# Patient Record
Sex: Male | Born: 1965 | Hispanic: Yes | Smoking: Never smoker
Health system: Southern US, Community
[De-identification: ages and names within clinical notes are randomized; demographics above are authoritative.]

## PROBLEM LIST (undated history)

## (undated) DIAGNOSIS — R945 Abnormal results of liver function studies: Secondary | ICD-10-CM

## (undated) DIAGNOSIS — F32A Depression, unspecified: Secondary | ICD-10-CM

## (undated) DIAGNOSIS — K7460 Unspecified cirrhosis of liver: Secondary | ICD-10-CM

## (undated) DIAGNOSIS — K219 Gastro-esophageal reflux disease without esophagitis: Secondary | ICD-10-CM

## (undated) DIAGNOSIS — R7989 Other specified abnormal findings of blood chemistry: Secondary | ICD-10-CM

## (undated) DIAGNOSIS — E7800 Pure hypercholesterolemia, unspecified: Secondary | ICD-10-CM

## (undated) DIAGNOSIS — F329 Major depressive disorder, single episode, unspecified: Secondary | ICD-10-CM

## (undated) DIAGNOSIS — I10 Essential (primary) hypertension: Secondary | ICD-10-CM

## (undated) DIAGNOSIS — E669 Obesity, unspecified: Secondary | ICD-10-CM

## (undated) DIAGNOSIS — K828 Other specified diseases of gallbladder: Secondary | ICD-10-CM

## (undated) HISTORY — DX: Abnormal results of liver function studies: R94.5

## (undated) HISTORY — DX: Other specified abnormal findings of blood chemistry: R79.89

## (undated) HISTORY — DX: Gastro-esophageal reflux disease without esophagitis: K21.9

## (undated) HISTORY — DX: Obesity, unspecified: E66.9

## (undated) HISTORY — DX: Major depressive disorder, single episode, unspecified: F32.9

## (undated) HISTORY — DX: Other specified diseases of gallbladder: K82.8

## (undated) HISTORY — DX: Essential (primary) hypertension: I10

## (undated) HISTORY — PX: OTHER SURGICAL HISTORY: SHX169

## (undated) HISTORY — DX: Depression, unspecified: F32.A

---

## 2012-01-03 ENCOUNTER — Emergency Department (INDEPENDENT_AMBULATORY_CARE_PROVIDER_SITE_OTHER)
Admission: EM | Admit: 2012-01-03 | Discharge: 2012-01-03 | Disposition: A | Discharge status: Home Health | Payer: Self-pay | Source: Home / Self Care | Attending: Emergency Medicine | Admitting: Emergency Medicine

## 2012-01-03 ENCOUNTER — Emergency Department (HOSPITAL_COMMUNITY)
Admission: EM | Admit: 2012-01-03 | Discharge: 2012-01-03 | Disposition: A | Discharge status: Home / Self Care | Payer: Self-pay | Attending: Emergency Medicine | Admitting: Emergency Medicine

## 2012-01-03 ENCOUNTER — Emergency Department (HOSPITAL_COMMUNITY): Payer: Self-pay

## 2012-01-03 ENCOUNTER — Encounter (HOSPITAL_COMMUNITY): Payer: Self-pay

## 2012-01-03 ENCOUNTER — Other Ambulatory Visit: Payer: Self-pay

## 2012-01-03 DIAGNOSIS — R61 Generalized hyperhidrosis: Secondary | ICD-10-CM | POA: Insufficient documentation

## 2012-01-03 DIAGNOSIS — R748 Abnormal levels of other serum enzymes: Secondary | ICD-10-CM

## 2012-01-03 DIAGNOSIS — R1110 Vomiting, unspecified: Secondary | ICD-10-CM | POA: Insufficient documentation

## 2012-01-03 DIAGNOSIS — R7402 Elevation of levels of lactic acid dehydrogenase (LDH): Secondary | ICD-10-CM | POA: Insufficient documentation

## 2012-01-03 DIAGNOSIS — H11419 Vascular abnormalities of conjunctiva, unspecified eye: Secondary | ICD-10-CM | POA: Insufficient documentation

## 2012-01-03 DIAGNOSIS — R259 Unspecified abnormal involuntary movements: Secondary | ICD-10-CM | POA: Insufficient documentation

## 2012-01-03 DIAGNOSIS — R0602 Shortness of breath: Secondary | ICD-10-CM | POA: Insufficient documentation

## 2012-01-03 DIAGNOSIS — E7800 Pure hypercholesterolemia, unspecified: Secondary | ICD-10-CM | POA: Insufficient documentation

## 2012-01-03 DIAGNOSIS — F1020 Alcohol dependence, uncomplicated: Secondary | ICD-10-CM

## 2012-01-03 DIAGNOSIS — Z7289 Other problems related to lifestyle: Secondary | ICD-10-CM | POA: Insufficient documentation

## 2012-01-03 DIAGNOSIS — R079 Chest pain, unspecified: Secondary | ICD-10-CM | POA: Insufficient documentation

## 2012-01-03 DIAGNOSIS — F10239 Alcohol dependence with withdrawal, unspecified: Secondary | ICD-10-CM

## 2012-01-03 DIAGNOSIS — R7401 Elevation of levels of liver transaminase levels: Secondary | ICD-10-CM | POA: Insufficient documentation

## 2012-01-03 HISTORY — DX: Pure hypercholesterolemia, unspecified: E78.00

## 2012-01-03 LAB — DIFFERENTIAL
Basophils Absolute: 0 10*3/uL (ref 0.0–0.1)
Basophils Relative: 0 % (ref 0–1)
Eosinophils Absolute: 0 10*3/uL (ref 0.0–0.7)
Eosinophils Relative: 0 % (ref 0–5)
Monocytes Absolute: 0.7 10*3/uL (ref 0.1–1.0)
Monocytes Relative: 11 % (ref 3–12)
Neutro Abs: 4.9 10*3/uL (ref 1.7–7.7)

## 2012-01-03 LAB — CBC
HCT: 41.9 % (ref 39.0–52.0)
MCH: 31.6 pg (ref 26.0–34.0)
MCHC: 33.9 g/dL (ref 30.0–36.0)
RDW: 13.9 % (ref 11.5–15.5)

## 2012-01-03 LAB — CARDIAC PANEL(CRET KIN+CKTOT+MB+TROPI)
CK, MB: 2.5 ng/mL (ref 0.3–4.0)
Relative Index: 0.6 (ref 0.0–2.5)
Total CK: 397 U/L — ABNORMAL HIGH (ref 7–232)
Troponin I: <0.3 ng/mL (ref <0.30)

## 2012-01-03 LAB — COMPREHENSIVE METABOLIC PANEL
AST: 80 U/L — ABNORMAL HIGH (ref 0–37)
Albumin: 4.2 g/dL (ref 3.5–5.2)
BUN: 12 mg/dL (ref 6–23)
Calcium: 9.3 mg/dL (ref 8.4–10.5)
Chloride: 96 mEq/L (ref 96–112)
Creatinine, Ser: 0.84 mg/dL (ref 0.50–1.35)
Total Protein: 8.3 g/dL (ref 6.0–8.3)

## 2012-01-03 LAB — RAPID URINE DRUG SCREEN, HOSP PERFORMED
Barbiturates: NOT DETECTED
Benzodiazepines: NOT DETECTED

## 2012-01-03 LAB — D-DIMER, QUANTITATIVE: D-Dimer, Quant: 0.5 ug/mL-FEU — ABNORMAL HIGH (ref 0.00–0.48)

## 2012-01-03 LAB — ETHANOL: Alcohol, Ethyl (B): <11 mg/dL (ref 0–11)

## 2012-01-03 MED ORDER — LORAZEPAM 2 MG/ML IJ SOLN
1.0000 mg | Freq: Once | INTRAMUSCULAR | Status: AC
  Administered 2012-01-03: 1 mg via INTRAMUSCULAR

## 2012-01-03 MED ORDER — ALBUTEROL SULFATE (5 MG/ML) 0.5% IN NEBU
2.5000 mg | INHALATION_SOLUTION | Freq: Once | RESPIRATORY_TRACT | Status: AC
  Administered 2012-01-03: 2.5 mg via RESPIRATORY_TRACT

## 2012-01-03 MED ORDER — LORAZEPAM 2 MG/ML IJ SOLN
INTRAMUSCULAR | Status: AC
  Filled 2012-01-03: qty 1

## 2012-01-03 MED ORDER — LORAZEPAM 2 MG/ML IJ SOLN
2.0000 mg | Freq: Once | INTRAMUSCULAR | Status: AC
  Administered 2012-01-03: 2 mg via INTRAVENOUS
  Filled 2012-01-03: qty 1

## 2012-01-03 MED ORDER — SODIUM CHLORIDE 0.9 % IV BOLUS (SEPSIS)
1000.0000 mL | Freq: Once | INTRAVENOUS | Status: AC
  Administered 2012-01-03: 1000 mL via INTRAVENOUS

## 2012-01-03 MED ORDER — ALBUTEROL SULFATE (5 MG/ML) 0.5% IN NEBU
INHALATION_SOLUTION | RESPIRATORY_TRACT | Status: AC
  Filled 2012-01-03: qty 0.5

## 2012-01-03 NOTE — ED Notes (Signed)
Dr Ladon Applebaum spoke with Shawna Orleans RN at [facility] who is aware to be expecting pt arrival.  Pt departed UC with family who will transport to [facility].

## 2012-01-03 NOTE — ED Provider Notes (Signed)
History     CSN: [id number]  Arrival date & time 01/03/12  5784   First MD Initiated Contact with Patient 01/03/12 0830      Chief Complaint  Patient presents with  . Chest Pain    (Consider location/radiation/quality/duration/timing/severity/associated sxs/prior treatment) HPI Comments: Patient presents urgent care this morning complaining of left-sided chest pain burning in character associated with the feeling nauseous and dizzy with 2-3 episodes of vomiting. Patient also complaining of feeling short of breath and shaking all over. He describes that he was in a barbecue activity yesterday will he was consuming alcohol but had about 6-7 shots. (Father provided more information and describe the patient has been drinking for 3 days and that this is becoming a frequent event)  Family members around him have noticed that his consumption of alcohol has increased and when he goes hours without drinking he gets moody. Patient is complaining also of a headache. Patient described that he couldn't sleep last night he was jittery and shaking all over the whole night.   As patient presents to urgent care with concerns of chest pains and shortness of breath initial assessments were targeting the potential presence of a coronary event where EKG revealed no acute signs of a coronary event. Further during his observation monitor was noted that patient had other symptoms and a half been drinking recurrently for the last couple days. Patient looks anxious and with rest tremors predominantly on his upper extremities.   Patient is a 46 y.o. male presenting with chest pain. The history is provided by the patient.  Chest Pain The chest pain began 6 - 12 hours ago. Chest pain occurs constantly. The pain is associated with breathing. At its most intense, the pain is at 7/10. The quality of the pain is described as burning. The pain radiates to the precordial region. Chest pain is worsened by certain positions.  Primary symptoms include shortness of breath, nausea, vomiting and dizziness. Pertinent negatives for primary symptoms include no fever, no fatigue, no cough, no wheezing and no altered mental status.  Dizziness also occurs with nausea, vomiting, weakness and diaphoresis.  Associated symptoms include diaphoresis and weakness. He tried nothing for the symptoms. Risk factors include alcohol intake.  Pertinent negatives for past medical history include no anxiety/panic attacks, no arrhythmia, no MI, no spontaneous pneumothorax and no strokes.     Past Medical History  Diagnosis Date  . Hypercholesteremia     History reviewed. No pertinent past surgical history.  No family history on file.  History  Substance Use Topics  . Smoking status: Never Smoker   . Smokeless tobacco: Not on file  . Alcohol Use: Yes     6 shots of whiskey per day.        Review of Systems  Constitutional: Positive for diaphoresis. Negative for fever, chills, activity change, appetite change and fatigue.  Respiratory: Positive for shortness of breath. Negative for cough, chest tightness and wheezing.   Cardiovascular: Positive for chest pain.  Gastrointestinal: Positive for nausea and vomiting.  Neurological: Positive for dizziness, tremors, weakness and headaches. Negative for light-headedness.  Psychiatric/Behavioral: Positive for sleep disturbance, decreased concentration and agitation. Negative for altered mental status. The patient is nervous/anxious and is hyperactive.     Allergies  Review of patient's allergies indicates no known allergies.  Home Medications  No current outpatient prescriptions on file.  BP 159/92  Pulse 117  Temp(Src) 98.7 F (37.1 C) (Oral)  Resp 22  Ht 5\' 9"  (1.753  m)  Wt 240 lb (108.863 kg)  BMI 35.44 kg/m2  SpO2 96%  Physical Exam  Nursing note and vitals reviewed. Constitutional: He appears distressed. He is not intubated.  HENT:  Head: Normocephalic.  Eyes: No  foreign bodies found. Right conjunctiva is injected. Left conjunctiva is injected.  Neck: Trachea normal. No JVD present. Normal range of motion present.  Cardiovascular: Regular rhythm.   No extrasystoles are present. Tachycardia present.        EKG reveals sinus tachycardia at the ranges from 100-106 normal intervals and segments some isolated PACs  Pulmonary/Chest: Effort normal and breath sounds normal. No accessory muscle usage. No apnea. He is not intubated. No respiratory distress. He has no decreased breath sounds. He has no wheezes. He has no rhonchi. He has no rales. He exhibits tenderness. He exhibits no mass, no crepitus, no deformity, no swelling and no retraction.    Abdominal: Soft. Normal appearance. There is no hepatosplenomegaly. There is no tenderness. There is no rigidity, no rebound and no guarding.    ED Course  Procedures (including critical care time)  Labs Reviewed - No data to display No results found.   1. Alcohol dependence with withdrawal       MDM  Discussed case with Melanie at the psych unit- patient exhibiting signs of alcohol withdrawal scored about 20 points and the CIWA score. At this point I doubt that his chest discomfort is related to a coronary event his pattern of discomfort is more a paresthesia-type symptom with marked left chest wall discomfort with mild palpation.        Jimmie Molly, MD 01/03/12 [phone]

## 2012-01-03 NOTE — Discharge Instructions (Signed)
Please read and follow all provided instructions.  Your diagnoses today include:  1. Shortness of breath   2. Chest pain   3. Alcohol use     Tests performed today include:  Chest x-ray - normal  Blood tests for the heart - normal  EKG - normal  Vital signs. See below for your results today.   Medications prescribed:   None  Home care instructions:  Hydrate well for the next several days and get rest.  Avoid alcohol.   Follow-up instructions: Please follow-up with your primary care provider in the next 3 days for further evaluation of your symptoms. If you do not have a primary care doctor -- see below for referral information.   Return instructions:   Please return to the Emergency Department if you experience worsening symptoms.   Please return if you have any other emergent concerns.  Additional Information:  Your vital signs today were: BP 154/98  Pulse 101  Temp(Src) 98.1 F (36.7 C) (Oral)  Resp 23  SpO2 100% If your blood pressure (BP) was elevated above 135/85 this visit, please have this repeated by your doctor within one month. -------------- No Primary Care Doctor Call [facility]  [phone] Other agencies that provide inexpensive medical care    [facility]  [phone]    [facility]  [phone]    [facility]  [phone]    [facility]  [phone]    [facility]  [phone]    [facility]  [phone] -------------- RESOURCE GUIDE:  Dental Problems  Patients with Medicaid: [facility] [phone] [address]                                            [phone] [address] Phone:  [phone]                                                   Phone:  [phone]  If unable to pay or uninsured, contact:  [facility] or [facility]. to become qualified for the adult dental clinic.  Chronic Pain Problems Contact [facility]  [phone] Patients need to be referred by their primary care doctor.  Insufficient Money for Medicine Contact [employer]:  call "211" or [facility] [phone].  Psychological Services [facility]  [phone] [facility]  [phone] [facility]   [phone] (emergency services [phone])  Substance Abuse Resources [facility]  [phone] [facility] [phone] [facility] [place] [phone] Floydene Flock [phone] Residential & Outpatient Substance Abuse Program  [phone]  Abuse/Neglect [facility] Child Abuse Hotline 671-760-0405 [facility] Child Abuse Hotline [phone] (After Hours)  Emergency Shelter [facility] [phone]  M[facility] Homes [facility] [phone] [place] [facility] [phone]  [facility]  [facility] [facility] [address] [place]                       [address]      [address]  [place]                                                [place]                            [place] Phone:  [phone]                                   Phone:  [phone]                 Phone:  [phone]  [place] [facility] Phone:  [phone]  [place] Child Abuse Hotline [phone] (After Hours)    

## 2012-01-03 NOTE — ED Notes (Signed)
Patient reports that he began having chest pain with N/V, dizziness, SOB, weakness, and diaphoresis at 2000 01/02/12. Patient reports that the chest pain was so bad that he could not sleep. Patient decided to go to Urgent Care this AM because the pain did not go away. Patient was told to come to the ED.

## 2012-01-03 NOTE — ED Provider Notes (Signed)
Medical screening examination/treatment/procedure(s) were conducted as a shared visit with non-physician practitioner(s) and myself.  I personally evaluated the patient during the encounter  Flint Melter, MD 01/03/12 2050

## 2012-01-03 NOTE — Progress Notes (Signed)
During 01/03/12 visit Pt listed as self pay with no insurance coverage Pt confirms he is self pay [place] resident.  CM and [facility] community liaison spoke with him Pt offered [facility] services to assist with finding a [place] self pay provider Referred to general medical

## 2012-01-03 NOTE — Discharge Instructions (Signed)
   We discussed at length the need for you to meet with our behavioral team today. As your exhibiting some signs of alcohol withdrawal, there is a need to observe you and monitor for a nonspecific amount of time. We have have informed our colleagues of your arrival shortly to the [facility] long psychiatric unit.    Alcohol Withdrawal Alcohol withdrawal happens when you normally drink alcohol a lot and suddenly stop drinking. Alcohol withdrawal symptoms can be mild to very bad. Mild withdrawal symptoms can include feeling sick to your stomach (nauseous), headache, or feeling irritable. Bad withdrawal symptoms can include shakiness, being very nervous (anxious), and not thinking clearly.  HOME CARE  Join an alcohol support group.   Stay away from people or situations that make you want to drink.   Eat a healthy diet. Eat a lot of fresh fruits, vegetables, and lean meats.  GET HELP RIGHT AWAY IF:   You become confused. You start to see and hear things that are not really there.   You feel your heart beating very fast.   You throw up (vomit) blood or cannot stop throwing up. This may be bright red or look like black coffee grounds.   You have blood in your poop (stool). This may be bright red, maroon colored, or black and tarry.   You are lightheaded or pass out (faint).   You develop a fever.  MAKE SURE YOU:   Understand these instructions.   Will watch your condition.   Will get help right away if you are not doing well or get worse.  Document Released: 02/02/2008 Document Revised: 08/05/2011 Document Reviewed: 02/02/2008 [facility] Patient Information 2012 [place], [place].

## 2012-01-03 NOTE — Progress Notes (Signed)
ED Cm left voice message for spanish interpreter at [phone] at 1230 but no response noted

## 2012-01-03 NOTE — ED Provider Notes (Signed)
Medical screening examination/treatment/procedure(s) were conducted as a shared visit with non-physician practitioner(s) and myself.  I personally evaluated the patient during the encounter  Patient is alert, non-tremulous and cooperative.  Repeat vitals normal Lungs clear. Cor- mild tachycardia Neuro-nonfocal. A&O x 3   Flint Melter, MD 01/03/12 1536

## 2012-01-03 NOTE — ED Notes (Signed)
Pt continues to have tremors.  Pt admits to drinking 5-6 whiskey drinks daily.  Father present in room and states his consumption is far greater.

## 2012-01-03 NOTE — ED Provider Notes (Signed)
History     CSN: [id number]  Arrival date & time 01/03/12  1003   First MD Initiated Contact with Patient 01/03/12 1042      Chief Complaint  Patient presents with  . Shortness of Breath  . Chest Pain    (Consider location/radiation/quality/duration/timing/severity/associated sxs/prior treatment) HPI Comments: Patient sent from [facility] with complaint of shortness of breath and chest pain. Patient states he has been feeling the symptoms to some extent for approximately one week however they became worse this morning. At urgent care, history was suspicious for alcohol withdrawal. Patient is having shortness of breath, shakiness, sweating, and chest pain. He had several episodes of vomiting this morning. Patient admits to drinking a large amount of alcohol (12 oz of whiskey) yesterday and 2 days ago. Patient states that he does not drink every day. He denies recent immobilizations, leg swelling, history of blood clots. Denies history of cardiac problems. Patient states he had a lung problem approximately 20 years ago when he lived in [place] however does not know what this was.  Patient is a 46 y.o. male presenting with shortness of breath and chest pain. The history is provided by the patient.  Shortness of Breath  The current episode started today. The onset was gradual. The problem has been unchanged. The symptoms are relieved by nothing. The symptoms are aggravated by nothing. Associated symptoms include chest pain and shortness of breath. Pertinent negatives include no fever, no rhinorrhea, no sore throat, no cough and no wheezing.  Chest Pain Primary symptoms include shortness of breath. Pertinent negatives for primary symptoms include no fever, no cough, no wheezing, no abdominal pain, no nausea and no vomiting.     Past Medical History  Diagnosis Date  . Hypercholesteremia     History reviewed. No pertinent past surgical history.  Family History  Problem Relation  Age of Onset  . Hyperlipidemia Father     History  Substance Use Topics  . Smoking status: Never Smoker   . Smokeless tobacco: Not on file  . Alcohol Use: Yes     6 shots of whiskey per day.        Review of Systems  Constitutional: Negative for fever.  HENT: Negative for sore throat and rhinorrhea.   Eyes: Negative for photophobia and visual disturbance.  Respiratory: Positive for shortness of breath. Negative for cough and wheezing.   Cardiovascular: Positive for chest pain. Negative for leg swelling.  Gastrointestinal: Negative for nausea, vomiting, abdominal pain and diarrhea.  Genitourinary: Negative for dysuria.  Musculoskeletal: Negative for myalgias.  Skin: Negative for rash.  Neurological: Negative for headaches.    Allergies  Review of patient's allergies indicates no known allergies.  Home Medications  No current outpatient prescriptions on file.  BP 155/90  Temp(Src) 98.1 F (36.7 C) (Oral)  Resp 22  SpO2 97%  Physical Exam  Nursing note and vitals reviewed. Constitutional: He is oriented to person, place, and time. He appears well-developed and well-nourished.  HENT:  Head: Normocephalic and atraumatic.  Eyes: Right eye exhibits no discharge. Left eye exhibits no discharge. Right conjunctiva is injected. Right conjunctiva has no hemorrhage. Left conjunctiva is injected. Left conjunctiva has no hemorrhage.  Neck: Normal range of motion. Neck supple.  Cardiovascular: Normal rate, regular rhythm and normal heart sounds.   No murmur heard. Pulmonary/Chest: Effort normal and breath sounds normal. No respiratory distress.  Abdominal: Soft. There is no tenderness.  Neurological: He is alert and oriented to person, place,  and time. He has normal strength. No cranial nerve deficit or sensory deficit. Coordination normal. GCS eye subscore is 4. GCS verbal subscore is 5. GCS motor subscore is 6.  Skin: Skin is warm and dry.  Psychiatric: He has a normal mood and  affect.    ED Course  Procedures (including critical care time)  Labs Reviewed  CBC - Abnormal; Notable for the following:    Platelets 122 (*)    All other components within normal limits  COMPREHENSIVE METABOLIC PANEL - Abnormal; Notable for the following:    Glucose, Bld 102 (*)    AST 80 (*)    ALT 117 (*)    All other components within normal limits  D-DIMER, QUANTITATIVE - Abnormal; Notable for the following:    D-Dimer, Quant 0.50 (*)    All other components within normal limits  CARDIAC PANEL(CRET KIN+CKTOT+MB+TROPI) - Abnormal; Notable for the following:    Total CK 397 (*)    All other components within normal limits  DIFFERENTIAL  ETHANOL  URINE RAPID DRUG SCREEN (HOSP PERFORMED)   Dg Chest 2 View  01/03/2012  *RADIOLOGY REPORT*  Clinical Data: Chest pain with shortness of breath, nausea and vomiting.  CHEST - 2 VIEW  Comparison: None.  Findings: The heart size and mediastinal contours are normal. There are low lung volumes.  No confluent airspace opacity, pleural effusion or pneumothorax is seen.  There are scattered osteophytes of the thoracic spine.  No acute osseous findings are seen. Several telemetry leads overlie the chest.  IMPRESSION: Suboptimal inspiration.  No acute cardiopulmonary process.  Original Report Authenticated By: Gerrianne Scale, M.D.     1. Shortness of breath   2. Chest pain   3. Alcohol use   4. Elevated liver enzymes     11:07 AM Patient seen and examined. Work-up initiated. Medications ordered. UCC visit reviewed. Will treat for EtOH withdrawal as well as eval for chest pain.   Vital signs reviewed and are as follows: Filed Vitals:   01/03/12 1028  BP: 155/90  Temp: 98.1 F (36.7 C)  Resp: 22    Date: 01/03/2012  Rate: 110  Rhythm: sinus tachycardia  QRS Axis: normal  Intervals: normal  ST/T Wave abnormalities: normal  Conduction Disutrbances:none  Narrative Interpretation:   Old EKG Reviewed: unchanged from previous EKG  today  3:22 PM Patient was re-examined earlier. He is feeling much better after ativan. I have discussed patient and results with Dr. Effie Shy who will see patient.   Dr. Effie Shy saw patient. No further treatment indicated. Will d/c to home with referrals.  Results reviewed with patient and family. Counseled patient to avoid alcohol. Counseled to rest and hydrate well. HR was 94-98 after fluids.   Patient was counseled to return with severe chest pain, especially if the pain is crushing or pressure-like and spreads to the arms, back, neck, or jaw, or if they have sweating, nausea, or shortness of breath with the pain. They were encouraged to call 911 with these symptoms.   They were also told to return if their chest pain gets worse and does not go away with rest, they have an attack of chest pain lasting longer than usual despite rest and treatment with the medications their caregiver has prescribed, if they wake from sleep with chest pain or shortness of breath, if they feel dizzy or faint, if they have chest pain not typical of their usual pain, or if they have any other emergent concerns regarding their  health.  The patient verbalized understanding and agreed.   MDM  Patient with CP, tachycardia, SOB. Recent heavy EtOH use. Symptoms resolved with ativan. Cardiac work-up is negative. D-dimer just slightly high. DO NOT suspect PE, patient has no risk factors and has other etiology of tachycardia. Patient appears well at time of discharge. Vitals improved with ativan and fluids.        Renne Crigler, [place] 01/03/12 2035

## 2012-01-03 NOTE — ED Notes (Addendum)
PT c/o CP onset 1 week ago, worsening last night and became worse this morning.  Pt states he has felt SOB.  Pt shaking upon arrival.  EKG completed upon arrival.  Pt extremely tender to palpation in L chest.

## 2013-07-24 ENCOUNTER — Ambulatory Visit: Payer: Self-pay | Attending: Internal Medicine

## 2013-11-20 ENCOUNTER — Ambulatory Visit: Payer: Self-pay | Admitting: Internal Medicine

## 2013-12-14 ENCOUNTER — Encounter: Payer: Self-pay | Admitting: Internal Medicine

## 2013-12-14 ENCOUNTER — Ambulatory Visit: Payer: Self-pay | Attending: Internal Medicine | Admitting: Internal Medicine

## 2013-12-14 VITALS — BP 147/92 | HR 92 | Temp 99.2°F | Resp 16 | Wt 238.4 lb

## 2013-12-14 DIAGNOSIS — E7800 Pure hypercholesterolemia, unspecified: Secondary | ICD-10-CM | POA: Insufficient documentation

## 2013-12-14 DIAGNOSIS — E669 Obesity, unspecified: Secondary | ICD-10-CM | POA: Insufficient documentation

## 2013-12-14 DIAGNOSIS — IMO0001 Reserved for inherently not codable concepts without codable children: Secondary | ICD-10-CM | POA: Insufficient documentation

## 2013-12-14 DIAGNOSIS — Z008 Encounter for other general examination: Secondary | ICD-10-CM | POA: Insufficient documentation

## 2013-12-14 DIAGNOSIS — Z139 Encounter for screening, unspecified: Secondary | ICD-10-CM

## 2013-12-14 DIAGNOSIS — R030 Elevated blood-pressure reading, without diagnosis of hypertension: Secondary | ICD-10-CM | POA: Insufficient documentation

## 2013-12-14 NOTE — Progress Notes (Signed)
Patient here to establish care Takes no prescribed medications

## 2013-12-14 NOTE — Progress Notes (Signed)
Patient Demographics  Eleanor Dimichele, is a 48 y.o. male  YIR:[id number]  JKK:[id number]  DOB - 21-Sep-1965  CC:  Chief Complaint  Patient presents with  . Establish Care       HPI: Wandell Scullion is a 48 y.o. male here today to establish medical care. Patient's blood pressure is elevated patient denies any headache dizziness chest and shortness of breath, patient is trying to lose weight. Patient denies smoking cigarettes. Patient has No headache, No chest pain, No abdominal pain - No Nausea, No new weakness tingling or numbness, No Cough - SOB.  No Known Allergies Past Medical History  Diagnosis Date  . Hypercholesteremia    No current outpatient prescriptions on file prior to visit.   No current facility-administered medications on file prior to visit.   Family History  Problem Relation Age of Onset  . Hyperlipidemia Father   . Hyperlipidemia Mother    History   Social History  . Marital Status: Single    Spouse Name: N/A    Number of Children: N/A  . Years of Education: N/A   Occupational History  . Not on file.   Social History Main Topics  . Smoking status: Never Smoker   . Smokeless tobacco: Not on file  . Alcohol Use: Yes     Comment: 6 shots of whiskey per day.    . Drug Use: No  . Sexual Activity: Not on file   Other Topics Concern  . Not on file   Social History Narrative  . No narrative on file    Review of Systems: Constitutional: Negative for fever, chills, diaphoresis, activity change, appetite change and fatigue. HENT: Negative for ear pain, nosebleeds, congestion, facial swelling, rhinorrhea, neck pain, neck stiffness and ear discharge.  Eyes: Negative for pain, discharge, redness, itching and visual disturbance. Respiratory: Negative for cough, choking, chest tightness, shortness of breath, wheezing and stridor.  Cardiovascular: Negative for chest pain, palpitations and leg swelling. Gastrointestinal: Negative for abdominal  distention. Genitourinary: Negative for dysuria, urgency, frequency, hematuria, flank pain, decreased urine volume, difficulty urinating and dyspareunia.  Musculoskeletal: Negative for back pain, joint swelling, arthralgia and gait problem. Neurological: Negative for dizziness, tremors, seizures, syncope, facial asymmetry, speech difficulty, weakness, light-headedness, numbness and headaches.  Hematological: Negative for adenopathy. Does not bruise/bleed easily. Psychiatric/Behavioral: Negative for hallucinations, behavioral problems, confusion, dysphoric mood, decreased concentration and agitation.    Objective:   Filed Vitals:   12/14/13 1524  BP: 147/92  Pulse: 92  Temp: 99.2 F (37.3 C)  Resp: 16    Physical Exam: Constitutional: Obese male sitting comfortably not in acute distress HENT: Normocephalic, atraumatic, External right and left ear normal. Oropharynx is clear and moist.  Eyes: Conjunctivae and EOM are normal. PERRLA, no scleral icterus. Neck: Normal ROM. Neck supple. No JVD. No tracheal deviation. No thyromegaly. CVS: RRR, S1/S2 +, no murmurs, no gallops, no carotid bruit.  Pulmonary: Effort and breath sounds normal, no stridor, rhonchi, wheezes, rales.  Abdominal: Soft. BS +, no distension, tenderness, rebound or guarding.  Neuro: Alert. Normal reflexes, muscle tone coordination. No cranial nerve deficit. Skin: Skin is warm and dry. No rash noted. Not diaphoretic. No erythema. No pallor. Psychiatric: Normal mood and affect. Behavior, judgment, thought content normal.  Lab Results  Component Value Date   WBC 6.5 01/03/2012   HGB 14.2 01/03/2012   HCT 41.9 01/03/2012   MCV 93.1 01/03/2012   PLT 122* 01/03/2012   Lab Results  Component Value Date  CREATININE 0.84 01/03/2012   BUN 12 01/03/2012   NA 136 01/03/2012   K 3.7 01/03/2012   CL 96 01/03/2012   CO2 24 01/03/2012    No results found for this basename: HGBA1C   Lipid Panel  No results found for this basename: chol,  trig, hdl, cholhdl, vldl, ldlcalc       Assessment and plan:   1. Obesity, unspecified  Diet and exercise  2. Elevated BP Advised patient for DASH diet, exercise and lose weight.   3. Screening  Baseline fasting blood work .  - CBC with Differential; Future - COMPLETE METABOLIC PANEL WITH GFR; Future - Lipid panel; Future - TSH; Future - Vit D  25 hydroxy (rtn osteoporosis monitoring); Future   Return in about 3 months (around 03/15/2014) for hypertension.  Lorayne Marek, MD

## 2013-12-14 NOTE — Patient Instructions (Signed)
DASH Diet  The DASH diet stands for "Dietary Approaches to Stop Hypertension." It is a healthy eating plan that has been shown to reduce high blood pressure (hypertension) in as little as 14 days, while also possibly providing other significant health benefits. These other health benefits include reducing the risk of breast cancer after menopause and reducing the risk of type 2 diabetes, heart disease, colon cancer, and stroke. Health benefits also include weight loss and slowing kidney failure in patients with chronic kidney disease.   DIET GUIDELINES  · Limit salt (sodium). Your diet should contain less than 1500 mg of sodium daily.  · Limit refined or processed carbohydrates. Your diet should include mostly whole grains. Desserts and added sugars should be used sparingly.  · Include small amounts of heart-healthy fats. These types of fats include nuts, oils, and tub margarine. Limit saturated and trans fats. These fats have been shown to be harmful in the body.  CHOOSING FOODS   The following food groups are based on a 2000 calorie diet. See your Registered Dietitian for individual calorie needs.  Grains and Grain Products (6 to 8 servings daily)  · Eat More Often: Whole-wheat bread, brown rice, whole-grain or wheat pasta, quinoa, popcorn without added fat or salt (air popped).  · Eat Less Often: White bread, white pasta, white rice, cornbread.  Vegetables (4 to 5 servings daily)  · Eat More Often: Fresh, frozen, and canned vegetables. Vegetables may be raw, steamed, roasted, or grilled with a minimal amount of fat.  · Eat Less Often/Avoid: Creamed or fried vegetables. Vegetables in a cheese sauce.  Fruit (4 to 5 servings daily)  · Eat More Often: All fresh, canned (in natural juice), or frozen fruits. Dried fruits without added sugar. One hundred percent fruit juice (½ cup [237 mL] daily).  · Eat Less Often: Dried fruits with added sugar. Canned fruit in light or heavy syrup.  Lean Meats, Fish, and Poultry (2  servings or less daily. One serving is 3 to 4 oz [85-114 g]).  · Eat More Often: Ninety percent or leaner ground beef, tenderloin, sirloin. Round cuts of beef, chicken breast, turkey breast. All fish. Grill, bake, or broil your meat. Nothing should be fried.  · Eat Less Often/Avoid: Fatty cuts of meat, turkey, or chicken leg, thigh, or wing. Fried cuts of meat or fish.  Dairy (2 to 3 servings)  · Eat More Often: Low-fat or fat-free milk, low-fat plain or light yogurt, reduced-fat or part-skim cheese.  · Eat Less Often/Avoid: Milk (whole, 2%). Whole milk yogurt. Full-fat cheeses.  Nuts, Seeds, and Legumes (4 to 5 servings per week)  · Eat More Often: All without added salt.  · Eat Less Often/Avoid: Salted nuts and seeds, canned beans with added salt.  Fats and Sweets (limited)  · Eat More Often: Vegetable oils, tub margarines without trans fats, sugar-free gelatin. Mayonnaise and salad dressings.  · Eat Less Often/Avoid: Coconut oils, palm oils, butter, stick margarine, cream, half and half, cookies, candy, pie.  FOR MORE INFORMATION  The Dash Diet Eating Plan: www.dashdiet.org  Document Released: 08/05/2011 Document Revised: 11/08/2011 Document Reviewed: 08/05/2011  ExitCare® Patient Information ©2014 [employer].

## 2013-12-17 ENCOUNTER — Ambulatory Visit: Payer: Self-pay | Attending: Internal Medicine

## 2013-12-17 DIAGNOSIS — Z139 Encounter for screening, unspecified: Secondary | ICD-10-CM

## 2013-12-17 LAB — CBC WITH DIFFERENTIAL/PLATELET
BASOS PCT: 1 % (ref 0–1)
Basophils Absolute: 0.1 10*3/uL (ref 0.0–0.1)
EOS ABS: 0.2 10*3/uL (ref 0.0–0.7)
Eosinophils Relative: 2 % (ref 0–5)
HCT: 40.3 % (ref 39.0–52.0)
HEMOGLOBIN: 13.9 g/dL (ref 13.0–17.0)
Lymphocytes Relative: 15 % (ref 12–46)
Lymphs Abs: 1.2 10*3/uL (ref 0.7–4.0)
MCH: 30.8 pg (ref 26.0–34.0)
MCHC: 34.5 g/dL (ref 30.0–36.0)
MCV: 89.4 fL (ref 78.0–100.0)
MONOS PCT: 10 % (ref 3–12)
Monocytes Absolute: 0.8 10*3/uL (ref 0.1–1.0)
NEUTROS PCT: 72 % (ref 43–77)
Neutro Abs: 5.5 10*3/uL (ref 1.7–7.7)
PLATELETS: 135 10*3/uL — AB (ref 150–400)
RBC: 4.51 MIL/uL (ref 4.22–5.81)
RDW: 15.2 % (ref 11.5–15.5)
WBC: 7.7 10*3/uL (ref 4.0–10.5)

## 2013-12-17 LAB — LIPID PANEL
CHOL/HDL RATIO: 4.8 ratio
CHOLESTEROL: 206 mg/dL — AB (ref 0–200)
HDL: 43 mg/dL (ref >39)
LDL Cholesterol: 134 mg/dL — ABNORMAL HIGH (ref 0–99)
TRIGLYCERIDES: 147 mg/dL (ref <150)
VLDL: 29 mg/dL (ref 0–40)

## 2013-12-17 LAB — TSH: TSH: 2.724 u[IU]/mL (ref 0.350–4.500)

## 2013-12-17 LAB — COMPLETE METABOLIC PANEL WITH GFR
ALK PHOS: 67 U/L (ref 39–117)
ALT: 108 U/L — ABNORMAL HIGH (ref 0–53)
AST: 63 U/L — ABNORMAL HIGH (ref 0–37)
Albumin: 4.1 g/dL (ref 3.5–5.2)
BILIRUBIN TOTAL: 0.9 mg/dL (ref 0.2–1.2)
BUN: 9 mg/dL (ref 6–23)
CO2: 27 mEq/L (ref 19–32)
CREATININE: 0.93 mg/dL (ref 0.50–1.35)
Calcium: 8.8 mg/dL (ref 8.4–10.5)
Chloride: 103 mEq/L (ref 96–112)
GFR, Est African American: >89 mL/min
GLUCOSE: 76 mg/dL (ref 70–99)
Potassium: 4.5 mEq/L (ref 3.5–5.3)
SODIUM: 138 meq/L (ref 135–145)
TOTAL PROTEIN: 7.2 g/dL (ref 6.0–8.3)

## 2013-12-18 ENCOUNTER — Telehealth: Payer: Self-pay | Admitting: *Deleted

## 2013-12-18 ENCOUNTER — Other Ambulatory Visit: Payer: Self-pay | Admitting: *Deleted

## 2013-12-18 ENCOUNTER — Other Ambulatory Visit: Payer: Self-pay | Admitting: Internal Medicine

## 2013-12-18 DIAGNOSIS — E559 Vitamin D deficiency, unspecified: Secondary | ICD-10-CM

## 2013-12-18 DIAGNOSIS — R7989 Other specified abnormal findings of blood chemistry: Secondary | ICD-10-CM

## 2013-12-18 DIAGNOSIS — R945 Abnormal results of liver function studies: Secondary | ICD-10-CM

## 2013-12-18 LAB — VITAMIN D 25 HYDROXY (VIT D DEFICIENCY, FRACTURES): Vit D, 25-Hydroxy: 26 ng/mL — ABNORMAL LOW (ref 30–89)

## 2013-12-18 MED ORDER — VITAMIN D (ERGOCALCIFEROL) 1.25 MG (50000 UNIT) PO CAPS: 50000.0000 [IU] | ORAL_CAPSULE | ORAL | Status: DC

## 2013-12-18 NOTE — Telephone Encounter (Signed)
Pt is aware of his lab results. I made him an appointment for labs and prescribed the vitamin D RX

## 2013-12-18 NOTE — Telephone Encounter (Signed)
Message copied by Joan Mayans on Tue Dec 18, 2013  3:49 PM ------      Message from: Lorayne Marek      Created: Tue Dec 18, 2013 11:22 AM       Blood work reviewed, noticed low vitamin D, call patient advise to start ergocalciferol 50,000 units once a week for the duration of  12 weeks.      Also noticed abnormal liver function test, advise patient to avoid alcohol and Tylenol, I have ordered hepatitis panel, advise patient to do the blood work this week ------

## 2013-12-19 ENCOUNTER — Telehealth: Payer: Self-pay | Admitting: Internal Medicine

## 2013-12-19 NOTE — Telephone Encounter (Signed)
Pt called and left a voicemail stating that he was returning a phone call that he has received from the nurse. Pt would like to know his blood results. Please contact pt

## 2013-12-20 ENCOUNTER — Telehealth: Payer: Self-pay

## 2013-12-20 NOTE — Telephone Encounter (Signed)
Message copied by Dorothe Pea on Thu Dec 20, 2013  4:45 PM ------      Message from: Lorayne Marek      Created: Tue Dec 18, 2013 11:22 AM       Blood work reviewed, noticed low vitamin D, call patient advise to start ergocalciferol 50,000 units once a week for the duration of  12 weeks.      Also noticed abnormal liver function test, advise patient to avoid alcohol and Tylenol, I have ordered hepatitis panel, advise patient to do the blood work this week ------

## 2013-12-21 ENCOUNTER — Ambulatory Visit: Payer: Self-pay | Attending: Internal Medicine

## 2013-12-21 DIAGNOSIS — R7989 Other specified abnormal findings of blood chemistry: Secondary | ICD-10-CM

## 2013-12-21 DIAGNOSIS — R945 Abnormal results of liver function studies: Secondary | ICD-10-CM

## 2013-12-22 LAB — HEPATITIS B SURFACE ANTIBODY,QUALITATIVE: Hep B S Ab: NEGATIVE

## 2013-12-22 LAB — HEPATITIS C ANTIBODY: HCV AB: NEGATIVE

## 2013-12-22 LAB — HEPATITIS B SURFACE ANTIGEN: Hepatitis B Surface Ag: NEGATIVE

## 2013-12-31 ENCOUNTER — Telehealth: Payer: Self-pay

## 2013-12-31 NOTE — Telephone Encounter (Signed)
Patient not available Left message on voice mail to return our call 

## 2013-12-31 NOTE — Telephone Encounter (Signed)
Message copied by Dorothe Pea on Mon Dec 31, 2013  4:47 PM ------      Message from: Lorayne Marek      Created: Mon Dec 31, 2013 12:01 PM       Blood work reviewed , call and let the patient know that his hepatitis panel is negative, abnormal LFTs can be secondary to alcohol intake, advise patient to avoid alcohol and Tylenol. ------

## 2013-12-31 NOTE — Telephone Encounter (Signed)
Message copied by Dorothe Pea on Mon Dec 31, 2013  4:44 PM ------      Message from: Lorayne Marek      Created: Mon Dec 31, 2013 12:01 PM       Blood work reviewed , call and let the patient know that his hepatitis panel is negative, abnormal LFTs can be secondary to alcohol intake, advise patient to avoid alcohol and Tylenol. ------

## 2014-01-14 ENCOUNTER — Ambulatory Visit: Payer: Self-pay | Attending: Internal Medicine | Admitting: Internal Medicine

## 2014-01-14 VITALS — BP 132/88 | HR 80 | Wt 236.4 lb

## 2014-01-14 DIAGNOSIS — E559 Vitamin D deficiency, unspecified: Secondary | ICD-10-CM

## 2014-01-14 NOTE — Progress Notes (Signed)
Patient states he has been feeling dizziness, abdominal "burning" and joint aching following his Vit D dosage. Takes the dose at noon on Fridays and wakes the following AM with these symptoms and states they last 24-48 Spoke with Provider and Patient advised to stop the RX and take OTC Vit D 2000 IU QD. Patient verbalizes understanding.  Agree with RN's assessment and intervention  Lorayne Marek, MD

## 2014-03-13 ENCOUNTER — Encounter: Payer: Self-pay | Admitting: Internal Medicine

## 2014-03-13 ENCOUNTER — Ambulatory Visit: Payer: Self-pay | Attending: Internal Medicine | Admitting: Internal Medicine

## 2014-03-13 VITALS — BP 136/91 | HR 83 | Temp 98.2°F | Resp 16 | Wt 232.2 lb

## 2014-03-13 DIAGNOSIS — R7989 Other specified abnormal findings of blood chemistry: Secondary | ICD-10-CM

## 2014-03-13 DIAGNOSIS — R945 Abnormal results of liver function studies: Secondary | ICD-10-CM | POA: Insufficient documentation

## 2014-03-13 DIAGNOSIS — E669 Obesity, unspecified: Secondary | ICD-10-CM | POA: Insufficient documentation

## 2014-03-13 DIAGNOSIS — E559 Vitamin D deficiency, unspecified: Secondary | ICD-10-CM | POA: Insufficient documentation

## 2014-03-13 DIAGNOSIS — I10 Essential (primary) hypertension: Secondary | ICD-10-CM | POA: Insufficient documentation

## 2014-03-13 MED ORDER — AMLODIPINE BESYLATE 5 MG PO TABS: 5.0000 mg | ORAL_TABLET | Freq: Every day | ORAL | Status: DC

## 2014-03-13 NOTE — Progress Notes (Signed)
MRN: [id number] Name: Jesse Esparza  Sex: male Age: 48 y.o. DOB: [birth year]  Allergies: Review of patient's allergies indicates no known allergies.  Chief Complaint  Patient presents with  . Follow-up    HPI: Patient is 48 y.o. male who comes today for followup, previous blood work reviewed with the patient noticed abnormal LFTs, patient had hepatitis panel done which was negative, patient denies any abdominal pain nausea vomiting change in bowel habits, as per patient his blood pressure has been running high and systolic was and [id number] as per patient he took his moms blood pressure medication, patient is also trying to lose weight, he has lost 6 pounds compared to last visit. Patient also had low vitamin D and is taking the supplement.   Past Medical History  Diagnosis Date  . Hypercholesteremia     History reviewed. No pertinent past surgical history.    Medication List       This list is accurate as of: 03/13/14  9:32 AM.  Always use your most recent med list.               amLODipine 5 MG tablet  Commonly known as:  NORVASC  Take 1 tablet (5 mg total) by mouth daily.     Vitamin D (Ergocalciferol) 50000 UNITS Caps capsule  Commonly known as:  DRISDOL  Take 1 capsule (50,000 Units total) by mouth every 7 (seven) days.        Meds ordered this encounter  Medications  . amLODipine (NORVASC) 5 MG tablet    Sig: Take 1 tablet (5 mg total) by mouth daily.    Dispense:  90 tablet    Refill:  1     There is no immunization history on file for this patient.  Family History  Problem Relation Age of Onset  . Hyperlipidemia Father   . Hyperlipidemia Mother     History  Substance Use Topics  . Smoking status: Never Smoker   . Smokeless tobacco: Not on file  . Alcohol Use: Yes     Comment: 6 shots of whiskey per day.      Review of Systems   As noted in HPI  Filed Vitals:   03/13/14 0908  BP: 136/91  Pulse: 83  Temp: 98.2 F (36.8 C)  Resp: 16     Physical Exam  Physical Exam  Constitutional: No distress.  Eyes: EOM are normal. Pupils are equal, round, and reactive to light.  Cardiovascular: Normal rate and regular rhythm.   Pulmonary/Chest: Breath sounds normal. No respiratory distress. He has no wheezes. He has no rales.  Abdominal: Soft. There is no tenderness. There is no rebound.  Musculoskeletal: He exhibits no edema.    CBC    Component Value Date/Time   WBC 7.7 12/17/2013 0953   RBC 4.51 12/17/2013 0953   HGB 13.9 12/17/2013 0953   HCT 40.3 12/17/2013 0953   PLT 135* 12/17/2013 0953   MCV 89.4 12/17/2013 0953   LYMPHSABS 1.2 12/17/2013 0953   MONOABS 0.8 12/17/2013 0953   EOSABS 0.2 12/17/2013 0953   BASOSABS 0.1 12/17/2013 0953    CMP     Component Value Date/Time   NA 138 12/17/2013 0953   K 4.5 12/17/2013 0953   CL 103 12/17/2013 0953   CO2 27 12/17/2013 0953   GLUCOSE 76 12/17/2013 0953   BUN 9 12/17/2013 0953   CREATININE 0.93 12/17/2013 0953   CREATININE 0.84 01/03/2012 1140   CALCIUM 8.8 12/17/2013 0953  PROT 7.2 12/17/2013 0953   ALBUMIN 4.1 12/17/2013 0953   AST 63* 12/17/2013 0953   ALT 108* 12/17/2013 0953   ALKPHOS 67 12/17/2013 0953   BILITOT 0.9 12/17/2013 0953   GFRNONAA >89 12/17/2013 0953   GFRNONAA >90 01/03/2012 1140   GFRAA >89 12/17/2013 0953   GFRAA >90 01/03/2012 1140    Lab Results  Component Value Date/Time   CHOL 206* 12/17/2013  9:53 AM    No components found with this basename: hga1c    Lab Results  Component Value Date/Time   AST 63* 12/17/2013  9:53 AM    Assessment and Plan  Unspecified vitamin D deficiency Continue with vitamin D supplement.  Essential hypertension, benign - Plan:  Have started patient onamLODipine (NORVASC) 5 MG tablet, also advised for DASH diet   Abnormal LFTs  advised patient to avoid alcohol and Tylenol. His hepatitis panel is negative.  Obesity, unspecified  diet and exercise. Patient has lost 6 pounds.   Return in about 3 months (around  06/13/2014) for hypertension.  Lorayne Marek, MD

## 2014-03-13 NOTE — Patient Instructions (Signed)
DASH Eating Plan  DASH stands for "Dietary Approaches to Stop Hypertension." The DASH eating plan is a healthy eating plan that has been shown to reduce high blood pressure (hypertension). Additional health benefits may include reducing the risk of type 2 diabetes mellitus, heart disease, and stroke. The DASH eating plan may also help with weight loss.  WHAT DO I NEED TO KNOW ABOUT THE DASH EATING PLAN?  For the DASH eating plan, you will follow these general guidelines:  · Choose foods with a percent daily value for sodium of less than 5% (as listed on the food label).  · Use salt-free seasonings or herbs instead of table salt or sea salt.  · Check with your health care provider or pharmacist before using salt substitutes.  · Eat lower-sodium products, often labeled as "lower sodium" or "no salt added."  · Eat fresh foods.  · Eat more vegetables, fruits, and low-fat dairy products.  · Choose whole grains. Look for the word "whole" as the first word in the ingredient list.  · Choose fish and skinless chicken or turkey more often than red meat. Limit fish, poultry, and meat to 6 oz (170 g) each day.  · Limit sweets, desserts, sugars, and sugary drinks.  · Choose heart-healthy fats.  · Limit cheese to 1 oz (28 g) per day.  · Eat more home-cooked food and less restaurant, buffet, and fast food.  · Limit fried foods.  · Cook foods using methods other than frying.  · Limit canned vegetables. If you do use them, rinse them well to decrease the sodium.  · When eating at a restaurant, ask that your food be prepared with less salt, or no salt if possible.  WHAT FOODS CAN I EAT?  Seek help from a dietitian for individual calorie needs.  Grains  Whole grain or whole wheat bread. Brown rice. Whole grain or whole wheat pasta. Quinoa, bulgur, and whole grain cereals. Low-sodium cereals. Corn or whole wheat flour tortillas. Whole grain cornbread. Whole grain crackers. Low-sodium crackers.  Vegetables  Fresh or frozen vegetables  (raw, steamed, roasted, or grilled). Low-sodium or reduced-sodium tomato and vegetable juices. Low-sodium or reduced-sodium tomato sauce and paste. Low-sodium or reduced-sodium canned vegetables.   Fruits  All fresh, canned (in natural juice), or frozen fruits.  Meat and Other Protein Products  Ground beef (85% or leaner), grass-fed beef, or beef trimmed of fat. Skinless chicken or turkey. Ground chicken or turkey. Pork trimmed of fat. All fish and seafood. Eggs. Dried beans, peas, or lentils. Unsalted nuts and seeds. Unsalted canned beans.  Dairy  Low-fat dairy products, such as skim or 1% milk, 2% or reduced-fat cheeses, low-fat ricotta or cottage cheese, or plain low-fat yogurt. Low-sodium or reduced-sodium cheeses.  Fats and Oils  Tub margarines without trans fats. Light or reduced-fat mayonnaise and salad dressings (reduced sodium). Avocado. Safflower, olive, or canola oils. Natural peanut or almond butter.  Other  Unsalted popcorn and pretzels.  The items listed above may not be a complete list of recommended foods or beverages. Contact your dietitian for more options.  WHAT FOODS ARE NOT RECOMMENDED?  Grains  White bread. White pasta. White rice. Refined cornbread. Bagels and croissants. Crackers that contain trans fat.  Vegetables  Creamed or fried vegetables. Vegetables in a cheese sauce. Regular canned vegetables. Regular canned tomato sauce and paste. Regular tomato and vegetable juices.  Fruits  Dried fruits. Canned fruit in light or heavy syrup. Fruit juice.  Meat and Other Protein   Products  Fatty cuts of meat. Ribs, chicken wings, bacon, sausage, bologna, salami, chitterlings, fatback, hot dogs, bratwurst, and packaged luncheon meats. Salted nuts and seeds. Canned beans with salt.  Dairy  Whole or 2% milk, cream, half-and-half, and cream cheese. Whole-fat or sweetened yogurt. Full-fat cheeses or blue cheese. Nondairy creamers and whipped toppings. Processed cheese, cheese spreads, or cheese  curds.  Condiments  Onion and garlic salt, seasoned salt, table salt, and sea salt. Canned and packaged gravies. Worcestershire sauce. Tartar sauce. Barbecue sauce. Teriyaki sauce. Soy sauce, including reduced sodium. Steak sauce. Fish sauce. Oyster sauce. Cocktail sauce. Horseradish. Ketchup and mustard. Meat flavorings and tenderizers. Bouillon cubes. Hot sauce. Tabasco sauce. Marinades. Taco seasonings. Relishes.  Fats and Oils  Butter, stick margarine, lard, shortening, ghee, and bacon fat. Coconut, palm kernel, or palm oils. Regular salad dressings.  Other  Pickles and olives. Salted popcorn and pretzels.  The items listed above may not be a complete list of foods and beverages to avoid. Contact your dietitian for more information.  WHERE CAN I FIND MORE INFORMATION?  National Heart, [employer], and [employer] Institute: www.nhlbi.nih.gov/health/health-topics/topics/dash/  Document Released: 08/05/2011 Document Revised: 08/21/2013 Document Reviewed: 06/20/2013  ExitCare® Patient Information ©2015 [employer]. This information is not intended to replace advice given to you by your health care provider. Make sure you discuss any questions you have with your health care provider.

## 2014-03-13 NOTE — Progress Notes (Signed)
Patient here for follow up Is concerned his blood pressure has been running high Took two of his mom blood pressure pills about a week ago

## 2014-04-15 ENCOUNTER — Ambulatory Visit: Payer: Self-pay | Admitting: Internal Medicine

## 2014-04-25 ENCOUNTER — Ambulatory Visit: Payer: Self-pay | Attending: Internal Medicine

## 2014-07-17 ENCOUNTER — Ambulatory Visit (HOSPITAL_COMMUNITY)
Admission: RE | Admit: 2014-07-17 | Discharge: 2014-07-17 | Disposition: A | Discharge status: Home / Self Care | Payer: No Typology Code available for payment source | Source: Ambulatory Visit | Attending: Internal Medicine | Admitting: Internal Medicine

## 2014-07-17 ENCOUNTER — Encounter: Payer: Self-pay | Admitting: Internal Medicine

## 2014-07-17 ENCOUNTER — Ambulatory Visit: Payer: No Typology Code available for payment source | Attending: Internal Medicine | Admitting: Internal Medicine

## 2014-07-17 ENCOUNTER — Other Ambulatory Visit: Payer: Self-pay

## 2014-07-17 VITALS — BP 148/98 | HR 72 | Temp 98.8°F | Resp 16 | Wt 226.6 lb

## 2014-07-17 DIAGNOSIS — R200 Anesthesia of skin: Secondary | ICD-10-CM | POA: Insufficient documentation

## 2014-07-17 DIAGNOSIS — M25512 Pain in left shoulder: Secondary | ICD-10-CM | POA: Insufficient documentation

## 2014-07-17 DIAGNOSIS — R1031 Right lower quadrant pain: Secondary | ICD-10-CM | POA: Insufficient documentation

## 2014-07-17 DIAGNOSIS — R110 Nausea: Secondary | ICD-10-CM | POA: Insufficient documentation

## 2014-07-17 DIAGNOSIS — Z1211 Encounter for screening for malignant neoplasm of colon: Secondary | ICD-10-CM

## 2014-07-17 DIAGNOSIS — Z733 Stress, not elsewhere classified: Secondary | ICD-10-CM | POA: Insufficient documentation

## 2014-07-17 DIAGNOSIS — Z658 Other specified problems related to psychosocial circumstances: Secondary | ICD-10-CM

## 2014-07-17 DIAGNOSIS — R1013 Epigastric pain: Secondary | ICD-10-CM

## 2014-07-17 DIAGNOSIS — F439 Reaction to severe stress, unspecified: Secondary | ICD-10-CM

## 2014-07-17 DIAGNOSIS — R0789 Other chest pain: Secondary | ICD-10-CM | POA: Insufficient documentation

## 2014-07-17 DIAGNOSIS — R1084 Generalized abdominal pain: Secondary | ICD-10-CM

## 2014-07-17 LAB — POCT URINALYSIS DIPSTICK
Blood, UA: NEGATIVE
GLUCOSE UA: NEGATIVE
LEUKOCYTES UA: NEGATIVE
NITRITE UA: NEGATIVE
Protein, UA: 30
Spec Grav, UA: 1.015
UROBILINOGEN UA: 0.2
pH, UA: 7

## 2014-07-17 MED ORDER — OMEPRAZOLE 20 MG PO CPDR: 20.0000 mg | DELAYED_RELEASE_CAPSULE | Freq: Every day | ORAL | Status: DC

## 2014-07-17 MED ORDER — NAPROXEN 500 MG PO TABS
500.0000 mg | ORAL_TABLET | Freq: Two times a day (BID) | ORAL | Status: DC
Start: 2014-07-17 — End: 2015-01-13

## 2014-07-17 NOTE — Progress Notes (Signed)
Patient complains of having left sided chest pain that radiates To his left shoulder and down his left arm Patient states this has been going on for about a month Complains of lower abd pain and dark urine Patient also states at times he has been spitting up blood

## 2014-07-17 NOTE — Progress Notes (Signed)
MRN: [id number] Name: Jesse Esparza  Sex: male Age: 48 y.o. DOB: 1965/11/01  Allergies: Review of patient's allergies indicates no known allergies.  Chief Complaint  Patient presents with  . Follow-up    HPI: Patient is 48 y.o. male who history of hypertension comes today for followup , he reported to have lot of bloating symptoms as well as lower abdominal pain ongoing for a few weeks and also noticed dark urine also complains of some nausea but denies any vomiting?he stated some blood when he was nauseous, patient is also complaining of left-sided shoulder/chest pain, denies any fall or trauma as per patient certain movements make the symptoms worse and occasionally feels numbness tingling all the way from the shoulder down to the hand , the pain is worse at night, denies any orthopnea PND or any exertional chest pain or chest shortness of breath, denies smoking cigarettes, EKG today in the office is normal sinus rhythm with occasional PACs, patient has not taken his blood pressure medication today and his blood pressure is 148/98. As per patient he has been under a lot of stress , denies any SI or HI, patient will like to target with the social worker and the counselor does not want to take any medication.  Past Medical History  Diagnosis Date  . Hypercholesteremia     History reviewed. No pertinent past surgical history.    Medication List       This list is accurate as of: 07/17/14 10:48 AM.  Always use your most recent med list.               amLODipine 5 MG tablet  Commonly known as:  NORVASC  Take 1 tablet (5 mg total) by mouth daily.     naproxen 500 MG tablet  Commonly known as:  NAPROSYN  Take 1 tablet (500 mg total) by mouth 2 (two) times daily with a meal.     omeprazole 20 MG capsule  Commonly known as:  PRILOSEC  Take 1 capsule (20 mg total) by mouth daily.     Vitamin D (Ergocalciferol) 50000 UNITS Caps capsule  Commonly known as:  DRISDOL  Take 1  capsule (50,000 Units total) by mouth every 7 (seven) days.        Meds ordered this encounter  Medications  . naproxen (NAPROSYN) 500 MG tablet    Sig: Take 1 tablet (500 mg total) by mouth 2 (two) times daily with a meal.    Dispense:  30 tablet    Refill:  2  . omeprazole (PRILOSEC) 20 MG capsule    Sig: Take 1 capsule (20 mg total) by mouth daily.    Dispense:  30 capsule    Refill:  3     There is no immunization history on file for this patient.  Family History  Problem Relation Age of Onset  . Hyperlipidemia Father   . Hyperlipidemia Mother     History  Substance Use Topics  . Smoking status: Never Smoker   . Smokeless tobacco: Not on file  . Alcohol Use: Yes     Comment: 6 shots of whiskey per day.      Review of Systems   As noted in HPI  Filed Vitals:   07/17/14 1010  BP: 148/98  Pulse: 72  Temp: 98.8 F (37.1 C)  Resp: 16    Physical Exam  Physical Exam  Eyes: EOM are normal. Pupils are equal, round, and reactive to light.  Neck:  Neck supple.  Cardiovascular: Normal rate and regular rhythm.   Pulmonary/Chest: He has no wheezes. He has no rales.  Left chest wall tenderness with palpation   Abdominal: There is no rebound and no guarding.  RLQ tenderness with deep palapation  Musculoskeletal:  Left shoulderTenderness with palpation anteriorly good ROM    CBC    Component Value Date/Time   WBC 7.7 12/17/2013 0953   RBC 4.51 12/17/2013 0953   HGB 13.9 12/17/2013 0953   HCT 40.3 12/17/2013 0953   PLT 135* 12/17/2013 0953   MCV 89.4 12/17/2013 0953   LYMPHSABS 1.2 12/17/2013 0953   MONOABS 0.8 12/17/2013 0953   EOSABS 0.2 12/17/2013 0953   BASOSABS 0.1 12/17/2013 0953    CMP     Component Value Date/Time   NA 138 12/17/2013 0953   K 4.5 12/17/2013 0953   CL 103 12/17/2013 0953   CO2 27 12/17/2013 0953   GLUCOSE 76 12/17/2013 0953   BUN 9 12/17/2013 0953   CREATININE 0.93 12/17/2013 0953   CREATININE 0.84 01/03/2012 1140    CALCIUM 8.8 12/17/2013 0953   PROT 7.2 12/17/2013 0953   ALBUMIN 4.1 12/17/2013 0953   AST 63* 12/17/2013 0953   ALT 108* 12/17/2013 0953   ALKPHOS 67 12/17/2013 0953   BILITOT 0.9 12/17/2013 0953   GFRNONAA >89 12/17/2013 0953   GFRNONAA >90 01/03/2012 1140   GFRAA >89 12/17/2013 0953   GFRAA >90 01/03/2012 1140    Lab Results  Component Value Date/Time   CHOL 206* 12/17/2013 09:53 AM    No components found for: HGA1C  Lab Results  Component Value Date/Time   AST 63* 12/17/2013 09:53 AM    Assessment and Plan  Generalized abdominal pain - Plan:  Results for orders placed or performed in visit on 07/17/14  Urinalysis Dipstick  Result Value Ref Range   Color, UA yellow    Clarity, UA clear    Glucose, UA neg    Bilirubin, UA small    Ketones, UA trace    Spec Grav, UA 1.015    Blood, UA neg    pH, UA 7.0    Protein, UA 30    Urobilinogen, UA 0.2    Nitrite, UA neg    Leukocytes, UA Negative    Urinalysis Dipstick is negative for infection or hematuria.  Other chest pain - Plan: EKG 12-Lead shows NSR with pac  Patient has atypical chest pain with tenderness on palpation , likely musculoskeletal, trial of naproxen (NAPROSYN) 500 MG tablet   Nausea/RLQ abdominal pain - Plan: CT Abdomen Pelvis Wo Contrast  Dyspepsia - Plan: advised for lifestyle modification, trial of omeprazole (PRILOSEC) 20 MG capsule  Stress  Patient wants the resources to talk to counselor, he will speak with social worker   Left shoulder pain - Plan: naproxen (NAPROSYN) 500 MG tablet, DG Shoulder Left  Special screening for malignant neoplasms, colon - Plan: Ambulatory referral to Gastroenterology   Health Maintenance -Colonoscopy: referred to GI Patient declines flu shot   Return in about 3 months (around 10/17/2014) for hypertension.  Lorayne Marek, MD

## 2014-07-19 ENCOUNTER — Ambulatory Visit (HOSPITAL_COMMUNITY)
Admission: RE | Admit: 2014-07-19 | Discharge: 2014-07-19 | Disposition: A | Discharge status: Home / Self Care | Payer: No Typology Code available for payment source | Source: Ambulatory Visit | Attending: Internal Medicine | Admitting: Internal Medicine

## 2014-07-19 ENCOUNTER — Encounter (HOSPITAL_COMMUNITY): Payer: Self-pay

## 2014-07-19 DIAGNOSIS — R1031 Right lower quadrant pain: Secondary | ICD-10-CM | POA: Insufficient documentation

## 2014-07-22 ENCOUNTER — Other Ambulatory Visit: Payer: Self-pay

## 2014-07-31 ENCOUNTER — Encounter: Payer: Self-pay | Admitting: Gastroenterology

## 2014-08-30 HISTORY — PX: COLONOSCOPY: SHX174

## 2014-09-06 ENCOUNTER — Encounter: Payer: Self-pay | Admitting: Internal Medicine

## 2014-09-06 ENCOUNTER — Ambulatory Visit: Payer: No Typology Code available for payment source | Attending: Internal Medicine | Admitting: Internal Medicine

## 2014-09-06 VITALS — BP 129/85 | HR 75 | Temp 98.0°F | Resp 16 | Wt 228.0 lb

## 2014-09-06 DIAGNOSIS — E780 Pure hypercholesterolemia: Secondary | ICD-10-CM | POA: Insufficient documentation

## 2014-09-06 DIAGNOSIS — I10 Essential (primary) hypertension: Secondary | ICD-10-CM

## 2014-09-06 DIAGNOSIS — Z79899 Other long term (current) drug therapy: Secondary | ICD-10-CM | POA: Insufficient documentation

## 2014-09-06 DIAGNOSIS — Z23 Encounter for immunization: Secondary | ICD-10-CM

## 2014-09-06 NOTE — Progress Notes (Signed)
Patient states on Dec 30 started to not feel well Was having some epigastric pain and feeling nausea What ever he ate or drank he felt sick Denies any vomiting or diarrhea Stopped taking his prilosec since than Patient states he did not start really eating until the fourth of January Patient states he is feeling better

## 2014-09-06 NOTE — Progress Notes (Signed)
MRN: [id number] Name: Jesse Esparza  Sex: male Age: 49 y.o. DOB: [birth year]  Allergies: Review of patient's allergies indicates no known allergies.  Chief Complaint  Patient presents with  . Follow-up    HPI: Patient is 49 y.o. male who comes today for followup he has history of hypertension, one to 2 weeks ago he was having abdominal pain nausea vomiting which currently is improved, he had a CAT scan done in November which reported fatty liver and small 2 mm non obstructing renal stone, patient denies any urinary symptoms currently denies any fever chills nausea vomiting,patient wants get hepatitis B vaccination.  Past Medical History  Diagnosis Date  . Hypercholesteremia     History reviewed. No pertinent past surgical history.    Medication List       This list is accurate as of: 09/06/14  5:39 PM.  Always use your most recent med list.               amLODipine 5 MG tablet  Commonly known as:  NORVASC  Take 1 tablet (5 mg total) by mouth daily.     naproxen 500 MG tablet  Commonly known as:  NAPROSYN  Take 1 tablet (500 mg total) by mouth 2 (two) times daily with a meal.     omeprazole 20 MG capsule  Commonly known as:  PRILOSEC  Take 1 capsule (20 mg total) by mouth daily.     Vitamin D (Ergocalciferol) 50000 UNITS Caps capsule  Commonly known as:  DRISDOL  Take 1 capsule (50,000 Units total) by mouth every 7 (seven) days.        No orders of the defined types were placed in this encounter.    Immunization History  Administered Date(s) Administered  . Hepatitis B, adult/adol-2 dose 09/06/2014    Family History  Problem Relation Age of Onset  . Hyperlipidemia Father   . Hyperlipidemia Mother     History  Substance Use Topics  . Smoking status: Never Smoker   . Smokeless tobacco: Not on file  . Alcohol Use: Yes     Comment: 6 shots of whiskey per day.      Review of Systems   As noted in HPI  Filed Vitals:   09/06/14 1605  BP:  129/85  Pulse: 75  Temp: 98 F (36.7 C)  Resp: 16    Physical Exam  Physical Exam  Constitutional: No distress.  Eyes: EOM are normal. Pupils are equal, round, and reactive to light.  Cardiovascular: Normal rate and regular rhythm.   Pulmonary/Chest: Breath sounds normal. No respiratory distress. He has no wheezes. He has no rales.  Abdominal: Soft. There is no tenderness. There is no rebound.  No CVA tenderness     CBC    Component Value Date/Time   WBC 7.7 12/17/2013 0953   RBC 4.51 12/17/2013 0953   HGB 13.9 12/17/2013 0953   HCT 40.3 12/17/2013 0953   PLT 135* 12/17/2013 0953   MCV 89.4 12/17/2013 0953   LYMPHSABS 1.2 12/17/2013 0953   MONOABS 0.8 12/17/2013 0953   EOSABS 0.2 12/17/2013 0953   BASOSABS 0.1 12/17/2013 0953    CMP     Component Value Date/Time   NA 138 12/17/2013 0953   K 4.5 12/17/2013 0953   CL 103 12/17/2013 0953   CO2 27 12/17/2013 0953   GLUCOSE 76 12/17/2013 0953   BUN 9 12/17/2013 0953   CREATININE 0.93 12/17/2013 0953   CREATININE 0.84 01/03/2012 1140  CALCIUM 8.8 12/17/2013 0953   PROT 7.2 12/17/2013 0953   ALBUMIN 4.1 12/17/2013 0953   AST 63* 12/17/2013 0953   ALT 108* 12/17/2013 0953   ALKPHOS 67 12/17/2013 0953   BILITOT 0.9 12/17/2013 0953   GFRNONAA >89 12/17/2013 0953   GFRNONAA >90 01/03/2012 1140   GFRAA >89 12/17/2013 0953   GFRAA >90 01/03/2012 1140    Lab Results  Component Value Date/Time   CHOL 206* 12/17/2013 09:53 AM    No components found for: HGA1C  Lab Results  Component Value Date/Time   AST 63* 12/17/2013 09:53 AM    Assessment and Plan  Essential hypertension The pressure is well controlled, continue with Norvasc 5 mg daily.  Need for hepatitis B vaccination - Plan: Hepatitis B vaccine adult IM   Health Maintenance  -Vaccinations: Patient declines flu shot   Return in about 3 months (around 12/06/2014) for hypertension.  Lorayne Marek, MD

## 2014-09-30 ENCOUNTER — Telehealth: Payer: Self-pay | Admitting: Gastroenterology

## 2014-09-30 ENCOUNTER — Ambulatory Visit (AMBULATORY_SURGERY_CENTER): Payer: Self-pay | Admitting: *Deleted

## 2014-09-30 VITALS — Ht 67.0 in | Wt 230.2 lb

## 2014-09-30 DIAGNOSIS — Z1211 Encounter for screening for malignant neoplasm of colon: Secondary | ICD-10-CM

## 2014-09-30 MED ORDER — NA SULFATE-K SULFATE-MG SULF 17.5-3.13-1.6 GM/177ML PO SOLN: 1.0000 | Freq: Once | ORAL | Status: DC

## 2014-09-30 NOTE — Progress Notes (Signed)
Denies allergies to eggs or soy products. Denies complications with sedation or anesthesia. Denies O2 use. Denies use of diet or weight loss medications.  Emmi instructions given for colonoscopy.  

## 2014-09-30 NOTE — Progress Notes (Signed)
Pharmacy phoned in, suprep not available. Sample prep left at 4th floor for patient to pick up.

## 2014-09-30 NOTE — Telephone Encounter (Signed)
Phone call to patient to inform them that prep is not carried by pharmacy. Sample prep available and will be left at the front desk on the 4th floor for patient to pick up. Patient states understanding.

## 2014-10-07 ENCOUNTER — Ambulatory Visit: Payer: Self-pay | Attending: Internal Medicine | Admitting: *Deleted

## 2014-10-07 DIAGNOSIS — Z23 Encounter for immunization: Secondary | ICD-10-CM | POA: Insufficient documentation

## 2014-10-07 NOTE — Progress Notes (Signed)
Patient presents for second dose of Hep B vaccination States feeling well today Aware third dose is due 03/07/15

## 2014-10-14 ENCOUNTER — Ambulatory Visit (AMBULATORY_SURGERY_CENTER): Payer: Self-pay | Admitting: Gastroenterology

## 2014-10-14 ENCOUNTER — Encounter: Payer: Self-pay | Admitting: Gastroenterology

## 2014-10-14 VITALS — BP 126/81 | HR 72 | Temp 98.2°F | Resp 16 | Ht 67.0 in | Wt 228.0 lb

## 2014-10-14 DIAGNOSIS — K5730 Diverticulosis of large intestine without perforation or abscess without bleeding: Secondary | ICD-10-CM

## 2014-10-14 DIAGNOSIS — D125 Benign neoplasm of sigmoid colon: Secondary | ICD-10-CM

## 2014-10-14 DIAGNOSIS — K635 Polyp of colon: Secondary | ICD-10-CM

## 2014-10-14 DIAGNOSIS — D122 Benign neoplasm of ascending colon: Secondary | ICD-10-CM

## 2014-10-14 DIAGNOSIS — Z1211 Encounter for screening for malignant neoplasm of colon: Secondary | ICD-10-CM

## 2014-10-14 MED ORDER — SODIUM CHLORIDE 0.9 % IV SOLN: 500.0000 mL | INTRAVENOUS | Status: DC

## 2014-10-14 NOTE — Patient Instructions (Signed)
YOU HAD AN ENDOSCOPIC PROCEDURE TODAY AT THE [facility]: Refer to the procedure report that was given to you for any specific questions about what was found during the examination.  If the procedure report does not answer your questions, please call your gastroenterologist to clarify.  If you requested that your care partner not be given the details of your procedure findings, then the procedure report has been included in a sealed envelope for you to review at your convenience later.  YOU SHOULD EXPECT: Some feelings of bloating in the abdomen. Passage of more gas than usual.  Walking can help get rid of the air that was put into your GI tract during the procedure and reduce the bloating. If you had a lower endoscopy (such as a colonoscopy or flexible sigmoidoscopy) you may notice spotting of blood in your stool or on the toilet paper. If you underwent a bowel prep for your procedure, then you may not have a normal bowel movement for a few days.  DIET: Your first meal following the procedure should be a light meal and then it is ok to progress to your normal diet.  A half-sandwich or bowl of soup is an example of a good first meal.  Heavy or fried foods are harder to digest and may make you feel nauseous or bloated.  Likewise meals heavy in dairy and vegetables can cause extra gas to form and this can also increase the bloating.  Drink plenty of fluids but you should avoid alcoholic beverages for 24 hours.  ACTIVITY: Your care partner should take you home directly after the procedure.  You should plan to take it easy, moving slowly for the rest of the day.  You can resume normal activity the day after the procedure however you should NOT DRIVE or use heavy machinery for 24 hours (because of the sedation medicines used during the test).    SYMPTOMS TO REPORT IMMEDIATELY: A gastroenterologist can be reached at any hour.  During normal business hours, 8:30 AM to 5:00 PM Monday through Friday,  call [phone].  After hours and on weekends, please call the GI answering service at [phone] who will take a message and have the physician on call contact you.   Following lower endoscopy (colonoscopy or flexible sigmoidoscopy):  Excessive amounts of blood in the stool  Significant tenderness or worsening of abdominal pains  Swelling of the abdomen that is new, acute  Fever of 100F or higher  FOLLOW UP: If any biopsies were taken you will be contacted by phone or by letter within the next 1-3 weeks.  Call your gastroenterologist if you have not heard about the biopsies in 3 weeks.  Our staff will call the home number listed on your records the next business day following your procedure to check on you and address any questions or concerns that you may have at that time regarding the information given to you following your procedure. This is a courtesy call and so if there is no answer at the home number and we have not heard from you through the emergency physician on call, we will assume that you have returned to your regular daily activities without incident.  SIGNATURES/CONFIDENTIALITY: You and/or your care partner have signed paperwork which will be entered into your electronic medical record.  These signatures attest to the fact that that the information above on your After Visit Summary has been reviewed and is understood.  Full responsibility of the confidentiality of this   this discharge information lies with you and/or your care-partner.  Polyps, diverticulosis, high fiber diet-handouts given  Repeat colonoscopy will be determined by pathology.  

## 2014-10-14 NOTE — Progress Notes (Signed)
A/ox3, pleased with MAC, report to RN 

## 2014-10-14 NOTE — Progress Notes (Signed)
Called to room to assist during endoscopic procedure.  Patient ID and intended procedure confirmed with present staff. Received instructions for my participation in the procedure from the performing physician.  

## 2014-10-14 NOTE — Op Note (Signed)
[place]  [address]. [place], [id number]   COLONOSCOPY PROCEDURE REPORT  PATIENT: Jesse, Esparza  MR#: [id number] BIRTHDATE: [birth year] , 48  yrs. old GENDER: male ENDOSCOPIST: Inda Castle, MD REFERRED BY: PROCEDURE DATE:  10/14/2014 PROCEDURE:   Colonoscopy with snare polypectomy First Screening Colonoscopy - Avg.  risk and is 50 yrs.  old or older Yes.  Prior Negative Screening - Now for repeat screening. N/A  History of Adenoma - Now for follow-up colonoscopy & has been > or = to 3 yrs.  N/A  Polyps Removed Today? Yes. ASA CLASS:   Class II INDICATIONS:average risk patient for colon cancer. MEDICATIONS: Monitored anesthesia care and Propofol 320 mg IV  DESCRIPTION OF PROCEDURE:   After the risks benefits and alternatives of the procedure were thoroughly explained, informed consent was obtained.  The digital rectal exam revealed no abnormalities of the rectum.   The [id number]  endoscope was introduced through the anus and advanced to the cecum, which was identified by both the appendix and ileocecal valve. No adverse events experienced.   The quality of the prep was excellent using Suprep  The instrument was then slowly withdrawn as the colon was fully examined.      COLON FINDINGS: Two sessile polyps ranging from 2 to 39mm in size were found in the ascending colon.  A polypectomy was performed with a cold snare.  The resection was complete, the polyp tissue was completely retrieved and sent to histology.   A sessile polyp measuring 3 mm in size was found in the sigmoid colon.  A polypectomy was performed with a cold snare.  The resection was complete, the polyp tissue was completely retrieved and sent to histology.   There was mild diverticulosis noted in the sigmoid colon.  Retroflexed views revealed no abnormalities. The time to cecum=4 minutes 14 seconds.  Withdrawal time=8 minutes 01 seconds. The scope was withdrawn and the  procedure completed. COMPLICATIONS: There were no immediate complications.  ENDOSCOPIC IMPRESSION: 1.   Two sessile polyps ranging from 2 to 37mm in size were found in the ascending colon; polypectomy was performed with a cold snare 2.   Sessile polyp was found in the sigmoid colon; polypectomy was performed with a cold snare 3.   Mild diverticulosis was noted in the sigmoid colon  RECOMMENDATIONS: If all the polyp(s) removed today are proven to be adenomatous (pre-cancerous) polyps, you will need a colonoscopy in 3 years. Otherwise you should have a repeat colonoscopy in 5 years if any polyps are adenomatous.  You will receive a letter within 1-2 weeks with the results of your biopsy as well as final recommendations. Please call my office if you have not received a letter after 3 weeks.  eSigned:  Inda Castle, MD 10/14/2014 9:12 AM   cc: Lorayne Marek, MD   PATIENT NAME:  Jesse, Esparza MR#: [id number]

## 2014-10-15 ENCOUNTER — Telehealth: Payer: Self-pay | Admitting: *Deleted

## 2014-10-15 NOTE — Telephone Encounter (Signed)
  Follow up Call-  Call back number 10/14/2014  Post procedure Call Back phone  # [phone]  Permission to leave phone message No     Patient questions:  Message left to call us if necessary.

## 2014-10-18 ENCOUNTER — Encounter: Payer: Self-pay | Admitting: Gastroenterology

## 2014-10-28 ENCOUNTER — Ambulatory Visit: Payer: Self-pay

## 2014-11-20 ENCOUNTER — Ambulatory Visit: Payer: Self-pay | Attending: Internal Medicine

## 2014-12-02 ENCOUNTER — Ambulatory Visit: Payer: Self-pay | Admitting: Internal Medicine

## 2014-12-06 ENCOUNTER — Telehealth: Payer: Self-pay | Admitting: *Deleted

## 2014-12-06 ENCOUNTER — Other Ambulatory Visit: Payer: Self-pay | Admitting: Internal Medicine

## 2014-12-06 DIAGNOSIS — E559 Vitamin D deficiency, unspecified: Secondary | ICD-10-CM

## 2014-12-06 MED ORDER — VITAMIN D (ERGOCALCIFEROL) 1.25 MG (50000 UNIT) PO CAPS: 50000.0000 [IU] | ORAL_CAPSULE | ORAL | Status: DC

## 2014-12-06 NOTE — Telephone Encounter (Signed)
Refilled patients vitamin D

## 2014-12-25 ENCOUNTER — Telehealth: Payer: Self-pay | Admitting: Internal Medicine

## 2014-12-25 ENCOUNTER — Ambulatory Visit: Payer: Self-pay | Attending: Internal Medicine | Admitting: Internal Medicine

## 2014-12-25 ENCOUNTER — Ambulatory Visit (HOSPITAL_COMMUNITY)
Admission: RE | Admit: 2014-12-25 | Discharge: 2014-12-25 | Disposition: A | Discharge status: Home / Self Care | Payer: Self-pay | Source: Ambulatory Visit | Attending: Internal Medicine | Admitting: Internal Medicine

## 2014-12-25 ENCOUNTER — Encounter: Payer: Self-pay | Admitting: Internal Medicine

## 2014-12-25 ENCOUNTER — Telehealth: Payer: Self-pay

## 2014-12-25 VITALS — BP 141/97 | HR 88 | Temp 98.0°F | Resp 16 | Wt 229.6 lb

## 2014-12-25 DIAGNOSIS — M79671 Pain in right foot: Secondary | ICD-10-CM | POA: Insufficient documentation

## 2014-12-25 DIAGNOSIS — N390 Urinary tract infection, site not specified: Secondary | ICD-10-CM | POA: Insufficient documentation

## 2014-12-25 DIAGNOSIS — R1031 Right lower quadrant pain: Secondary | ICD-10-CM | POA: Insufficient documentation

## 2014-12-25 LAB — POCT URINALYSIS DIPSTICK
Glucose, UA: NEGATIVE
KETONES UA: NEGATIVE
Nitrite, UA: NEGATIVE
PH UA: 7
PROTEIN UA: 100
RBC UA: NEGATIVE
SPEC GRAV UA: 1.02
Urobilinogen, UA: 0.2

## 2014-12-25 MED ORDER — IBUPROFEN 800 MG PO TABS: 800.0000 mg | ORAL_TABLET | Freq: Three times a day (TID) | ORAL | Status: DC | PRN

## 2014-12-25 MED ORDER — CIPROFLOXACIN HCL 500 MG PO TABS: 500.0000 mg | ORAL_TABLET | Freq: Two times a day (BID) | ORAL | Status: DC

## 2014-12-25 NOTE — Telephone Encounter (Signed)
-----   Message from Lorayne Marek, MD sent at 12/25/2014  1:42 PM EDT ----- Call and let the patient know that has x-ray reported  Tiny bony densities noted along the posterior aspect of the right fifth metatarsal. This may represent old secondary ossification center. Subtle fracture cannot be excluded. No significant abnormality otherwise noted.  Reinforce RICE , if patient has worsening symptoms will refer patient to podiatry for further evaluation.

## 2014-12-25 NOTE — Patient Instructions (Signed)
RICE: Routine Care for Injuries The routine care of many injuries includes Rest, Ice, Compression, and Elevation (RICE). HOME CARE INSTRUCTIONS  Rest is needed to allow your body to heal. Routine activities can usually be resumed when comfortable. Injured tendons and bones can take up to 6 weeks to heal. Tendons are the cord-like structures that attach muscle to bone.  Ice following an injury helps keep the swelling down and reduces pain.  Put ice in a plastic bag.  Place a towel between your skin and the bag.  Leave the ice on for 15-20 minutes, 3-4 times a day, or as directed by your health care provider. Do this while awake, for the first 24 to 48 hours. After that, continue as directed by your caregiver.  Compression helps keep swelling down. It also gives support and helps with discomfort. If an elastic bandage has been applied, it should be removed and reapplied every 3 to 4 hours. It should not be applied tightly, but firmly enough to keep swelling down. Watch fingers or toes for swelling, bluish discoloration, coldness, numbness, or excessive pain. If any of these problems occur, remove the bandage and reapply loosely. Contact your caregiver if these problems continue.  Elevation helps reduce swelling and decreases pain. With extremities, such as the arms, hands, legs, and feet, the injured area should be placed near or above the level of the heart, if possible. SEEK IMMEDIATE MEDICAL CARE IF:  You have persistent pain and swelling.  You develop redness, numbness, or unexpected weakness.  Your symptoms are getting worse rather than improving after several days. These symptoms may indicate that further evaluation or further X-rays are needed. Sometimes, X-rays may not show a small broken bone (fracture) until 1 week or 10 days later. Make a follow-up appointment with your caregiver. Ask when your X-ray results will be ready. Make sure you get your X-ray results. Document Released:  11/28/2000 Document Revised: 08/21/2013 Document Reviewed: 01/15/2011 [employer] Patient Information 2015 [employer]. This information is not intended to replace advice given to you by your health care provider. Make sure you discuss any questions you have with your health care provider.  

## 2014-12-25 NOTE — Progress Notes (Signed)
Patient here for right foot pain Patient states was doing yard work yesterday and twisted his right foot Outside of right foot swollen and black and blue Patient also complains of right side lower quadrant pain and burning

## 2014-12-25 NOTE — Progress Notes (Signed)
MRN: [id number] Name: Jesse Esparza  Sex: male Age: 49 y.o. DOB: 04-29-1966  Allergies: Review of patient's allergies indicates no known allergies.  Chief Complaint  Patient presents with  . Foot Pain    HPI: Patient is 49 y.o. male who comes today complaining of lower pain the pain urinary frequency for the last few days, denies any fever chills nausea vomiting, he also complains of right foot swelling/pain since yesterday as per patient he twisted  his foot he has swelling, patient has not taken any pain medication and has not applied any ice, pain is more worse on the side of the foot.  Past Medical History  Diagnosis Date  . Hypercholesteremia   . Hypertension     Past Surgical History  Procedure Laterality Date  . 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 Left testicular  childhood      Medication List       This list is accurate as of: 12/25/14 11:41 AM.  Always use your most recent med list.               amLODipine 5 MG tablet  Commonly known as:  NORVASC  Take 1 tablet (5 mg total) by mouth daily.     ciprofloxacin 500 MG tablet  Commonly known as:  CIPRO  Take 1 tablet (500 mg total) by mouth 2 (two) times daily.     ibuprofen 800 MG tablet  Commonly known as:  ADVIL,MOTRIN  Take 1 tablet (800 mg total) by mouth every 8 (eight) hours as needed.     naproxen 500 MG tablet  Commonly known as:  NAPROSYN  Take 1 tablet (500 mg total) by mouth 2 (two) times daily with a meal.     omeprazole 20 MG capsule  Commonly known as:  PRILOSEC  Take 1 capsule (20 mg total) by mouth daily.     Vitamin D (Ergocalciferol) 50000 UNITS Caps capsule  Commonly known as:  DRISDOL  Take 1 capsule (50,000 Units total) by mouth every 7 (seven) days.        Meds ordered this encounter  Medications  . ibuprofen (ADVIL,MOTRIN) 800 MG tablet    Sig: Take 1 tablet (800 mg total) by mouth every 8 (eight) hours as needed.    Dispense:  30 tablet    Refill:  1  .  ciprofloxacin (CIPRO) 500 MG tablet    Sig: Take 1 tablet (500 mg total) by mouth 2 (two) times daily.    Dispense:  10 tablet    Refill:  0    Immunization History  Administered Date(s) Administered  . Hepatitis B, adult/adol-2 dose 09/06/2014, 10/07/2014    Family History  Problem Relation Age of Onset  . Hyperlipidemia Father   . Hyperlipidemia Mother   . Colon cancer Neg Hx   . Esophageal cancer Neg Hx   . Rectal cancer Neg Hx   . Stomach cancer Neg Hx     History  Substance Use Topics  . Smoking status: Never Smoker   . Smokeless tobacco: Never Used  . Alcohol Use: 0.0 oz/week    0 Standard drinks or equivalent per week     Comment: pt reports not using any alcohol at this time    Review of Systems   As noted in HPI  Filed Vitals:   12/25/14 0955  BP: 141/97  Pulse: 88  Temp: 98 F (36.7 C)  Resp: 16    Physical Exam  Physical Exam  Cardiovascular: Normal rate and regular rhythm.   Pulmonary/Chest: Breath sounds normal. No respiratory distress. He has no wheezes. He has no rales.  Abdominal: There is no rebound and no guarding.  Suprapubic tenderness, no CVA tenderness  Musculoskeletal:  Right foot swelling and tenderness on the midfoot along the fourth and fifth metatarsal bones, 2+ dorsalis pedis pulse.    CBC    Component Value Date/Time   WBC 7.7 12/17/2013 0953   RBC 4.51 12/17/2013 0953   HGB 13.9 12/17/2013 0953   HCT 40.3 12/17/2013 0953   PLT 135* 12/17/2013 0953   MCV 89.4 12/17/2013 0953   LYMPHSABS 1.2 12/17/2013 0953   MONOABS 0.8 12/17/2013 0953   EOSABS 0.2 12/17/2013 0953   BASOSABS 0.1 12/17/2013 0953    CMP     Component Value Date/Time   NA 138 12/17/2013 0953   K 4.5 12/17/2013 0953   CL 103 12/17/2013 0953   CO2 27 12/17/2013 0953   GLUCOSE 76 12/17/2013 0953   BUN 9 12/17/2013 0953   CREATININE 0.93 12/17/2013 0953   CREATININE 0.84 01/03/2012 1140   CALCIUM 8.8 12/17/2013 0953   PROT 7.2 12/17/2013 0953    ALBUMIN 4.1 12/17/2013 0953   AST 63* 12/17/2013 0953   ALT 108* 12/17/2013 0953   ALKPHOS 67 12/17/2013 0953   BILITOT 0.9 12/17/2013 0953   GFRNONAA >89 12/17/2013 0953   GFRNONAA >90 01/03/2012 1140   GFRAA >89 12/17/2013 0953   GFRAA >90 01/03/2012 1140    Lab Results  Component Value Date/Time   CHOL 206* 12/17/2013 09:53 AM    No results found for: HGBA1C  Lab Results  Component Value Date/Time   AST 63* 12/17/2013 09:53 AM    Assessment and Plan  Right lower quadrant abdominal pain /UTI (lower urinary tract infection- Plan:  Results for orders placed or performed in visit on 12/25/14  Urinalysis Dipstick  Result Value Ref Range   Color, UA yellow    Clarity, UA clear    Glucose, UA neg    Bilirubin, UA small    Ketones, UA neg    Spec Grav, UA 1.020    Blood, UA neg    pH, UA 7.0    Protein, UA 100    Urobilinogen, UA 0.2    Nitrite, UA neg    Leukocytes, UA large (3+)    Urinalysis Dipstick shows leukocytes, prescribed ciprofloxacin (CIPRO) 500 MG tablet, will send urine for culture  Right foot pain - Plan: advised patient for RICE , ordered DG Foot Complete Right, ibuprofen (ADVIL,MOTRIN) 800 MG tablet    Return in about 3 months (around 03/26/2015), or if symptoms worsen or fail to improve.   This note has been created with Surveyor, quantity. Any transcriptional errors are unintentional.    Lorayne Marek, MD

## 2014-12-25 NOTE — Telephone Encounter (Signed)
Spoke with patient about his x ray of his foot He agrees if the foot does not improve we will  Put a referral in epic for podiatry Patient will call me in a couple of days to update  me

## 2014-12-25 NOTE — Telephone Encounter (Signed)
Patient called to let us know he went to get xray done.

## 2014-12-26 ENCOUNTER — Telehealth: Payer: Self-pay

## 2014-12-26 DIAGNOSIS — S99921A Unspecified injury of right foot, initial encounter: Secondary | ICD-10-CM

## 2014-12-26 NOTE — Telephone Encounter (Signed)
Spoke with patient and patient would like referral placed for podiatry

## 2014-12-26 NOTE — Telephone Encounter (Signed)
Patient called to f/u with [place] about podiatry referral, pt provided secondary phone number to be contacted on. F/u with pt

## 2014-12-27 LAB — URINE CULTURE
COLONY COUNT: NO GROWTH
Organism ID, Bacteria: NO GROWTH

## 2014-12-31 ENCOUNTER — Encounter: Payer: Self-pay | Admitting: Podiatry

## 2014-12-31 ENCOUNTER — Ambulatory Visit (INDEPENDENT_AMBULATORY_CARE_PROVIDER_SITE_OTHER): Payer: No Typology Code available for payment source | Admitting: Podiatry

## 2014-12-31 VITALS — BP 125/79 | HR 79 | Resp 15

## 2014-12-31 DIAGNOSIS — M779 Enthesopathy, unspecified: Secondary | ICD-10-CM

## 2014-12-31 DIAGNOSIS — S92901A Unspecified fracture of right foot, initial encounter for closed fracture: Secondary | ICD-10-CM

## 2014-12-31 NOTE — Progress Notes (Signed)
   Subjective:    [name], male    DOB: 1966/01/14, 49 y.o.   MRN: [id number]  HPI Pt presents with right foot pain on lateral side, states that he stepped wrong in his yard and rolled his ankle, noted bruising and swelling. Also note bilateral nail thickening   Review of Systems  All other systems reviewed and are negative.      Objective:   Physical Exam        Assessment & Plan:

## 2015-01-01 NOTE — Progress Notes (Signed)
Subjective:     [name], male   DOB: [birth year], 49 y.o.   MRN: [id number]  HPI patient states I stepped on the outside of my right foot and it started to swell. Several days ago and I had an x-ray which at the time look negative   Review of Systems  All other systems reviewed and are negative.      Objective:   Physical Exam  Constitutional: He is oriented to person, place, and time.  Cardiovascular: Intact distal pulses.   Musculoskeletal: Normal range of motion.  Neurological: He is oriented to person, place, and time.  Skin: Skin is warm.  Nursing note and vitals reviewed.  neurovascular status intact with edema on the lateral side of the right foot nonpitting in nature and diffuse in nature. Pain along the anterior talofibular ligament base of fifth metatarsal and lateral ankle with no area being intensely discomfort in     Assessment:     Sprain in the right lateral foot    Plan:     Reviewed x-rays which were negative for fracture and applied Unna boot Ace wrap and elevation advised today. He will leave this on for days and less it bothers him and will be seen back for me to recheck as needed if symptoms persist

## 2015-01-13 ENCOUNTER — Ambulatory Visit: Payer: Self-pay | Attending: Internal Medicine | Admitting: Internal Medicine

## 2015-01-13 ENCOUNTER — Encounter: Payer: Self-pay | Admitting: Internal Medicine

## 2015-01-13 VITALS — BP 127/88 | HR 83 | Temp 98.0°F | Resp 16 | Wt 234.6 lb

## 2015-01-13 DIAGNOSIS — Z791 Long term (current) use of non-steroidal anti-inflammatories (NSAID): Secondary | ICD-10-CM | POA: Insufficient documentation

## 2015-01-13 DIAGNOSIS — Z792 Long term (current) use of antibiotics: Secondary | ICD-10-CM | POA: Insufficient documentation

## 2015-01-13 DIAGNOSIS — M1990 Unspecified osteoarthritis, unspecified site: Secondary | ICD-10-CM | POA: Insufficient documentation

## 2015-01-13 DIAGNOSIS — M549 Dorsalgia, unspecified: Secondary | ICD-10-CM | POA: Insufficient documentation

## 2015-01-13 DIAGNOSIS — M19019 Primary osteoarthritis, unspecified shoulder: Secondary | ICD-10-CM

## 2015-01-13 DIAGNOSIS — F32A Depression, unspecified: Secondary | ICD-10-CM

## 2015-01-13 DIAGNOSIS — M545 Low back pain: Secondary | ICD-10-CM | POA: Insufficient documentation

## 2015-01-13 DIAGNOSIS — I10 Essential (primary) hypertension: Secondary | ICD-10-CM | POA: Insufficient documentation

## 2015-01-13 DIAGNOSIS — M129 Arthropathy, unspecified: Secondary | ICD-10-CM

## 2015-01-13 DIAGNOSIS — Z79899 Other long term (current) drug therapy: Secondary | ICD-10-CM | POA: Insufficient documentation

## 2015-01-13 DIAGNOSIS — M25512 Pain in left shoulder: Secondary | ICD-10-CM | POA: Insufficient documentation

## 2015-01-13 DIAGNOSIS — G8929 Other chronic pain: Secondary | ICD-10-CM | POA: Insufficient documentation

## 2015-01-13 DIAGNOSIS — F329 Major depressive disorder, single episode, unspecified: Secondary | ICD-10-CM | POA: Insufficient documentation

## 2015-01-13 MED ORDER — NAPROXEN 500 MG PO TABS: 500.0000 mg | ORAL_TABLET | Freq: Two times a day (BID) | ORAL | Status: DC

## 2015-01-13 MED ORDER — DULOXETINE HCL 20 MG PO CPEP: 20.0000 mg | ORAL_CAPSULE | Freq: Every day | ORAL | Status: DC

## 2015-01-13 NOTE — Progress Notes (Signed)
MRN: [id number] Name: Jesse Esparza  Sex: male Age: 49 y.o. DOB: 1965-09-15  Allergies: Review of patient's allergies indicates no known allergies.  Chief Complaint  Patient presents with  . Shoulder Pain    HPI: Patient is 49 y.o. male who has history of hypertension comes today complaining of chronic left shoulder pain, in the past he had an x-ray done which reported a.c. joint arthritis, patient is also complaining of upper back and lower back pain, complaining of depressive symptoms for several months denies any SI or HI, is today requesting some medication which can happen with her depression symptoms.  Past Medical History  Diagnosis Date  . Hypercholesteremia   . Hypertension     Past Surgical History  Procedure Laterality Date  . 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 Left testicular  childhood      Medication List       This list is accurate as of: 01/13/15 11:07 AM.  Always use your most recent med list.               amLODipine 5 MG tablet  Commonly known as:  NORVASC  Take 1 tablet (5 mg total) by mouth daily.     ciprofloxacin 500 MG tablet  Commonly known as:  CIPRO  Take 1 tablet (500 mg total) by mouth 2 (two) times daily.     DULoxetine 20 MG capsule  Commonly known as:  CYMBALTA  Take 1 capsule (20 mg total) by mouth daily.     ibuprofen 800 MG tablet  Commonly known as:  ADVIL,MOTRIN  Take 1 tablet (800 mg total) by mouth every 8 (eight) hours as needed.     naproxen 500 MG tablet  Commonly known as:  NAPROSYN  Take 1 tablet (500 mg total) by mouth 2 (two) times daily with a meal.     omeprazole 20 MG capsule  Commonly known as:  PRILOSEC  Take 1 capsule (20 mg total) by mouth daily.     Vitamin D (Ergocalciferol) 50000 UNITS Caps capsule  Commonly known as:  DRISDOL  Take 1 capsule (50,000 Units total) by mouth every 7 (seven) days.        Meds ordered this encounter  Medications  . DULoxetine (CYMBALTA) 20 MG capsule   Sig: Take 1 capsule (20 mg total) by mouth daily.    Dispense:  30 capsule    Refill:  3  . naproxen (NAPROSYN) 500 MG tablet    Sig: Take 1 tablet (500 mg total) by mouth 2 (two) times daily with a meal.    Dispense:  30 tablet    Refill:  2    Immunization History  Administered Date(s) Administered  . Hepatitis B, adult/adol-2 dose 09/06/2014, 10/07/2014    Family History  Problem Relation Age of Onset  . Hyperlipidemia Father   . Hyperlipidemia Mother   . Colon cancer Neg Hx   . Esophageal cancer Neg Hx   . Rectal cancer Neg Hx   . Stomach cancer Neg Hx     History  Substance Use Topics  . Smoking status: Never Smoker   . Smokeless tobacco: Never Used  . Alcohol Use: 0.0 oz/week    0 Standard drinks or equivalent per week     Comment: pt reports not using any alcohol at this time    Review of Systems   As noted in HPI  Filed Vitals:   01/13/15 0941  BP: 127/88  Pulse: 83  Temp: 98 F (36.7 C)  Resp: 16    Physical Exam  Physical Exam  Constitutional: No distress.  Eyes: EOM are normal. Pupils are equal, round, and reactive to light.  Cardiovascular: Normal rate and regular rhythm.   Pulmonary/Chest: Breath sounds normal. No respiratory distress. He has no wheezes. He has no rales.  Musculoskeletal:  Left shoulder cross arm  test positive. Equal strength both upper extremity  Upper back and lower back paraspinal tenderness    CBC    Component Value Date/Time   WBC 7.7 12/17/2013 0953   RBC 4.51 12/17/2013 0953   HGB 13.9 12/17/2013 0953   HCT 40.3 12/17/2013 0953   PLT 135* 12/17/2013 0953   MCV 89.4 12/17/2013 0953   LYMPHSABS 1.2 12/17/2013 0953   MONOABS 0.8 12/17/2013 0953   EOSABS 0.2 12/17/2013 0953   BASOSABS 0.1 12/17/2013 0953    CMP     Component Value Date/Time   NA 138 12/17/2013 0953   K 4.5 12/17/2013 0953   CL 103 12/17/2013 0953   CO2 27 12/17/2013 0953   GLUCOSE 76 12/17/2013 0953   BUN 9 12/17/2013 0953    CREATININE 0.93 12/17/2013 0953   CREATININE 0.84 01/03/2012 1140   CALCIUM 8.8 12/17/2013 0953   PROT 7.2 12/17/2013 0953   ALBUMIN 4.1 12/17/2013 0953   AST 63* 12/17/2013 0953   ALT 108* 12/17/2013 0953   ALKPHOS 67 12/17/2013 0953   BILITOT 0.9 12/17/2013 0953   GFRNONAA >89 12/17/2013 0953   GFRNONAA >90 01/03/2012 1140   GFRAA >89 12/17/2013 0953   GFRAA >90 01/03/2012 1140    Lab Results  Component Value Date/Time   CHOL 206* 12/17/2013 09:53 AM    No results found for: HGBA1C  Lab Results  Component Value Date/Time   AST 63* 12/17/2013 09:53 AM    Assessment and Plan  Acromioclavicular joint arthritis /Left shoulder pain - Plan: naproxen (NAPROSYN) 500 MG tablet  Depression - Plan: Trial of DULoxetine (CYMBALTA) 20 MG capsule which will also help him with a musculoskeletal pain. Advised patient to get immediate medical attention if she has any symptoms of SI or HI.   Essential hypertension Blood pressure is well-controlled continued current meds.    Return in about 3 months (around 04/15/2015) for hypertension.   This note has been created with Surveyor, quantity. Any transcriptional errors are unintentional.    Lorayne Marek, MD

## 2015-01-13 NOTE — Progress Notes (Signed)
Patient complains of bilateral shoulder pain Patient states the naproxen and ibuprofen are not helping Patient requesting something stronger for the pain Patient also states he suffers from depression and would like something prescribed for that As well

## 2015-01-17 MED ORDER — VITAMIN D (ERGOCALCIFEROL) 1.25 MG (50000 UNIT) PO CAPS
50000.0000 [IU] | ORAL_CAPSULE | ORAL | Status: DC
Start: 2015-01-17 — End: 2015-07-09

## 2015-01-21 ENCOUNTER — Emergency Department (HOSPITAL_COMMUNITY): Payer: Self-pay

## 2015-01-21 ENCOUNTER — Encounter (HOSPITAL_COMMUNITY): Payer: Self-pay | Admitting: Emergency Medicine

## 2015-01-21 ENCOUNTER — Emergency Department (HOSPITAL_COMMUNITY)
Admission: EM | Admit: 2015-01-21 | Discharge: 2015-01-21 | Disposition: A | Discharge status: Home / Self Care | Payer: Self-pay | Attending: Emergency Medicine | Admitting: Emergency Medicine

## 2015-01-21 DIAGNOSIS — Z791 Long term (current) use of non-steroidal anti-inflammatories (NSAID): Secondary | ICD-10-CM | POA: Insufficient documentation

## 2015-01-21 DIAGNOSIS — R109 Unspecified abdominal pain: Secondary | ICD-10-CM

## 2015-01-21 DIAGNOSIS — N201 Calculus of ureter: Secondary | ICD-10-CM | POA: Insufficient documentation

## 2015-01-21 DIAGNOSIS — I10 Essential (primary) hypertension: Secondary | ICD-10-CM | POA: Insufficient documentation

## 2015-01-21 DIAGNOSIS — Z79899 Other long term (current) drug therapy: Secondary | ICD-10-CM | POA: Insufficient documentation

## 2015-01-21 DIAGNOSIS — Z8639 Personal history of other endocrine, nutritional and metabolic disease: Secondary | ICD-10-CM | POA: Insufficient documentation

## 2015-01-21 DIAGNOSIS — M549 Dorsalgia, unspecified: Secondary | ICD-10-CM

## 2015-01-21 LAB — URINALYSIS, ROUTINE W REFLEX MICROSCOPIC
BILIRUBIN URINE: NEGATIVE
Glucose, UA: NEGATIVE mg/dL
HGB URINE DIPSTICK: NEGATIVE
KETONES UR: NEGATIVE mg/dL
Leukocytes, UA: NEGATIVE
NITRITE: NEGATIVE
PROTEIN: NEGATIVE mg/dL
SPECIFIC GRAVITY, URINE: 1.012 (ref 1.005–1.030)
Urobilinogen, UA: 0.2 mg/dL (ref 0.0–1.0)
pH: 5.5 (ref 5.0–8.0)

## 2015-01-21 LAB — CBC WITH DIFFERENTIAL/PLATELET
BASOS ABS: 0 10*3/uL (ref 0.0–0.1)
Basophils Relative: 0 % (ref 0–1)
EOS PCT: 0 % (ref 0–5)
Eosinophils Absolute: 0 10*3/uL (ref 0.0–0.7)
HEMATOCRIT: 39.6 % (ref 39.0–52.0)
Hemoglobin: 13.1 g/dL (ref 13.0–17.0)
LYMPHS ABS: 0.9 10*3/uL (ref 0.7–4.0)
Lymphocytes Relative: 7 % — ABNORMAL LOW (ref 12–46)
MCH: 29.7 pg (ref 26.0–34.0)
MCHC: 33.1 g/dL (ref 30.0–36.0)
MCV: 89.8 fL (ref 78.0–100.0)
MONO ABS: 0.6 10*3/uL (ref 0.1–1.0)
Monocytes Relative: 5 % (ref 3–12)
Neutro Abs: 10.8 10*3/uL — ABNORMAL HIGH (ref 1.7–7.7)
Neutrophils Relative %: 88 % — ABNORMAL HIGH (ref 43–77)
Platelets: 161 10*3/uL (ref 150–400)
RBC: 4.41 MIL/uL (ref 4.22–5.81)
RDW: 13.7 % (ref 11.5–15.5)
WBC: 12.3 10*3/uL — ABNORMAL HIGH (ref 4.0–10.5)

## 2015-01-21 LAB — COMPREHENSIVE METABOLIC PANEL
ALK PHOS: 67 U/L (ref 38–126)
ALT: 24 U/L (ref 17–63)
AST: 30 U/L (ref 15–41)
Albumin: 4.2 g/dL (ref 3.5–5.0)
Anion gap: 12 (ref 5–15)
BUN: 16 mg/dL (ref 6–20)
CO2: 24 mmol/L (ref 22–32)
Calcium: 9.2 mg/dL (ref 8.9–10.3)
Chloride: 102 mmol/L (ref 101–111)
Creatinine, Ser: 1.32 mg/dL — ABNORMAL HIGH (ref 0.61–1.24)
GFR calc Af Amer: >60 mL/min (ref >60)
Glucose, Bld: 90 mg/dL (ref 65–99)
Potassium: 4.3 mmol/L (ref 3.5–5.1)
Sodium: 138 mmol/L (ref 135–145)
Total Bilirubin: 1.1 mg/dL (ref 0.3–1.2)
Total Protein: 7.8 g/dL (ref 6.5–8.1)

## 2015-01-21 MED ORDER — HYDROCODONE-ACETAMINOPHEN 5-325 MG PO TABS: 1.0000 | ORAL_TABLET | Freq: Four times a day (QID) | ORAL | Status: DC | PRN

## 2015-01-21 MED ORDER — SODIUM CHLORIDE 0.9 % IV BOLUS (SEPSIS): 500.0000 mL | Freq: Once | INTRAVENOUS | Status: DC

## 2015-01-21 MED ORDER — SODIUM CHLORIDE 0.9 % IV BOLUS (SEPSIS)
500.0000 mL | Freq: Once | INTRAVENOUS | Status: AC
  Administered 2015-01-21: 500 mL via INTRAVENOUS

## 2015-01-21 MED ORDER — IBUPROFEN 800 MG PO TABS
800.0000 mg | ORAL_TABLET | Freq: Once | ORAL | Status: AC
  Administered 2015-01-21: 800 mg via ORAL
  Filled 2015-01-21: qty 1

## 2015-01-21 MED ORDER — ONDANSETRON HCL 4 MG PO TABS: 4.0000 mg | ORAL_TABLET | Freq: Two times a day (BID) | ORAL | Status: DC | PRN

## 2015-01-21 MED ORDER — MORPHINE SULFATE 2 MG/ML IJ SOLN: 2.0000 mg | Freq: Once | INTRAMUSCULAR | Status: DC

## 2015-01-21 MED ORDER — ONDANSETRON HCL 4 MG/2ML IJ SOLN
4.0000 mg | Freq: Once | INTRAMUSCULAR | Status: AC
  Administered 2015-01-21: 4 mg via INTRAVENOUS
  Filled 2015-01-21: qty 2

## 2015-01-21 NOTE — ED Notes (Signed)
Encouraged patient to void, no urge at this time. Provided water to help.

## 2015-01-21 NOTE — ED Notes (Signed)
Discussed plan of care with Dr. Randal Buba, patient may have motrin 800mg  for pain management.

## 2015-01-21 NOTE — ED Provider Notes (Signed)
CSN: [id number]     Arrival date & time 01/21/15  6269 History   First MD Initiated Contact with Patient 01/21/15 [phone]     Chief Complaint  Patient presents with  . Back Pain     (Consider location/radiation/quality/duration/timing/severity/associated sxs/prior Treatment) The history is provided by the patient. No language interpreter was used.  Jesse Esparza is a 49 y/o M with PMHx of HTN, HCL presenting to the ED with right flank pain and back pain that has been ongoing for the past couple of days with worsening this morning at approximately 2:00AM. Patient reported that the pain is a sharp pain with radiation to the right flank to the right lower quadrant. Patient reported that the pain is constant. Stated that he has been using naproxen with minimal relief. Patient reported that he has been getting the urge to go to the bathroom, but stated that when he goes to the bathroom he does not urinate. Patient reported that he has not had kidney issues in the past. Reported associated nausea. 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 Denied chest pain, shortness of breath, difficulty breathing, vomiting, diarrhea, melanotic hematochezia, hematuria, dysuria, decreased urine, fever, chills, numbness, tingling, loss of sensation, urinary and bowel incontinence, back injury. PCP Dr. Annitta Needs  Past Medical History  Diagnosis Date  . Hypercholesteremia   . Hypertension    Past Surgical History  Procedure Laterality Date  . 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 Left testicular  childhood   Family History  Problem Relation Age of Onset  . Hyperlipidemia Father   . Hyperlipidemia Mother   . Colon cancer Neg Hx   . Esophageal cancer Neg Hx   . Rectal cancer Neg Hx   . Stomach cancer Neg Hx    History  Substance Use Topics  . Smoking status: Never Smoker   . Smokeless tobacco: Never Used  . Alcohol Use: No     Comment: pt reports not using any  alcohol at this time    Review of Systems  Constitutional: Negative for fever and chills.  Respiratory: Negative for chest tightness and shortness of breath.   Cardiovascular: Negative for chest pain.  Gastrointestinal: Positive for nausea. Negative for vomiting, abdominal pain, diarrhea, constipation, blood in stool and anal bleeding.  Genitourinary: Positive for flank pain (right ). Negative for dysuria, hematuria and decreased urine volume.  Musculoskeletal: Positive for back pain. Negative for neck pain and neck stiffness.  Neurological: Negative for weakness and numbness.      Allergies  Review of patient's allergies indicates no known allergies.  Home Medications   Prior to Admission medications   Medication Sig Start Date End Date Taking? Authorizing Provider  amLODipine (NORVASC) 5 MG tablet Take 1 tablet (5 mg total) by mouth daily. 03/13/14  Yes Lorayne Marek, MD  DULoxetine (CYMBALTA) 20 MG capsule Take 1 capsule (20 mg total) by mouth daily. 01/13/15  Yes Lorayne Marek, MD  ibuprofen (ADVIL,MOTRIN) 800 MG tablet Take 1 tablet (800 mg total) by mouth every 8 (eight) hours as needed. 12/25/14  Yes Lorayne Marek, MD  naproxen (NAPROSYN) 500 MG tablet Take 1 tablet (500 mg total) by mouth 2 (two) times daily with a meal. 01/13/15  Yes Deepak Advani, MD  omeprazole (PRILOSEC) 20 MG capsule Take 1 capsule (20 mg total) by mouth daily. 07/17/14  Yes Lorayne Marek, MD  Vitamin D, Ergocalciferol, (DRISDOL) 50000 UNITS CAPS capsule Take 1 capsule (50,000  Units total) by mouth every 7 (seven) days. 01/17/15  Yes Tresa Garter, MD  ciprofloxacin (CIPRO) 500 MG tablet Take 1 tablet (500 mg total) by mouth 2 (two) times daily. Patient not taking: Reported on 01/21/2015 12/25/14   Lorayne Marek, MD  Vitamin D, Ergocalciferol, (DRISDOL) 50000 UNITS CAPS capsule Take 1 capsule (50,000 Units total) by mouth every 7 (seven) days. Patient not taking: Reported on 01/21/2015 12/06/14   Tresa Garter, MD   BP 108/64 mmHg  Pulse 105  Temp(Src) 98 F (36.7 C) (Oral)  Resp 18  Ht 5\' 9"  (1.753 m)  Wt 250 lb (113.399 kg)  BMI 36.90 kg/m2  SpO2 99% Physical Exam  Constitutional: He is oriented to person, place, and time. He appears well-developed and well-nourished. No distress.  Patient resting comfortably in bed upright  HENT:  Head: Normocephalic and atraumatic.  Mouth/Throat: Oropharynx is clear and moist. No oropharyngeal exudate.  Eyes: Conjunctivae and EOM are normal. Pupils are equal, round, and reactive to light. Right eye exhibits no discharge. Left eye exhibits no discharge.  Neck: Normal range of motion. Neck supple. No tracheal deviation present.  Cardiovascular: Normal rate, regular rhythm and normal heart sounds.  Exam reveals no friction rub.   No murmur heard. Pulmonary/Chest: Effort normal and breath sounds normal. No respiratory distress. He has no wheezes. He has no rales. He exhibits no tenderness.  Abdominal: Soft. Bowel sounds are normal. He exhibits no distension. There is no tenderness. There is CVA tenderness (Right). There is no rebound and no guarding.  Musculoskeletal: Normal range of motion. He exhibits tenderness.       Lumbar back: He exhibits tenderness. He exhibits normal range of motion, no bony tenderness, no swelling, no edema and no deformity.       Back:  Negative deformities identified to the spine Tenderness upon palpation to the right paravertebral region of the lumbar spine with negative findings of ecchymosis, trauma, bulging, deformities.  Lymphadenopathy:    He has no cervical adenopathy.  Neurological: He is alert and oriented to person, place, and time. No cranial nerve deficit. He exhibits normal muscle tone. Coordination normal.  Cranial nerves III-XII grossly intact Strength 5+/5+ to upper and lower extremities bilaterally with resistance applied, equal distribution noted Equal grip strength Negative saddle paresthesias  bilaterally  Negative arm drift Gait proper, proper balance - negative sway, negative drift, negative step-offs  Skin: Skin is warm and dry. No rash noted. He is not diaphoretic. No erythema.  Psychiatric: He has a normal mood and affect. His behavior is normal. Thought content normal.  Nursing note and vitals reviewed.   ED Course  Procedures (including critical care time)  Results for orders placed or performed during the hospital encounter of 01/21/15  Urinalysis, Routine w reflex microscopic  Result Value Ref Range   Color, Urine YELLOW YELLOW   APPearance CLEAR CLEAR   Specific Gravity, Urine 1.012 1.005 - 1.030   pH 5.5 5.0 - 8.0   Glucose, UA NEGATIVE NEGATIVE mg/dL   Hgb urine dipstick NEGATIVE NEGATIVE   Bilirubin Urine NEGATIVE NEGATIVE   Ketones, ur NEGATIVE NEGATIVE mg/dL   Protein, ur NEGATIVE NEGATIVE mg/dL   Urobilinogen, UA 0.2 0.0 - 1.0 mg/dL   Nitrite NEGATIVE NEGATIVE   Leukocytes, UA NEGATIVE NEGATIVE  CBC with Differential/Platelet  Result Value Ref Range   WBC 12.3 (H) 4.0 - 10.5 K/uL   RBC 4.41 4.22 - 5.81 MIL/uL   Hemoglobin 13.1 13.0 - 17.0 g/dL  HCT 39.6 39.0 - 52.0 %   MCV 89.8 78.0 - 100.0 fL   MCH 29.7 26.0 - 34.0 pg   MCHC 33.1 30.0 - 36.0 g/dL   RDW 13.7 11.5 - 15.5 %   Platelets 161 150 - 400 K/uL   Neutrophils Relative % 88 (H) 43 - 77 %   Neutro Abs 10.8 (H) 1.7 - 7.7 K/uL   Lymphocytes Relative 7 (L) 12 - 46 %   Lymphs Abs 0.9 0.7 - 4.0 K/uL   Monocytes Relative 5 3 - 12 %   Monocytes Absolute 0.6 0.1 - 1.0 K/uL   Eosinophils Relative 0 0 - 5 %   Eosinophils Absolute 0.0 0.0 - 0.7 K/uL   Basophils Relative 0 0 - 1 %   Basophils Absolute 0.0 0.0 - 0.1 K/uL  Comprehensive metabolic panel  Result Value Ref Range   Sodium 138 135 - 145 mmol/L   Potassium 4.3 3.5 - 5.1 mmol/L   Chloride 102 101 - 111 mmol/L   CO2 24 22 - 32 mmol/L   Glucose, Bld 90 65 - 99 mg/dL   BUN 16 6 - 20 mg/dL   Creatinine, Ser 1.32 (H) 0.61 - 1.24 mg/dL    Calcium 9.2 8.9 - 10.3 mg/dL   Total Protein 7.8 6.5 - 8.1 g/dL   Albumin 4.2 3.5 - 5.0 g/dL   AST 30 15 - 41 U/L   ALT 24 17 - 63 U/L   Alkaline Phosphatase 67 38 - 126 U/L   Total Bilirubin 1.1 0.3 - 1.2 mg/dL   GFR calc non Af Amer >60 >60 mL/min   GFR calc Af Amer >60 >60 mL/min   Anion gap 12 5 - 15    Labs Review Labs Reviewed  CBC WITH DIFFERENTIAL/PLATELET - Abnormal; Notable for the following:    WBC 12.3 (*)    Neutrophils Relative % 88 (*)    Neutro Abs 10.8 (*)    Lymphocytes Relative 7 (*)    All other components within normal limits  URINALYSIS, ROUTINE W REFLEX MICROSCOPIC  COMPREHENSIVE METABOLIC PANEL    Imaging Review Dg Lumbar Spine Complete  01/21/2015   CLINICAL DATA:  Right flank pain for 2 weeks without known injury.  EXAM: LUMBAR SPINE - COMPLETE 4+ VIEW  COMPARISON:  None.  FINDINGS: No fracture or spondylolisthesis is noted. Mild degenerative disc disease is noted at L3-4 with anterior osteophyte formation. Anterior osteophyte formation is also noted at L2-3 and L4-5. Posterior facet joints appear grossly normal.  IMPRESSION: Mild degenerative disc disease is noted at L3-4. No acute abnormality seen in the lumbar spine.   Electronically Signed   By: Marijo Conception, M.D.   On: 01/21/2015 08:57     EKG Interpretation None       10:40 AM Spoke with attending physician, Dr. Allie Bossier. Labs, imaging, vitals reviewed in great detail. Recommended 500 mL to be administered IV of fluids and that patient can be discharged home with pain medications and Urology follow up.   11:06 AM Spoke with patient stated that he feels well. Discussed labs and imaging in great detail. Discussed plan for discharge with patient, follow up with Urology, and pain medications. Patient agrees and feels comfortable with plan.   MDM   Final diagnoses:  Back pain  Right flank pain  Ureteral calculus    Medications  sodium chloride 0.9 % bolus 500 mL (0 mLs Intravenous Stopped  01/21/15 1125)  ibuprofen (ADVIL,MOTRIN) tablet 800 mg (  800 mg Oral Given 01/21/15 0543)  sodium chloride 0.9 % bolus 500 mL (0 mLs Intravenous Stopped 01/21/15 1030)  ondansetron (ZOFRAN) injection 4 mg (4 mg Intravenous Given 01/21/15 0929)    Filed Vitals:   01/21/15 1036 01/21/15 1045 01/21/15 1100 01/21/15 1115  BP:  111/62 110/66 118/67  Pulse:  86 86 83  Temp: 97.9 F (36.6 C)     TempSrc: Rectal     Resp:    16  Height:      Weight:      SpO2:  99% 100% 100%    CBC noted mildly elevated white blood cell count of 12.3. Hemoglobin 13.1, hematocrit 39.6. CMP noted mildly elevated creatinine of 1.32, previous creatinine was 0.73 when compared to one year ago. Urinalysis negative for hemoglobin, nitrites, leukocytes-negative findings of infection. Plain film of lumbar spine noted mild degenerative disc disease is noted at L3-4, no acute abnormalities seen in the lumbar spine. CT abdomen pelvis without contrast noted mild right hydroureteronephrosis with perinephric straining during secondary to a 5 mm distal right ureteral calculus. Incidental 4 mm nodule in the left lung base stable from prior exam. Patient presenting to the ED with right flank pain that has been ongoing for the past couple of days. Doubt cauda equina. Doubt epidural abscess. Nephrolithiasis identified on CT abdomen and pelvis with contrast - hydronephrosis noted, but stone is 5 mm and will pass. Negative findings of UTI, pyelonephritis - negative findings of infected urine. Labs unremarkable. Vitals have been stable in the ED. Labs, imaging, and vitals reviewed with attending physician, Dr. Allie Bossier - agreed to plan of discharge. Patient stable, afebrile. Patient not septic appearing. Negative signs of respiratory distress. Negative episodes of emesis while in the ED setting, patient tolerated fluids PO without difficulty. Discharged patient. Discharged patient with pain medications - discussed course, precautions, disposal  technique. Discussed with patient findings of incidental lung nodule, reported to follow up with PCP regarding this with a repeat CT of the chest. Referred patient to PCP and Urology. Discussed with patient to rest and stay hydrated. Urine strainer given upon discharge. Discussed with patient to closely monitor symptoms and if symptoms are to worsen or change to report back to the ED - strict return instructions given.  Patient agreed to plan of care, understood, all questions answered.   Jamse Mead, PA-C 01/21/15 [place] Yao, MD 01/25/15 [phone]

## 2015-01-21 NOTE — ED Notes (Signed)
Pt transported to Xray. 

## 2015-01-21 NOTE — ED Notes (Signed)
Patient reports back pain on right side, took muscle relaxant around 8pm that did not relieve pain. Reports no kidney issues.

## 2015-01-21 NOTE — Discharge Instructions (Signed)
Please call your doctor for a followup appointment within 24-48 hours. When you talk to your doctor please let them know that you were seen in the emergency department and have them acquire all of your records so that they can discuss the findings with you and formulate a treatment plan to fully care for your new and ongoing problems. Please have creatinine re-checked within 24-48 hours Please follow-up with your primary care provider Please follow-up with urology Please rest and stay hydrated Please strain urine with each urination you have  Please take medications as prescribed - while on pain medications there is to be no drinking alcohol, driving, operating any heavy machinery. If extra please dispose in a proper manner. Please do not take any extra Tylenol with this medication for this can lead to Tylenol overdose and liver issues.  While taking Zofran please no more than 8 mg per day Lung nodule, growth, noted on the CT in the left lung - recommended repeat CT of the chest Please continue to monitor symptoms closely and if symptoms are to worsen or change (fever greater than 101, chills, sweating, nausea, vomiting, chest pain, shortness of breathe, difficulty breathing, weakness, numbness, tingling, worsening or changes to pain pattern, blood in the urine, decreased urination, pain with urination, worsening or changes to flank and abdominal pain, inability to food fluids down) please report back to the Emergency Department immediately.   Kidney Stones Kidney stones (urolithiasis) are deposits that form inside your kidneys. The intense pain is caused by the stone moving through the urinary tract. When the stone moves, the ureter goes into spasm around the stone. The stone is usually passed in the urine.  CAUSES   A disorder that makes certain neck glands produce too much parathyroid hormone (primary hyperparathyroidism).  A buildup of uric acid crystals, similar to gout in your  joints.  Narrowing (stricture) of the ureter.  A kidney obstruction present at birth (congenital obstruction).  Previous surgery on the kidney or ureters.  Numerous kidney infections. SYMPTOMS   Feeling sick to your stomach (nauseous).  Throwing up (vomiting).  Blood in the urine (hematuria).  Pain that usually spreads (radiates) to the groin.  Frequency or urgency of urination. DIAGNOSIS   Taking a history and physical exam.  Blood or urine tests.  CT scan.  Occasionally, an examination of the inside of the urinary bladder (cystoscopy) is performed. TREATMENT   Observation.  Increasing your fluid intake.  Extracorporeal shock wave lithotripsy--This is a noninvasive procedure that uses shock waves to break up kidney stones.  Surgery may be needed if you have severe pain or persistent obstruction. There are various surgical procedures. Most of the procedures are performed with the use of small instruments. Only small incisions are needed to accommodate these instruments, so recovery time is minimized. The size, location, and chemical composition are all important variables that will determine the proper choice of action for you. Talk to your health care provider to better understand your situation so that you will minimize the risk of injury to yourself and your kidney.  HOME CARE INSTRUCTIONS   Drink enough water and fluids to keep your urine clear or pale yellow. This will help you to pass the stone or stone fragments.  Strain all urine through the provided strainer. Keep all particulate matter and stones for your health care provider to see. The stone causing the pain may be as small as a grain of salt. It is very important to use the strainer  each and every time you pass your urine. The collection of your stone will allow your health care provider to analyze it and verify that a stone has actually passed. The stone analysis will often identify what you can do to reduce  the incidence of recurrences.  Only take over-the-counter or prescription medicines for pain, discomfort, or fever as directed by your health care provider.  Make a follow-up appointment with your health care provider as directed.  Get follow-up X-rays if required. The absence of pain does not always mean that the stone has passed. It may have only stopped moving. If the urine remains completely obstructed, it can cause loss of kidney function or even complete destruction of the kidney. It is your responsibility to make sure X-rays and follow-ups are completed. Ultrasounds of the kidney can show blockages and the status of the kidney. Ultrasounds are not associated with any radiation and can be performed easily in a matter of minutes. SEEK MEDICAL CARE IF:  You experience pain that is progressive and unresponsive to any pain medicine you have been prescribed. SEEK IMMEDIATE MEDICAL CARE IF:   Pain cannot be controlled with the prescribed medicine.  You have a fever or shaking chills.  The severity or intensity of pain increases over 18 hours and is not relieved by pain medicine.  You develop a new onset of abdominal pain.  You feel faint or pass out.  You are unable to urinate. MAKE SURE YOU:   Understand these instructions.  Will watch your condition.  Will get help right away if you are not doing well or get worse. Document Released: 08/16/2005 Document Revised: 04/18/2013 Document Reviewed: 01/17/2013 [facility] Patient Information 2015 [place], [place]. This information is not intended to replace advice given to you by your health care provider. Make sure you discuss any questions you have with your health care provider.

## 2015-01-28 ENCOUNTER — Ambulatory Visit: Payer: Self-pay | Attending: Internal Medicine | Admitting: Internal Medicine

## 2015-01-28 ENCOUNTER — Encounter: Payer: Self-pay | Admitting: Internal Medicine

## 2015-01-28 VITALS — BP 129/89 | HR 98 | Temp 98.8°F | Resp 15 | Wt 229.0 lb

## 2015-01-28 DIAGNOSIS — R911 Solitary pulmonary nodule: Secondary | ICD-10-CM | POA: Insufficient documentation

## 2015-01-28 DIAGNOSIS — N201 Calculus of ureter: Secondary | ICD-10-CM | POA: Insufficient documentation

## 2015-01-28 DIAGNOSIS — N289 Disorder of kidney and ureter, unspecified: Secondary | ICD-10-CM | POA: Insufficient documentation

## 2015-01-28 DIAGNOSIS — I10 Essential (primary) hypertension: Secondary | ICD-10-CM | POA: Insufficient documentation

## 2015-01-28 LAB — COMPLETE METABOLIC PANEL WITH GFR
ALT: 18 U/L (ref 0–53)
AST: 18 U/L (ref 0–37)
Albumin: 4.5 g/dL (ref 3.5–5.2)
Alkaline Phosphatase: 76 U/L (ref 39–117)
BILIRUBIN TOTAL: 0.8 mg/dL (ref 0.2–1.2)
BUN: 11 mg/dL (ref 6–23)
CO2: 27 mEq/L (ref 19–32)
CREATININE: 0.93 mg/dL (ref 0.50–1.35)
Calcium: 9.2 mg/dL (ref 8.4–10.5)
Chloride: 98 mEq/L (ref 96–112)
GFR, Est Non African American: >89 mL/min
Glucose, Bld: 135 mg/dL — ABNORMAL HIGH (ref 70–99)
Potassium: 4.1 mEq/L (ref 3.5–5.3)
Sodium: 137 mEq/L (ref 135–145)
Total Protein: 7.7 g/dL (ref 6.0–8.3)

## 2015-01-28 NOTE — Progress Notes (Signed)
Patient here for follow up from the ED Patient was diagnosed with kidney stones and has since passed a stone Patient will need a referral to urology Patient also had a lung nodule on a recent scan and will need to have a repeat CT scan

## 2015-01-28 NOTE — Progress Notes (Signed)
MRN: [id number] Name: Jesse Esparza  Sex: male Age: 49 y.o. DOB: [birth year]  Allergies: Review of patient's allergies indicates no known allergies.  Chief Complaint  Patient presents with  . Follow-up    HPI: Patient is 49 y.o. male who has history of hypertension, recently went to the emergency room with symptoms of right flank pain, EMR reviewed patient had a CT abdomen done which reported to 5 MM distal right ureteral calculus with mild right hydroureteronephrosis with perinephric stranding  urine was negative for infection or cast, patient was given pain medication and as per patient he has passed the stone and denies any more nausea vomiting or any urinary symptoms, there was also incidental finding of 4 MM nodule in the left lung base which was stable compared to prior exam, patient denies smoking cigarettes, repeat CT scan is recommended in 6 months.  Past Medical History  Diagnosis Date  . Hypercholesteremia   . Hypertension     Past Surgical History  Procedure Laterality Date  . 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 Left testicular  childhood      Medication List       This list is accurate as of: 01/28/15 10:25 AM.  Always use your most recent med list.               amLODipine 5 MG tablet  Commonly known as:  NORVASC  Take 1 tablet (5 mg total) by mouth daily.     ciprofloxacin 500 MG tablet  Commonly known as:  CIPRO  Take 1 tablet (500 mg total) by mouth 2 (two) times daily.     DULoxetine 20 MG capsule  Commonly known as:  CYMBALTA  Take 1 capsule (20 mg total) by mouth daily.     HYDROcodone-acetaminophen 5-325 MG per tablet  Commonly known as:  NORCO/VICODIN  Take 1 tablet by mouth every 6 (six) hours as needed for severe pain.     ibuprofen 800 MG tablet  Commonly known as:  ADVIL,MOTRIN  Take 1 tablet (800 mg total) by mouth every 8 (eight) hours as needed.     naproxen 500 MG tablet  Commonly known as:  NAPROSYN  Take 1 tablet (500 mg  total) by mouth 2 (two) times daily with a meal.     omeprazole 20 MG capsule  Commonly known as:  PRILOSEC  Take 1 capsule (20 mg total) by mouth daily.     ondansetron 4 MG tablet  Commonly known as:  ZOFRAN  Take 1 tablet (4 mg total) by mouth 2 (two) times daily as needed for nausea or vomiting.     Vitamin D (Ergocalciferol) 50000 UNITS Caps capsule  Commonly known as:  DRISDOL  Take 1 capsule (50,000 Units total) by mouth every 7 (seven) days.     Vitamin D (Ergocalciferol) 50000 UNITS Caps capsule  Commonly known as:  DRISDOL  Take 1 capsule (50,000 Units total) by mouth every 7 (seven) days.        No orders of the defined types were placed in this encounter.    Immunization History  Administered Date(s) Administered  . Hepatitis B, adult/adol-2 dose 09/06/2014, 10/07/2014    Family History  Problem Relation Age of Onset  . Hyperlipidemia Father   . Hyperlipidemia Mother   . Colon cancer Neg Hx   . Esophageal cancer Neg Hx   . Rectal cancer Neg Hx   . Stomach cancer Neg Hx     History  Substance Use Topics  . Smoking status: Never Smoker   . Smokeless tobacco: Never Used  . Alcohol Use: No     Comment: pt reports not using any alcohol at this time    Review of Systems   As noted in HPI  Filed Vitals:   01/28/15 0948  BP: 129/89  Pulse: 98  Temp: 98.8 F (37.1 C)  Resp: 15    Physical Exam  Physical Exam  Constitutional: No distress.  Eyes: EOM are normal. Pupils are equal, round, and reactive to light.  Cardiovascular: Normal rate and regular rhythm.   Pulmonary/Chest: Breath sounds normal. No respiratory distress. He has no wheezes. He has no rales.  Abdominal: Soft. There is no tenderness. There is no rebound.  No CVA tenderness  Musculoskeletal: He exhibits no edema.    CBC    Component Value Date/Time   WBC 12.3* 01/21/2015 0830   RBC 4.41 01/21/2015 0830   HGB 13.1 01/21/2015 0830   HCT 39.6 01/21/2015 0830   PLT 161  01/21/2015 0830   MCV 89.8 01/21/2015 0830   LYMPHSABS 0.9 01/21/2015 0830   MONOABS 0.6 01/21/2015 0830   EOSABS 0.0 01/21/2015 0830   BASOSABS 0.0 01/21/2015 0830    CMP     Component Value Date/Time   NA 138 01/21/2015 0830   K 4.3 01/21/2015 0830   CL 102 01/21/2015 0830   CO2 24 01/21/2015 0830   GLUCOSE 90 01/21/2015 0830   BUN 16 01/21/2015 0830   CREATININE 1.32* 01/21/2015 0830   CREATININE 0.93 12/17/2013 0953   CALCIUM 9.2 01/21/2015 0830   PROT 7.8 01/21/2015 0830   ALBUMIN 4.2 01/21/2015 0830   AST 30 01/21/2015 0830   ALT 24 01/21/2015 0830   ALKPHOS 67 01/21/2015 0830   BILITOT 1.1 01/21/2015 0830   GFRNONAA >60 01/21/2015 0830   GFRNONAA >89 12/17/2013 0953   GFRAA >60 01/21/2015 0830   GFRAA >89 12/17/2013 0953    Lab Results  Component Value Date/Time   CHOL 206* 12/17/2013 09:53 AM    No results found for: HGBA1C  Lab Results  Component Value Date/Time   AST 30 01/21/2015 08:30 AM    Assessment and Plan  Calculus of ureter Patient has passed the stone, I have advised patient to drink plenty of water to prevent dehydration, patient develops another stone will need to see a urologist  Incidental lung nodule, > 47mm and < 47mm Patient does not smoke cigarettes,Will repeat CT scan in 6 months ( first week of December)  Renal insufficiency - Plan:advise patient to avoid NSAIDs, repeat  blood history COMPLETE METABOLIC PANEL WITH GFR  Essential hypertension - Plan: blood pressure is well controlled continue with amlodipine  COMPLETE METABOLIC PANEL WITH GFR    Return in about 3 months (around 04/30/2015), or if symptoms worsen or fail to improve.   This note has been created with Surveyor, quantity. Any transcriptional errors are unintentional.    Lorayne Marek, MD

## 2015-01-29 ENCOUNTER — Telehealth: Payer: Self-pay

## 2015-01-29 NOTE — Telephone Encounter (Signed)
Patient not available Message left on voice mail to return our call 

## 2015-01-29 NOTE — Telephone Encounter (Signed)
-----   Message from Lorayne Marek, MD sent at 01/29/2015  9:27 AM EDT ----- Call and let the patient know that his creatinine is improved and is in normal range.

## 2015-05-30 ENCOUNTER — Other Ambulatory Visit: Payer: Self-pay | Admitting: Internal Medicine

## 2015-06-06 ENCOUNTER — Ambulatory Visit: Payer: Self-pay | Attending: Internal Medicine | Admitting: Internal Medicine

## 2015-06-06 ENCOUNTER — Encounter: Payer: Self-pay | Admitting: Internal Medicine

## 2015-06-06 VITALS — BP 109/76 | HR 103 | Temp 99.0°F | Resp 16 | Ht 69.0 in | Wt 233.2 lb

## 2015-06-06 DIAGNOSIS — F32A Depression, unspecified: Secondary | ICD-10-CM

## 2015-06-06 DIAGNOSIS — M5441 Lumbago with sciatica, right side: Secondary | ICD-10-CM | POA: Insufficient documentation

## 2015-06-06 DIAGNOSIS — F329 Major depressive disorder, single episode, unspecified: Secondary | ICD-10-CM | POA: Insufficient documentation

## 2015-06-06 DIAGNOSIS — R51 Headache: Secondary | ICD-10-CM | POA: Insufficient documentation

## 2015-06-06 DIAGNOSIS — I10 Essential (primary) hypertension: Secondary | ICD-10-CM | POA: Insufficient documentation

## 2015-06-06 DIAGNOSIS — Z9114 Patient's other noncompliance with medication regimen: Secondary | ICD-10-CM | POA: Insufficient documentation

## 2015-06-06 DIAGNOSIS — Z23 Encounter for immunization: Secondary | ICD-10-CM | POA: Insufficient documentation

## 2015-06-06 LAB — POCT GLYCOSYLATED HEMOGLOBIN (HGB A1C): Hemoglobin A1C: 5

## 2015-06-06 MED ORDER — AMLODIPINE BESYLATE 5 MG PO TABS: 5.0000 mg | ORAL_TABLET | Freq: Every day | ORAL | Status: DC

## 2015-06-06 MED ORDER — DULOXETINE HCL 20 MG PO CPEP: 40.0000 mg | ORAL_CAPSULE | Freq: Every day | ORAL | Status: DC

## 2015-06-06 MED ORDER — METHOCARBAMOL 500 MG PO TABS: 250.0000 mg | ORAL_TABLET | Freq: Two times a day (BID) | ORAL | Status: DC

## 2015-06-06 NOTE — Progress Notes (Signed)
Patient here for follow up on his HTN Patient is also in need of some medication refills

## 2015-06-06 NOTE — Progress Notes (Signed)
[name], male   DOB: 1966/03/21, 49 y.o.   MRN: [id number] I have seen this patient with student and I agree with her assessment and plan.  Lance Bosch, NP 06/06/2015 5:01 PM

## 2015-06-06 NOTE — Progress Notes (Addendum)
Subjective:    [name], male    DOB: 11/10/1965, 49 y.o.   MRN: [id number]  HPI  Jesse Esparza is a 49 year old male, who is here today for multiple concerns. Patient has significant history for HTN, Obesity and Depression. Patient states he has been taking his mothers medication for BP since he has been out of his medication. He has been out of his Amlodipine for 1 month.   Patient also states he has been taking two 20 mg Cymbalta for the last several days instead of the prescribed 20 mg. He states that his Cymbalta helped his depression when he first started it but now feels like it is not helping as much. He has several life stressors that include caring for two sick parents. He denies SI.                   Patient states he has occasional headaches that he describes as pounding with neck tension. Headaches are located primarily in the occipital region with pain level 5 of 10. Patient attributes headaches to stress. Headaches have been present intermittently for 6 months. He denies nausea, vomiting, photophobia.   Patient also with complaints low back pain radiating to his right knee, which is worse with movement. Back pain has been present for 7 years and described as a sharp shooting pain. Patient states he has taken ibuprofen with minimal relief for headache and back pain.    Family History  Problem Relation Age of Onset  . Hyperlipidemia Father   . Hyperlipidemia Mother   . Colon cancer Neg Hx   . Esophageal cancer Neg Hx   . Rectal cancer Neg Hx   . Stomach cancer Neg Hx    Social History   Social History  . Marital Status: Single    Spouse Name: N/A  . Number of Children: N/A  . Years of Education: N/A   Social History Main Topics  . Smoking status: Never Smoker   . Smokeless tobacco: Never Used  . Alcohol Use: No     Comment: pt reports not using any alcohol at this time  . Drug Use: No  . Sexual Activity: Not Asked   Other Topics Concern  .  None   Social History Narrative    Review of Systems  Constitutional: Positive for activity change, appetite change and fatigue. Negative for fever, chills, diaphoresis and unexpected weight change.  HENT: Negative for congestion, dental problem, drooling, ear discharge, ear pain, facial swelling, hearing loss, mouth sores, nosebleeds, postnasal drip, rhinorrhea and sinus pressure.   Eyes: Positive for visual disturbance. Negative for photophobia, pain, discharge, redness and itching.       With headache  Respiratory: Positive for shortness of breath. Negative for apnea, cough, choking, chest tightness, wheezing and stridor.        With activity  Cardiovascular: Positive for chest pain. Negative for palpitations and leg swelling.  Gastrointestinal: Positive for abdominal pain. Negative for nausea, vomiting, diarrhea, constipation, blood in stool, abdominal distention, anal bleeding and rectal pain.  Endocrine: Negative for cold intolerance, heat intolerance, polydipsia, polyphagia and polyuria.  Genitourinary: Negative for dysuria, urgency, frequency, hematuria, flank pain, decreased urine volume, discharge, penile swelling, scrotal swelling, enuresis, difficulty urinating, genital sores, penile pain and testicular pain.  Musculoskeletal: Positive for back pain. Negative for myalgias, joint swelling, arthralgias, gait problem, neck pain and neck stiffness.  Skin: Negative for color change and pallor.  Allergic/Immunologic: Negative for environmental allergies,  food allergies and immunocompromised state.  Neurological: Positive for headaches. Negative for dizziness, tremors, seizures, syncope, facial asymmetry, speech difficulty, weakness, light-headedness and numbness.  Hematological: Negative for adenopathy. Does not bruise/bleed easily.  Psychiatric/Behavioral: Positive for sleep disturbance. Negative for suicidal ideas, hallucinations, behavioral problems, confusion, self-injury, dysphoric  mood, decreased concentration and agitation. The patient is nervous/anxious. The patient is not hyperactive.    Filed Vitals:   06/06/15 1400  BP: 109/76  Pulse: 103  Temp: 99 F (37.2 C)  Resp: 16        Objective:   Physical Exam  Constitutional: He is oriented to person, place, and time. He appears well-developed and well-nourished.  HENT:  Head: Normocephalic and atraumatic.  Right Ear: External ear normal.  Left Ear: External ear normal.  Nose: Nose normal.  Mouth/Throat: Oropharynx is clear and moist.  Eyes: Conjunctivae and EOM are normal. Pupils are equal, round, and reactive to light.  Neck: Normal range of motion. Neck supple. No JVD present. No thyromegaly present.  Cardiovascular: Normal rate, regular rhythm, normal heart sounds and intact distal pulses.   Pulmonary/Chest: Effort normal and breath sounds normal.  Abdominal: Soft. Bowel sounds are normal.  Musculoskeletal: Normal range of motion. He exhibits no edema.  Lymphadenopathy:    He has no cervical adenopathy.  Neurological: He is alert and oriented to person, place, and time.  Skin: Skin is warm and dry.  Psychiatric: He has a normal mood and affect. His behavior is normal. Judgment and thought content normal.      Assessment & Plan:  Jesse Esparza was seen today for follow-up.  Diagnoses and all orders for this visit:  Essential hypertension, benign -     amLODipine (NORVASC) 5 MG tablet; Take 1 tablet (5 mg total) by mouth daily.       We have discussed target BP range and blood pressure goal. I have advised patient to check BP regularly and to call us back or report to clinic if the numbers are consistently higher than 140/90. We discussed the importance of compliance with medical therapy and DASH diet recommended, consequences of uncontrolled hypertension discussed.  Patient was counseled extensively about importance of taking medications only prescribed for him. Patient verbalized understanding.    Midline low back pain with right-sided sciatica -     methocarbamol (ROBAXIN) 500 MG tablet; Take 0.5-1 tablets (250-500 mg total) by mouth 2 (two) times daily. Once patient gets orange card we will refer him to Orthopedics.   Depression -  DULoxetine (CYMBALTA) 20 MG capsule; Take 2 capsules (40 mg total) by mouth daily.       -     Patient with depression screening PHQ 9  results of 20 and GAD results of 12. Patient cymbalta increased to 40 mg qd today. Patient counseled on taking medication only as directed/prescribe and dangers of taking more than prescribed. Will follow up with patient in 6 weeks.  Morbid obesity, unspecified obesity type ([place])     - Patient was counseled extensively on the importance of diet and exercise. Patient verbalizes understanding  Need for Tdap vaccination -     HgB A1c -     Tdap vaccine greater than or equal to 7yo IM  Return in about 3 months (around 09/06/2015) for Hypertension.   Clois Dupes, RN, BSN, [place] [facility] [place] 06/06/2015 3:14 PM

## 2015-06-06 NOTE — Patient Instructions (Signed)
There is not a 40 mg tablet for Cymbalta so you will have to continue taking two 20 mg tablets.

## 2015-06-11 ENCOUNTER — Ambulatory Visit: Payer: Self-pay | Attending: Family Medicine | Admitting: Pharmacist

## 2015-06-11 ENCOUNTER — Ambulatory Visit: Payer: Self-pay | Attending: Family Medicine

## 2015-06-11 DIAGNOSIS — Z23 Encounter for immunization: Secondary | ICD-10-CM | POA: Insufficient documentation

## 2015-06-11 MED ORDER — HEPATITIS B VAC RECOMBINANT 10 MCG/0.5ML IM INJ
0.5000 mL | INJECTION | Freq: Once | INTRAMUSCULAR | Status: AC
  Administered 2015-06-11: 0.5 mL via INTRAMUSCULAR

## 2015-06-11 NOTE — Progress Notes (Signed)
Patient presents today for the 3rd Hepatitis B vaccination. The second vaccination was in February 2016.  Per the CDC:  Interrupted Vaccine Schedules   When the hepatitis B vaccine schedule is interrupted, the vaccine series does not need to be restarted.   If the series is interrupted after the first dose, the second dose should be administered as soon as possible, and the second and third doses should be separated by an interval of at least 8 weeks.   If only the third dose has been delayed, it should be administered as soon as possible.   Therefore, this will be the final dose in the hepatitis B series for this patient.   Nicoletta Ba, PharmD, BCPS, [place] [facility] [phone] .

## 2015-07-09 ENCOUNTER — Ambulatory Visit: Payer: Self-pay | Attending: Internal Medicine | Admitting: Internal Medicine

## 2015-07-09 ENCOUNTER — Encounter: Payer: Self-pay | Admitting: Internal Medicine

## 2015-07-09 VITALS — BP 111/72 | HR 109 | Temp 98.0°F | Resp 16 | Ht 69.0 in | Wt 237.6 lb

## 2015-07-09 DIAGNOSIS — R0683 Snoring: Secondary | ICD-10-CM

## 2015-07-09 DIAGNOSIS — M5441 Lumbago with sciatica, right side: Secondary | ICD-10-CM

## 2015-07-09 DIAGNOSIS — F32A Depression, unspecified: Secondary | ICD-10-CM

## 2015-07-09 DIAGNOSIS — F329 Major depressive disorder, single episode, unspecified: Secondary | ICD-10-CM

## 2015-07-09 DIAGNOSIS — E669 Obesity, unspecified: Secondary | ICD-10-CM

## 2015-07-09 MED ORDER — METHOCARBAMOL 500 MG PO TABS: 250.0000 mg | ORAL_TABLET | Freq: Two times a day (BID) | ORAL | Status: DC

## 2015-07-09 MED ORDER — NAPROXEN 500 MG PO TABS: 500.0000 mg | ORAL_TABLET | Freq: Two times a day (BID) | ORAL | Status: DC

## 2015-07-09 NOTE — Progress Notes (Signed)
Patient is here for a concern when he sleeps Patient states he had an episode that started about two weeks ago Patient states when he sleeps at night some times he feels he can not wake up Gets a weird sensation from his feet up to his head and wakes up gasping for air Both parents has a history of sleep apnea

## 2015-07-09 NOTE — Progress Notes (Signed)
[name], male   DOB: 10-Nov-1965, 49 y.o.   MRN: [id number]  CC: problems breathing while sleeping  HPI: Jesse Esparza is a 49 y.o. male here today for a follow up visit.  Patient has past medical history of HTN, depression, and obesity. Patient reports that for the past couple of weeks he has been having a "weird sensation" in the middle of the night. He reports a tingling sensation that runs from his knees to his head and makes him feel like he is paralyzed. He states that he feels like he wants to wake up but cannot. He states that he later gets a sensation of his head shrinking and then he feels like he is unable to catch his breath. He has been told that he snores loudly and wakes up un-refreshed. Of note he states that he often wakes up daily around 3 or 4 in the morning daily. He was on depression medication but stopped because he felt like he no longer needed medication.  He now has [facility] and would like a referral for his back pain to Orthopedics.   Patient has No headache, No chest pain, No abdominal pain - No Nausea, No new weakness tingling or numbness, No Cough - SOB.  No Known Allergies Past Medical History  Diagnosis Date  . Hypercholesteremia   . Hypertension    Current Outpatient Prescriptions on File Prior to Visit  Medication Sig Dispense Refill  . amLODipine (NORVASC) 5 MG tablet Take 1 tablet (5 mg total) by mouth daily. 90 tablet 1  . DULoxetine (CYMBALTA) 20 MG capsule Take 2 capsules (40 mg total) by mouth daily. 30 capsule 3  . ibuprofen (ADVIL,MOTRIN) 800 MG tablet Take 1 tablet (800 mg total) by mouth every 8 (eight) hours as needed. 30 tablet 1  . methocarbamol (ROBAXIN) 500 MG tablet Take 0.5-1 tablets (250-500 mg total) by mouth 2 (two) times daily. 30 tablet 1  . omeprazole (PRILOSEC) 20 MG capsule Take 1 capsule (20 mg total) by mouth daily. 30 capsule 3  . ciprofloxacin (CIPRO) 500 MG tablet Take 1 tablet (500 mg total) by mouth 2  (two) times daily. (Patient not taking: Reported on 01/21/2015) 10 tablet 0  . HYDROcodone-acetaminophen (NORCO/VICODIN) 5-325 MG per tablet Take 1 tablet by mouth every 6 (six) hours as needed for severe pain. 5 tablet 0  . naproxen (NAPROSYN) 500 MG tablet Take 1 tablet (500 mg total) by mouth 2 (two) times daily with a meal. 30 tablet 2  . ondansetron (ZOFRAN) 4 MG tablet Take 1 tablet (4 mg total) by mouth 2 (two) times daily as needed for nausea or vomiting. 4 tablet 0  . Vitamin D, Ergocalciferol, (DRISDOL) 50000 UNITS CAPS capsule Take 1 capsule (50,000 Units total) by mouth every 7 (seven) days. (Patient not taking: Reported on 01/21/2015) 12 capsule 3  . Vitamin D, Ergocalciferol, (DRISDOL) 50000 UNITS CAPS capsule Take 1 capsule (50,000 Units total) by mouth every 7 (seven) days. 12 capsule 0   No current facility-administered medications on file prior to visit.   Family History  Problem Relation Age of Onset  . Hyperlipidemia Father   . Hyperlipidemia Mother   . Colon cancer Neg Hx   . Esophageal cancer Neg Hx   . Rectal cancer Neg Hx   . Stomach cancer Neg Hx    Social History   Social History  . Marital Status: Single    Spouse Name: N/A  . Number of Children: N/A  .  Years of Education: N/A   Occupational History  . Not on file.   Social History Main Topics  . Smoking status: Never Smoker   . Smokeless tobacco: Never Used  . Alcohol Use: No     Comment: pt reports not using any alcohol at this time  . Drug Use: No  . Sexual Activity: Not on file   Other Topics Concern  . Not on file   Social History Narrative    Review of Systems: Other than what is stated in HPI, all other systems are negative.   Objective:   Filed Vitals:   07/09/15 1434  BP: 111/72  Pulse: 109  Temp: 98 F (36.7 C)  Resp: 16    Physical Exam  Constitutional: He is oriented to person, place, and time.  Central obesity  Neck: No thyromegaly present.  Cardiovascular: Normal  rate, regular rhythm and normal heart sounds.   Pulmonary/Chest: Effort normal and breath sounds normal.  Musculoskeletal: Normal range of motion. He exhibits no tenderness.  Neurological: He is alert and oriented to person, place, and time. No cranial nerve deficit.  Psychiatric: He has a normal mood and affect.     Lab Results  Component Value Date   WBC 12.3* 01/21/2015   HGB 13.1 01/21/2015   HCT 39.6 01/21/2015   MCV 89.8 01/21/2015   PLT 161 01/21/2015   Lab Results  Component Value Date   CREATININE 0.93 01/28/2015   BUN 11 01/28/2015   NA 137 01/28/2015   K 4.1 01/28/2015   CL 98 01/28/2015   CO2 27 01/28/2015    Lab Results  Component Value Date   HGBA1C 5.0 06/06/2015   Lipid Panel     Component Value Date/Time   CHOL 206* 12/17/2013 0953   TRIG 147 12/17/2013 0953   HDL 43 12/17/2013 0953   CHOLHDL 4.8 12/17/2013 0953   VLDL 29 12/17/2013 0953   LDLCALC 134* 12/17/2013 0953       Assessment and plan:   Ren was seen today for follow-up.  Diagnoses and all orders for this visit:  Depression Many of patient systems appear to be related to depression/anxiety. I have advised patient to start back taking his medication and I have given him the information to [facility] for counseling and medication management.     PHQ-9 of 14 and GAD-7 of 8  Snores -     Split night study; Future Will rule out sleep apnea.   Obesity He is at high risk for sleep apnea. I have stressed weight loss to patient to decrease risk factors and it may improve his back pain.   Midline low back pain with right-sided sciatica -     AMB referral to orthopedics -     methocarbamol (ROBAXIN) 500 MG tablet; Take 0.5-1 tablets (250-500 mg total) by mouth 2 (two) times daily. -     naproxen (NAPROSYN) 500 MG tablet; Take 1 tablet (500 mg total) by mouth 2 (two) times daily with a meal.  Return in about 6 months (around 01/06/2016) for Hypertension.      Lance Bosch,  [place] [facility] [phone] 07/09/2015, 3:09 PM

## 2015-08-11 ENCOUNTER — Telehealth: Payer: Self-pay | Admitting: Internal Medicine

## 2015-08-11 NOTE — Telephone Encounter (Signed)
Patient called stating that he has been approved for financial assistance to proceed with his referral.

## 2015-08-12 NOTE — Telephone Encounter (Signed)
I spoke to Mr Geddie and he don't have the letter yet . He is going to contact billing

## 2015-09-02 ENCOUNTER — Ambulatory Visit (HOSPITAL_BASED_OUTPATIENT_CLINIC_OR_DEPARTMENT_OTHER): Payer: Self-pay | Attending: Internal Medicine

## 2015-09-02 DIAGNOSIS — E669 Obesity, unspecified: Secondary | ICD-10-CM | POA: Insufficient documentation

## 2015-09-02 DIAGNOSIS — G4733 Obstructive sleep apnea (adult) (pediatric): Secondary | ICD-10-CM | POA: Insufficient documentation

## 2015-09-02 DIAGNOSIS — R0683 Snoring: Secondary | ICD-10-CM | POA: Insufficient documentation

## 2015-09-07 DIAGNOSIS — R0683 Snoring: Secondary | ICD-10-CM

## 2015-09-07 DIAGNOSIS — E6609 Other obesity due to excess calories: Secondary | ICD-10-CM

## 2015-09-07 NOTE — Progress Notes (Signed)
   NAME: Jesse Esparza DATE OF BIRTH:  1966/06/03 MEDICAL RECORD NUMBER [id number]  LOCATION: [facility]  PHYSICIAN: Ryna Beckstrom D  DATE OF STUDY: 09/02/2015 CLINICAL INFORMATION Sleep Study Type: NPSG Indication for sleep study: Obesity, Snoring Epworth Sleepiness Score: 5  SLEEP STUDY TECHNIQUE As per the AASM Manual for the Scoring of Sleep and Associated Events v2.3 (April 2016) with a hypopnea requiring 4% desaturations. The channels recorded and monitored were frontal, central and occipital EEG, electrooculogram (EOG), submentalis EMG (chin), nasal and oral airflow, thoracic and abdominal wall motion, anterior tibialis EMG, snore microphone, electrocardiogram, and pulse oximetry.  MEDICATIONS Patient's medications include: charted for review Medications self-administered by patient during sleep study : No sleep medicine administered.  SLEEP ARCHITECTURE The study was initiated at 10:01:14 PM and ended at 4:16:58 AM. Sleep onset time was 15.0 minutes and the sleep efficiency was 78.6%. The total sleep time was 295.5 minutes. Stage REM latency was 157.0 minutes. The patient spent 33.50% of the night in stage N1 sleep, 53.98% in stage N2 sleep, 3.05% in stage N3 and 9.48% in REM. Alpha intrusion was absent. Supine sleep was 37.43%. Wake after sleep onset 65 minutes  RESPIRATORY PARAMETERS The overall apnea/hypopnea index (AHI) was 13.6 per hour. There were 37 total apneas, including 31 obstructive, 5 central and 1 mixed apneas. There were 30 hypopneas and 119 RERAs. The AHI during Stage REM sleep was 45.0 per hour. AHI while supine was 26.6 per hour. The mean oxygen saturation was 95.45%. The minimum SpO2 during sleep was 78.00%. Moderate snoring was noted during this study.  CARDIAC DATA The 2 lead EKG demonstrated sinus rhythm. The mean heart rate was 68.74 beats per minute. Other EKG findings include: None.  LEG MOVEMENT DATA The total PLMS were  4 with a resulting PLMS index of 0.81. Associated arousal with leg movement index was 0.4 .  IMPRESSIONS - Mild obstructive sleep apnea occurred during this study (AHI = 13.6/h). - There were not enough early events to meet protocol requirements for split CPAP titration - No significant central sleep apnea occurred during this study (CAI = 1.0/h). - Moderate oxygen desaturation was noted during this study (Min O2 = 78.00%). - The patient snored with Moderate snoring volume. - No cardiac abnormalities were noted during this study. - Clinically significant periodic limb movements did not occur during sleep. No significant associated arousals.  DIAGNOSIS - Obstructive Sleep Apnea (327.23 [G47.33 ICD-10]) -   RECOMMENDATIONS - Therapeutic CPAP titration to determine optimal pressure required to alleviate sleep disordered breathing. - Positional therapy avoiding supine position during sleep. - Avoid alcohol, sedatives and other CNS depressants that may worsen sleep apnea and disrupt normal sleep architecture. - Sleep hygiene should be reviewed to assess factors that may improve sleep quality. - Weight management and regular exercise should be initiated or continued if appropriate.   Deneise Lever Diplomate, American Board of Sleep Medicine  ELECTRONICALLY SIGNED ON:  09/07/2015, 3:00 PM [place] PH: [phone]   FX: [phone] [place]

## 2015-09-22 ENCOUNTER — Other Ambulatory Visit (HOSPITAL_COMMUNITY): Payer: Self-pay | Admitting: Orthopaedic Surgery

## 2015-09-22 DIAGNOSIS — M545 Low back pain: Secondary | ICD-10-CM

## 2015-09-29 ENCOUNTER — Ambulatory Visit (HOSPITAL_COMMUNITY)
Admission: RE | Admit: 2015-09-29 | Discharge: 2015-09-29 | Disposition: A | Discharge status: Home / Self Care | Payer: Self-pay | Source: Ambulatory Visit | Attending: Orthopaedic Surgery | Admitting: Orthopaedic Surgery

## 2015-09-29 DIAGNOSIS — M5136 Other intervertebral disc degeneration, lumbar region: Secondary | ICD-10-CM | POA: Insufficient documentation

## 2015-09-29 DIAGNOSIS — M4806 Spinal stenosis, lumbar region: Secondary | ICD-10-CM | POA: Insufficient documentation

## 2015-09-29 DIAGNOSIS — M545 Low back pain: Secondary | ICD-10-CM | POA: Insufficient documentation

## 2015-12-04 ENCOUNTER — Encounter: Payer: Self-pay | Admitting: Internal Medicine

## 2015-12-04 ENCOUNTER — Ambulatory Visit: Payer: Self-pay | Attending: Internal Medicine | Admitting: Internal Medicine

## 2015-12-04 VITALS — BP 108/74 | HR 94 | Temp 98.2°F | Resp 16 | Ht 67.0 in

## 2015-12-04 DIAGNOSIS — K068 Other specified disorders of gingiva and edentulous alveolar ridge: Secondary | ICD-10-CM

## 2015-12-04 DIAGNOSIS — R112 Nausea with vomiting, unspecified: Secondary | ICD-10-CM

## 2015-12-04 DIAGNOSIS — Z79899 Other long term (current) drug therapy: Secondary | ICD-10-CM | POA: Insufficient documentation

## 2015-12-04 DIAGNOSIS — N5082 Scrotal pain: Secondary | ICD-10-CM

## 2015-12-04 DIAGNOSIS — K219 Gastro-esophageal reflux disease without esophagitis: Secondary | ICD-10-CM

## 2015-12-04 DIAGNOSIS — I10 Essential (primary) hypertension: Secondary | ICD-10-CM | POA: Insufficient documentation

## 2015-12-04 DIAGNOSIS — K055 Other periodontal diseases: Secondary | ICD-10-CM

## 2015-12-04 MED ORDER — OMEPRAZOLE 20 MG PO CPDR: 20.0000 mg | DELAYED_RELEASE_CAPSULE | Freq: Every day | ORAL | Status: DC

## 2015-12-04 NOTE — Progress Notes (Signed)
Patient's here for abd pain with nausea and vomiting x2wks now.  Patient also c/o bad breath x6mo.   Patient states that he feels like something stuck in his throat with d/c of  blood.

## 2015-12-04 NOTE — Progress Notes (Signed)
[name], male   DOB: [birth year], 50 y.o.   MRN: [id number]  CC: nausea,vomiting  HPI: Jesse Esparza is a 50 y.o. male here today for a follow up visit.  Patient has past medical history of HTN and HLD. Patient states that he has been having symptoms of nausea and vomiting for the past two weeks. He has associated feelings of something stuck in his throat. He feels like his stomach is bloated and he has been having severe pain in his groin and right testicle when he attempts to lift something heavy. He is concerned because in the past he has had a surgery on his left testicle but does not remember the exact reason.  He has some bleeding in his gums and has noticed a foul odor from his mouth. He would like a dental referral.   No Known Allergies Past Medical History  Diagnosis Date  . Hypercholesteremia   . Hypertension    Current Outpatient Prescriptions on File Prior to Visit  Medication Sig Dispense Refill  . amLODipine (NORVASC) 5 MG tablet TAKE 1 TABLET BY MOUTH ONCE DAILY (Patient not taking: Reported on 12/04/2015) 90 tablet 1  . amLODipine (NORVASC) 5 MG tablet Take 1 tablet (5 mg total) by mouth daily. (Patient not taking: Reported on 12/04/2015) 90 tablet 1  . DULoxetine (CYMBALTA) 20 MG capsule Take 2 capsules (40 mg total) by mouth daily. (Patient not taking: Reported on 12/04/2015) 30 capsule 3  . ibuprofen (ADVIL,MOTRIN) 800 MG tablet Take 1 tablet (800 mg total) by mouth every 8 (eight) hours as needed. (Patient not taking: Reported on 12/04/2015) 30 tablet 1  . methocarbamol (ROBAXIN) 500 MG tablet Take 0.5-1 tablets (250-500 mg total) by mouth 2 (two) times daily. (Patient not taking: Reported on 12/04/2015) 30 tablet 1  . naproxen (NAPROSYN) 500 MG tablet Take 1 tablet (500 mg total) by mouth 2 (two) times daily with a meal. (Patient not taking: Reported on 12/04/2015) 30 tablet 2  . omeprazole (PRILOSEC) 20 MG capsule Take 1 capsule (20 mg total) by mouth daily.  (Patient not taking: Reported on 12/04/2015) 30 capsule 3   No current facility-administered medications on file prior to visit.   Family History  Problem Relation Age of Onset  . Hyperlipidemia Father   . Hyperlipidemia Mother   . Colon cancer Neg Hx   . Esophageal cancer Neg Hx   . Rectal cancer Neg Hx   . Stomach cancer Neg Hx    Social History   Social History  . Marital Status: Single    Spouse Name: N/A  . Number of Children: N/A  . Years of Education: N/A   Occupational History  . Not on file.   Social History Main Topics  . Smoking status: Never Smoker   . Smokeless tobacco: Never Used  . Alcohol Use: No     Comment: pt reports not using any alcohol at this time  . Drug Use: No  . Sexual Activity: Not on file   Other Topics Concern  . Not on file   Social History Narrative    Review of Systems: Other than what is stated in HPI, all other systems are negative.   Objective:   Filed Vitals:   12/04/15 1131  BP: 108/74  Pulse: 94  Temp: 98.2 F (36.8 C)  Resp: 16    Physical Exam  Cardiovascular: Normal rate, regular rhythm and normal heart sounds.   Pulmonary/Chest: Effort normal and breath sounds normal.  Abdominal:  Soft. Bowel sounds are normal. There is tenderness (epigastric).  Genitourinary: Right testis shows mass and tenderness.  Due to body habitus unable to determine if hernia is present. Moderate tenderness in right groin and right testicle to light touch  Lymphadenopathy: No inguinal adenopathy noted on the right or left side.  Skin: Skin is warm and dry. No erythema.     Lab Results  Component Value Date   WBC 12.3* 01/21/2015   HGB 13.1 01/21/2015   HCT 39.6 01/21/2015   MCV 89.8 01/21/2015   PLT 161 01/21/2015   Lab Results  Component Value Date   CREATININE 0.93 01/28/2015   BUN 11 01/28/2015   NA 137 01/28/2015   K 4.1 01/28/2015   CL 98 01/28/2015   CO2 27 01/28/2015    Lab Results  Component Value Date   HGBA1C  5.0 06/06/2015   Lipid Panel     Component Value Date/Time   CHOL 206* 12/17/2013 0953   TRIG 147 12/17/2013 0953   HDL 43 12/17/2013 0953   CHOLHDL 4.8 12/17/2013 0953   VLDL 29 12/17/2013 0953   LDLCALC 134* 12/17/2013 0953       Assessment and plan:   Jesse Esparza was seen today for abdominal pain.  Diagnoses and all orders for this visit:  Gastroesophageal reflux disease, esophagitis presence not specified -     Begin omeprazole (PRILOSEC) 20 MG capsule; Take 1 capsule (20 mg total) by mouth daily. I believe he is suffering from acid reflux. Proper treatment may help mouth odor and nausea.   Scrotal pain -     US Scrotum; Future There is a mass present but I am unable to determine if it is a hernia or truly a mass.   Nausea and vomiting, intractability of vomiting not specified, unspecified vomiting type -     CT Abdomen Pelvis Wo Contrast; Future Will rule out bowel obstruction and any other pathology   Bleeding gums -     Ambulatory referral to Dentistry Will send to dentist for periodontal disease which may be a additional component to mouth odor.   Return if symptoms worsen or fail to improve.       Lance Bosch, [place] [facility] [phone] 12/04/2015, 11:56 AM

## 2015-12-05 ENCOUNTER — Encounter (HOSPITAL_COMMUNITY): Payer: Self-pay

## 2015-12-05 ENCOUNTER — Ambulatory Visit (HOSPITAL_COMMUNITY)
Admission: RE | Admit: 2015-12-05 | Discharge: 2015-12-05 | Disposition: A | Discharge status: Home / Self Care | Payer: Self-pay | Source: Ambulatory Visit | Attending: Internal Medicine | Admitting: Internal Medicine

## 2015-12-05 ENCOUNTER — Telehealth: Payer: Self-pay

## 2015-12-05 DIAGNOSIS — K760 Fatty (change of) liver, not elsewhere classified: Secondary | ICD-10-CM | POA: Insufficient documentation

## 2015-12-05 DIAGNOSIS — N503 Cyst of epididymis: Secondary | ICD-10-CM | POA: Insufficient documentation

## 2015-12-05 DIAGNOSIS — N5082 Scrotal pain: Secondary | ICD-10-CM

## 2015-12-05 DIAGNOSIS — K5730 Diverticulosis of large intestine without perforation or abscess without bleeding: Secondary | ICD-10-CM | POA: Insufficient documentation

## 2015-12-05 DIAGNOSIS — R112 Nausea with vomiting, unspecified: Secondary | ICD-10-CM | POA: Insufficient documentation

## 2015-12-05 NOTE — Telephone Encounter (Signed)
-----   Message from Lance Bosch, NP sent at 12/05/2015 10:16 AM EDT ----- CT and scrotal ultrasound are both negative. Unable to explain symptoms. I will place order for Urology consult.

## 2015-12-05 NOTE — Telephone Encounter (Signed)
[place] called [place] interpreter. [place] interpreter Venora Maples [phone]. Interpreter verified name and DOB. Patient was given his results, verbalized he understood with no further questions. Patient requesting results to sent to address on file.

## 2015-12-08 NOTE — Telephone Encounter (Signed)
Called patient, patient verified name and DOB. Patient was asking for results for U/S. Patient was given the number to contact the radiology dept for results. Patient verbalized he understood with no further questions.

## 2016-03-10 ENCOUNTER — Ambulatory Visit: Payer: Self-pay | Attending: Internal Medicine

## 2016-03-19 ENCOUNTER — Encounter: Payer: Self-pay | Admitting: Family Medicine

## 2016-03-19 ENCOUNTER — Ambulatory Visit: Payer: Self-pay | Attending: Family Medicine | Admitting: Family Medicine

## 2016-03-19 VITALS — BP 98/66 | HR 86 | Temp 99.5°F | Resp 20 | Ht 67.0 in | Wt 213.0 lb

## 2016-03-19 DIAGNOSIS — R822 Biliuria: Secondary | ICD-10-CM

## 2016-03-19 DIAGNOSIS — Z9889 Other specified postprocedural states: Secondary | ICD-10-CM | POA: Insufficient documentation

## 2016-03-19 DIAGNOSIS — E669 Obesity, unspecified: Secondary | ICD-10-CM | POA: Insufficient documentation

## 2016-03-19 DIAGNOSIS — K219 Gastro-esophageal reflux disease without esophagitis: Secondary | ICD-10-CM | POA: Insufficient documentation

## 2016-03-19 DIAGNOSIS — Z79899 Other long term (current) drug therapy: Secondary | ICD-10-CM | POA: Insufficient documentation

## 2016-03-19 DIAGNOSIS — R17 Unspecified jaundice: Secondary | ICD-10-CM | POA: Insufficient documentation

## 2016-03-19 DIAGNOSIS — Z6833 Body mass index (BMI) 33.0-33.9, adult: Secondary | ICD-10-CM | POA: Insufficient documentation

## 2016-03-19 DIAGNOSIS — I10 Essential (primary) hypertension: Secondary | ICD-10-CM | POA: Insufficient documentation

## 2016-03-19 LAB — COMPLETE METABOLIC PANEL WITH GFR
ALT: 77 U/L — AB (ref 9–46)
AST: 158 U/L — ABNORMAL HIGH (ref 10–35)
Albumin: 2.7 g/dL — ABNORMAL LOW (ref 3.6–5.1)
Alkaline Phosphatase: 143 U/L — ABNORMAL HIGH (ref 40–115)
BILIRUBIN TOTAL: 7.5 mg/dL — AB (ref 0.2–1.2)
BUN: 8 mg/dL (ref 7–25)
CHLORIDE: 101 mmol/L (ref 98–110)
CO2: 26 mmol/L (ref 20–31)
CREATININE: 0.73 mg/dL (ref 0.70–1.33)
Calcium: 7.9 mg/dL — ABNORMAL LOW (ref 8.6–10.3)
GFR, Est African American: >89 mL/min (ref >=60)
GFR, Est Non African American: >89 mL/min (ref >=60)
GLUCOSE: 73 mg/dL (ref 65–99)
Potassium: 4.5 mmol/L (ref 3.5–5.3)
SODIUM: 138 mmol/L (ref 135–146)
TOTAL PROTEIN: 6.2 g/dL (ref 6.1–8.1)

## 2016-03-19 LAB — POCT URINALYSIS DIP (MANUAL ENTRY)
Glucose, UA: 100 — AB
Leukocytes, UA: NEGATIVE
NITRITE UA: NEGATIVE
PH UA: 6
Protein Ur, POC: 30 — AB
RBC UA: NEGATIVE
Spec Grav, UA: 1.01
Urobilinogen, UA: 4

## 2016-03-19 LAB — BILIRUBIN,DIRECT & INDIRECT (FRACTIONATED)
Bilirubin, Direct: 4.5 mg/dL — ABNORMAL HIGH (ref <=0.2)
Indirect Bilirubin: 3 mg/dL — ABNORMAL HIGH (ref 0.2–1.2)

## 2016-03-19 MED ORDER — ESOMEPRAZOLE MAGNESIUM 40 MG PO CPDR: 40.0000 mg | DELAYED_RELEASE_CAPSULE | Freq: Every day | ORAL | Status: DC

## 2016-03-19 MED FILL — ESOMEPRAZOLE MAG DR 40 MG C: 40 | 30 days supply | Qty: 30 | Fill #0

## 2016-03-19 NOTE — Progress Notes (Signed)
Patient her for digestion concerns, decreased appetite. Weight loss due to decreased appetite. Was referred to Prostate MD but never went Jesse Esparza, Therapist, sports, BSN

## 2016-03-19 NOTE — Patient Instructions (Signed)
Jaundice, Adult Jaundice is a yellowish discoloration of the skin, the whites of the eyes, and mucous membranes. Jaundice can be a sign that the liver or the bile system is not working normally. CAUSES This condition is caused by an increased level of bilirubin in the blood. Bilirubin is a substance that is produced by the normal breakdown of red blood cells. Conditions and activities that can cause an increase in the bilirubin level include:  Viral hepatitis.  Gallstones or other conditions, such as a tumor, that can cause a blockage of bile ducts.  Excessive use of alcohol.  Other liver diseases, such as cirrhosis.  Certain cancers.  Certain infections.  Certain genetic syndromes.  Certain medicines. SYMPTOMS Symptoms of this condition include:  Yellow color to the skin, the whites of the eyes, or mucous membranes.  Dark brown urine.  Stomach pain.  Light or clay-colored stool.  Itchy skin (pruritus). DIAGNOSIS This condition is diagnosed with a medical history, physical exam, and blood tests. You may have additional tests to determine what is causing your bilirubin level to increase. TREATMENT Treatment for jaundice depends on the underlying condition. Treatment may include:  Stopping the use of a certain medicine.  Fluids that are given through an IV tube that is inserted into one of your veins.  Medicines to treat pruritus.  Surgery, if there is blockage of the bile ducts. HOME CARE INSTRUCTIONS  Drink enough fluid to keep your urine clear or pale yellow.  Do not drink alcohol.  Take medicines only as directed by your health care provider.  Keep all follow-up visits as directed by your health care provider. This is important.  You may use skin lotion to relieve itching. SEEK MEDICAL CARE IF:  You have a fever. SEEK IMMEDIATE MEDICAL CARE IF:  Your symptoms suddenly get worse.  You have symptoms for more than 72 hours.  Your pain gets worse.  You  vomit repeatedly.  You become weak or confused.  You develop a severe headache.  You become severely dehydrated. Signs of severe dehydration include:  A very dry mouth.  A rapid, weak pulse.  Rapid breathing.  Blue lips.   This information is not intended to replace advice given to you by your health care provider. Make sure you discuss any questions you have with your health care provider.   Document Released: 08/16/2005 Document Revised: 12/31/2014 Document Reviewed: 08/12/2014 Elsevier Interactive Patient Education 2016 [employer] Inc[employer]  

## 2016-03-20 LAB — HEPATITIS C ANTIBODY: HCV AB: NEGATIVE

## 2016-03-20 LAB — HEPATITIS A ANTIBODY, IGM: Hep A IgM: NONREACTIVE

## 2016-03-23 ENCOUNTER — Other Ambulatory Visit: Payer: Self-pay | Admitting: Family Medicine

## 2016-03-23 ENCOUNTER — Telehealth: Payer: Self-pay | Admitting: Family Medicine

## 2016-03-23 NOTE — Progress Notes (Signed)
Please schedule this right upper quadrant ultrasound and inform the patient. Thank you

## 2016-03-23 NOTE — Telephone Encounter (Signed)
I have placed an order for a right upper quadrant ultrasound for this patient. Could you please schedule this and inform the patient? Thank you.

## 2016-03-23 NOTE — Progress Notes (Signed)
Subjective:  [name], male    DOB: 09-19-1965  Age: 50 y.o. MRN: [id number]  CC: Gastroesophageal Reflux   HPI Jesse Esparza presents To establish care with me as he was previously followed by the nurse practitioner who is no longer with the practice. Medical history significant for hypertension, depression, GERD and he has not been taking any of his medications.  He endorses some lightheadedness and so stopped his amlodipine. His depression is controlled and he does not need any medication he says. He does have some epigastric discomfort but denies pain; admits to some burping excessively, bloating. His family members have noticed yellowing of his eyes and he was advised to have his liver checked. He denies fevers, diarrhea, nausea or vomiting and has no history of sick contact. Has also noticed some decreased appetite and weight loss.  Past Medical History:  Diagnosis Date  . Hypercholesteremia   . Hypertension     Past Surgical History:  Procedure Laterality Date  . left testicular  childhood  . 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 amLODipine (NORVASC) 5 MG tablet TAKE 1 TABLET BY MOUTH ONCE DAILY 90 tablet 1  . amLODipine (NORVASC) 5 MG tablet Take 1 tablet (5 mg total) by mouth daily. (Patient not taking: Reported on 12/04/2015) 90 tablet 1  . DULoxetine (CYMBALTA) 20 MG capsule Take 2 capsules (40 mg total) by mouth daily. (Patient not taking: Reported on 12/04/2015) 30 capsule 3  . omeprazole (PRILOSEC) 20 MG capsule Take 1 capsule (20 mg total) by mouth daily. (Patient not taking: Reported on 03/19/2016) 30 capsule 3   No facility-administered medications prior to visit.     ROS Review of Systems  Constitutional: Positive for appetite change. Negative for activity change.  HENT: Negative for sinus pressure and sore throat.   Eyes: Negative for visual disturbance.    Respiratory: Negative for cough, chest tightness and shortness of breath.   Cardiovascular: Negative for chest pain and leg swelling.  Gastrointestinal: Negative for abdominal distention, abdominal pain, constipation and diarrhea.  Endocrine: Negative.   Genitourinary: Negative for dysuria.  Musculoskeletal: Negative for joint swelling and myalgias.  Skin: Negative for rash.  Allergic/Immunologic: Negative.   Neurological: Negative for weakness, light-headedness and numbness.  Psychiatric/Behavioral: Negative for dysphoric mood and suicidal ideas.    Objective:  BP 98/66   Pulse 86   Temp 99.5 F (37.5 C)   Resp 20   Ht 5\' 7"  (1.702 m)   Wt 213 lb (96.6 kg)   BMI 33.36 kg/m   BP/Weight 03/19/2016 12/04/2015 [id number]  Systolic BP 98 [id number] -  Diastolic BP 66 74 -  Wt. (Lbs) 213 - 230  BMI 33.35 - 39.46      Physical Exam  Constitutional: He is oriented to person, place, and time. He appears well-developed and well-nourished.  Eyes: Scleral icterus is present.  Cardiovascular: Normal rate, normal heart sounds and intact distal pulses.   No murmur heard. Pulmonary/Chest: Effort normal and breath sounds normal. He has no wheezes. He has no rales. He exhibits no tenderness.  Abdominal: Soft. Bowel sounds are normal. He exhibits no distension and no mass. There is no tenderness.  Musculoskeletal: Normal range of motion.  Neurological: He is alert and oriented to person, place, and time.     Assessment & Plan:   1. Bilirubinuria - POCT urinalysis dipstick  2. Jaundice Unknown etiology -  COMPLETE METABOLIC PANEL WITH GFR - Bilirubin,Direct/Indirect(Fractionated) - Hepatitis C antibody, reflex - Hepatitis A Antibody, IGM - Hepatitis A Antibody, Total - Glucose 6 phosphate dehydrogenase - CBC with Differential/Platelet  3. Essential hypertension, benign Controlled Discontinue amlodipine due to hypotension  4. Obesity, unspecified Discussed weight loss and reducing  portion sizes  5. Gastroesophageal reflux disease without esophagitis - esomeprazole (NEXIUM) 40 MG capsule; Take 1 capsule (40 mg total) by mouth daily.  Dispense: 30 capsule; Refill: 3   Meds ordered this encounter  Medications  . esomeprazole (NEXIUM) 40 MG capsule    Sig: Take 1 capsule (40 mg total) by mouth daily.    Dispense:  30 capsule    Refill:  3    Follow-up: Return in about 2 weeks (around 04/02/2016) for Follow-up on jaundice.   Arnoldo Morale MD

## 2016-03-30 ENCOUNTER — Ambulatory Visit (HOSPITAL_COMMUNITY)
Admission: RE | Admit: 2016-03-30 | Discharge: 2016-03-30 | Disposition: A | Discharge status: Home / Self Care | Payer: Self-pay | Source: Ambulatory Visit | Attending: Family Medicine | Admitting: Family Medicine

## 2016-03-30 DIAGNOSIS — K760 Fatty (change of) liver, not elsewhere classified: Secondary | ICD-10-CM | POA: Insufficient documentation

## 2016-03-30 NOTE — Telephone Encounter (Signed)
Pt had RUQ US done at 8:30 am this morning, Tuesday, 03/30/2016.

## 2016-03-31 ENCOUNTER — Other Ambulatory Visit: Payer: Self-pay | Admitting: Family Medicine

## 2016-03-31 DIAGNOSIS — R945 Abnormal results of liver function studies: Secondary | ICD-10-CM

## 2016-03-31 DIAGNOSIS — R7989 Other specified abnormal findings of blood chemistry: Secondary | ICD-10-CM

## 2016-04-01 ENCOUNTER — Ambulatory Visit: Payer: Self-pay | Attending: Family Medicine | Admitting: Family Medicine

## 2016-04-01 ENCOUNTER — Encounter: Payer: Self-pay | Admitting: Family Medicine

## 2016-04-01 DIAGNOSIS — Z79899 Other long term (current) drug therapy: Secondary | ICD-10-CM | POA: Insufficient documentation

## 2016-04-01 DIAGNOSIS — I9589 Other hypotension: Secondary | ICD-10-CM

## 2016-04-01 DIAGNOSIS — I10 Essential (primary) hypertension: Secondary | ICD-10-CM | POA: Insufficient documentation

## 2016-04-01 DIAGNOSIS — R945 Abnormal results of liver function studies: Secondary | ICD-10-CM

## 2016-04-01 DIAGNOSIS — R17 Unspecified jaundice: Secondary | ICD-10-CM

## 2016-04-01 DIAGNOSIS — R0602 Shortness of breath: Secondary | ICD-10-CM | POA: Insufficient documentation

## 2016-04-01 DIAGNOSIS — K219 Gastro-esophageal reflux disease without esophagitis: Secondary | ICD-10-CM | POA: Insufficient documentation

## 2016-04-01 DIAGNOSIS — R7989 Other specified abnormal findings of blood chemistry: Secondary | ICD-10-CM

## 2016-04-01 NOTE — Progress Notes (Signed)
Almost "blacked out yesterday" when power washing house Nauseated today- can't eat dizzy

## 2016-04-01 NOTE — Progress Notes (Signed)
Subjective:  [name], male    DOB: [birth year]  Age: 50 y.o. MRN: [id number]  CC: Hypertension; Gastroesophageal Reflux (nausea when eating); and Fatigue ("feel weak")   HPI Jourdain Vielman this 50 year old male with a history of GERD who was evaluated for jaundice at his last office visit. Blood work revealed elevated liver enzymes and hyperbilirubinemia. Right upper quadrant ultrasound revealed absence of gallstones but presence of gallbladder sludge and hepatic steatosis. Negative hepatitis panel.  He continues to complain of abdominal bloating, early satiety, nausea and shortness of breath on mild-to-moderate exertion. He does have some jaundice. Reflux symptoms have improved on the PPI. He is scheduled to see GI tomorrow morning.  Outpatient Medications Prior to Visit  Medication Sig Dispense Refill  . esomeprazole (NEXIUM) 40 MG capsule Take 1 capsule (40 mg total) by mouth daily. 30 capsule 3   No facility-administered medications prior to visit.     ROS Review of Systems Constitutional: Positive for appetite change. Negative for activity change.  HENT: Negative for sinus pressure and sore throat.   Eyes: Negative for visual disturbance, positive for jaundice  Respiratory: Negative for cough, chest tightness and positive for shortness of breath.   Cardiovascular: Negative for chest pain and leg swelling.  Gastrointestinal: Negative for abdominal distention, abdominal pain, constipation and diarrhea.  Endocrine: Negative.   Genitourinary: Negative for dysuria.  Musculoskeletal: Negative for joint swelling and myalgias.  Skin: Negative for rash.  Allergic/Immunologic: Negative.   Neurological: Negative for weakness, light-headedness and numbness.  Psychiatric/Behavioral: Negative for dysphoric mood and suicidal ideas.   Objective:  BP (!) 90/59 (BP Location: Right Arm, Patient Position: Sitting, Cuff Size: Large)   Pulse 96   Temp 99.2 F (37.3 C)  (Oral)   Ht 5\' 7"  (1.702 m)   Wt 203 lb 6.4 oz (92.3 kg)   SpO2 99%   BMI 31.86 kg/m   BP/Weight 04/01/2016 [id number]  Systolic BP 90 98 [id number]  Diastolic BP 59 66 74  Wt. (Lbs) 203.4 213 -  BMI 31.86 33.35 -      Physical Exam Constitutional: He is oriented to person, place, and time. He appears well-developed and well-nourished.  Eyes: Scleral icterus is present.  Cardiovascular: Normal rate, normal heart sounds and intact distal pulses.   No murmur heard. Pulmonary/Chest: Effort normal and breath sounds normal. He has no wheezes. He has no rales. He exhibits no tenderness.  Abdominal: Soft, slightly distended, Bowel sounds are normal. There is no tenderness.  Musculoskeletal: Normal range of motion.  Neurological: He is alert and oriented to person, place, and time.   CMP Latest Ref Rng & Units 03/19/2016 01/28/2015 01/21/2015  Glucose 65 - 99 mg/dL 73 135(H) 90  BUN 7 - 25 mg/dL 8 11 16   Creatinine 0.70 - 1.33 mg/dL 0.73 0.93 1.32(H)  Sodium 135 - 146 mmol/L 138 137 138  Potassium 3.5 - 5.3 mmol/L 4.5 4.1 4.3  Chloride 98 - 110 mmol/L 101 98 102  CO2 20 - 31 mmol/L 26 27 24   Calcium 8.6 - 10.3 mg/dL 7.9(L) 9.2 9.2  Total Protein 6.1 - 8.1 g/dL 6.2 7.7 7.8  Total Bilirubin 0.2 - 1.2 mg/dL 7.5(H) 0.8 1.1  Alkaline Phos 40 - 115 U/L 143(H) 76 67  AST 10 - 35 U/L 158(H) 18 30  ALT 9 - 46 U/L 77(H) 18 24    CLINICAL DATA:  Elevated LFTs, hyperbilirubinemia since 03/19/2016. History of hepatic steatosis.  EXAM: US ABDOMEN LIMITED - RIGHT  UPPER QUADRANT  COMPARISON:  CT 12/05/2015  FINDINGS: Gallbladder:  Gallbladder contains sludge. Gallbladder wall is 4.1 mm. No stones or pericholecystic fluid. No sonographic Murphy's sign.  Common bile duct:  Diameter: 3.1 mm  Liver:  The liver is echogenic. There is attenuation of the ultrasound wave, poor visualization of the internal hepatic architecture, and loss of definition of the diaphragm. No focal  liver lesions are identified.  IMPRESSION: 1. Gallbladder sludge and gallbladder wall thickening without other evidence for acute cholecystitis. 2. Hepatic steatosis.   Electronically Signed   By: Nolon Nations M.D.   On: 03/30/2016 09:10  Assessment & Plan:   1. Hyperbilirubinemia/ abnormal LFTs/jaundice Unknown etiology Right upper quadrant ultrasound with hepatic steatosis, gallbladder sludge" no cholelithiasis. Does not fully explain extent of bilirubinemia. Will defer to GI.  2. Shortness of breath We'll consider cardiac workup to exclude CHF Cannot exclude underlying GI pathology as possible etiology  3. Other specified hypotension This could explain dizziness He might need to be placed on midodrine if symptoms persist at his next visit   No orders of the defined types were placed in this encounter.   Follow-up: Return in about 2 weeks (around 04/15/2016) for shortness of breath.   Arnoldo Morale MD

## 2016-04-02 ENCOUNTER — Encounter: Payer: Self-pay | Admitting: Gastroenterology

## 2016-04-02 ENCOUNTER — Other Ambulatory Visit (INDEPENDENT_AMBULATORY_CARE_PROVIDER_SITE_OTHER): Payer: Self-pay

## 2016-04-02 ENCOUNTER — Ambulatory Visit (INDEPENDENT_AMBULATORY_CARE_PROVIDER_SITE_OTHER): Payer: Self-pay | Admitting: Gastroenterology

## 2016-04-02 VITALS — BP 100/64 | HR 88 | Ht 66.14 in | Wt 208.1 lb

## 2016-04-02 DIAGNOSIS — K219 Gastro-esophageal reflux disease without esophagitis: Secondary | ICD-10-CM

## 2016-04-02 DIAGNOSIS — R7989 Other specified abnormal findings of blood chemistry: Secondary | ICD-10-CM

## 2016-04-02 DIAGNOSIS — R6881 Early satiety: Secondary | ICD-10-CM

## 2016-04-02 DIAGNOSIS — R17 Unspecified jaundice: Secondary | ICD-10-CM

## 2016-04-02 DIAGNOSIS — R630 Anorexia: Secondary | ICD-10-CM

## 2016-04-02 DIAGNOSIS — R634 Abnormal weight loss: Secondary | ICD-10-CM

## 2016-04-02 DIAGNOSIS — R112 Nausea with vomiting, unspecified: Secondary | ICD-10-CM

## 2016-04-02 DIAGNOSIS — R945 Abnormal results of liver function studies: Secondary | ICD-10-CM

## 2016-04-02 LAB — CBC WITH DIFFERENTIAL/PLATELET
BASOS PCT: 0.2 % (ref 0.0–3.0)
Basophils Absolute: 0 10*3/uL (ref 0.0–0.1)
EOS ABS: 0 10*3/uL (ref 0.0–0.7)
Eosinophils Relative: 0.4 % (ref 0.0–5.0)
HCT: 36.3 % — ABNORMAL LOW (ref 39.0–52.0)
Hemoglobin: 12.5 g/dL — ABNORMAL LOW (ref 13.0–17.0)
LYMPHS ABS: 0.9 10*3/uL (ref 0.7–4.0)
Lymphocytes Relative: 7.6 % — ABNORMAL LOW (ref 12.0–46.0)
MCHC: 34.4 g/dL (ref 30.0–36.0)
MCV: 104.7 fl — ABNORMAL HIGH (ref 78.0–100.0)
MONO ABS: 0.8 10*3/uL (ref 0.1–1.0)
Monocytes Relative: 6.8 % (ref 3.0–12.0)
NEUTROS ABS: 10.4 10*3/uL — AB (ref 1.4–7.7)
Platelets: 157 10*3/uL (ref 150.0–400.0)
RBC: 3.47 Mil/uL — ABNORMAL LOW (ref 4.22–5.81)
RDW: 15.5 % (ref 11.5–15.5)
WBC: 12.3 10*3/uL — AB (ref 4.0–10.5)

## 2016-04-02 LAB — COMPREHENSIVE METABOLIC PANEL
ALT: 62 U/L — ABNORMAL HIGH (ref 0–53)
AST: 124 U/L — ABNORMAL HIGH (ref 0–37)
Albumin: 3.1 g/dL — ABNORMAL LOW (ref 3.5–5.2)
Alkaline Phosphatase: 116 U/L (ref 39–117)
BUN: 12 mg/dL (ref 6–23)
CO2: 27 mEq/L (ref 19–32)
Calcium: 8.9 mg/dL (ref 8.4–10.5)
Chloride: 103 mEq/L (ref 96–112)
Creatinine, Ser: 0.79 mg/dL (ref 0.40–1.50)
GFR: 109.55 mL/min (ref >60.00)
Glucose, Bld: 84 mg/dL (ref 70–99)
Potassium: 4.9 mEq/L (ref 3.5–5.1)
Sodium: 137 mEq/L (ref 135–145)
Total Bilirubin: 4 mg/dL — ABNORMAL HIGH (ref 0.2–1.2)
Total Protein: 7.5 g/dL (ref 6.0–8.3)

## 2016-04-02 LAB — PROTIME-INR
INR: 1.3 ratio — ABNORMAL HIGH (ref 0.8–1.0)
Prothrombin Time: 14 s — ABNORMAL HIGH (ref 9.6–13.1)

## 2016-04-02 NOTE — Patient Instructions (Signed)
If you are age 52 or older, your body mass index should be between 23-30. Your Body mass index is 33.45 kg/m. If this is out of the aforementioned range listed, please consider follow up with your Primary Care Provider.  If you are age 29 or younger, your body mass index should be between 19-25. Your Body mass index is 33.45 kg/m. If this is out of the aformentioned range listed, please consider follow up with your Primary Care Provider.   You have been scheduled for an endoscopy. Please follow written instructions given to you at your visit today. If you use inhalers (even only as needed), please bring them with you on the day of your procedure. Your physician has requested that you go to www.startemmi.com and enter the access code given to you at your visit today. This web site gives a general overview about your procedure. However, you should still follow specific instructions given to you by our office regarding your preparation for the procedure.  Your physician has requested that you go to the basement for the following lab work before leaving today: cbc,cmet and a pt/inr  You have been scheduled for a CT scan of the abdomen and pelvis at [facility] ([id number] (this is in the same building as [facility]).   You are scheduled on 04-05-2016 at 10:45. You should arrive 15 minutes prior to your appointment time for registration. Please follow the written instructions below on the day of your exam:  WARNING: IF YOU ARE ALLERGIC TO IODINE/X-RAY DYE, PLEASE NOTIFY RADIOLOGY IMMEDIATELY AT [phone]! YOU WILL BE GIVEN A 13 HOUR PREMEDICATION PREP.  1) Do not eat or drink anything after 6:45am (4 hours prior to your test) 2) You have been given 2 bottles of oral contrast to drink. The solution may taste               better if refrigerated, but do NOT add ice or any other liquid to this solution. Shake             well before drinking.    Drink 1 bottle of contrast @  8:45am (2 hours prior to your exam)  Drink 1 bottle of contrast @ 9:45am (1 hour prior to your exam)  You may take any medications as prescribed with a small amount of water except for the following: Metformin, Glucophage, Glucovance, Avandamet, Riomet, Fortamet, Actoplus Met, Janumet, Glumetza or Metaglip. The above medications must be held the day of the exam AND 48 hours after the exam.  The purpose of you drinking the oral contrast is to aid in the visualization of your intestinal tract. The contrast solution may cause some diarrhea. Before your exam is started, you will be given a small amount of fluid to drink. Depending on your individual set of symptoms, you may also receive an intravenous injection of x-ray contrast/dye. Plan on being at [facility] for 30 minutes or longer, depending on the type of exam you are having performed.  This test typically takes 30-45 minutes to complete.  If you have any questions regarding your exam or if you need to reschedule, you may call the CT department at [phone] between the hours of 8:00 am and 5:00 pm, Monday-Friday.  ________________________________________________________________________   Thank you for choosing [facility]  Dr Wilfrid Lund III

## 2016-04-02 NOTE — Progress Notes (Signed)
[facility] Consult Note:  History: Jesse Esparza 04/02/2016  Referring physician: Arnoldo Morale, MD  Reason for consult/chief complaint: abnormal liver test; abnormal imaging; nausea and vomiting; early satiety; Abdominal Pain (lower abd, fatigue, weekness and dizziness); and Weight Loss   Subjective  HPI:  This is a 50 year old [place] man referred by primary care for a constellation of GI symptoms. About 4 months ago he began having lower abdominal burning progressing to a chronic epigastric pain with poor appetite nausea and intermittent vomiting of liquids. He feels increasingly fatigued and short of breath with minimal exertion, and sometimes dizzy when he stands. He has also lost at least 30 pounds in the last 3 months. He was noted to have scleral icterus recent primary care visit, and his LFTs were abnormal. Imaging is described below. He has had no travel sick contacts or known history of hepatitis. He typically has 2 mixed drinks per day and has done so for many years until he stopped several months ago.  ROS:  Review of Systems  Constitutional: Positive for appetite change and unexpected weight change.  HENT: Negative for mouth sores and voice change.   Eyes: Negative for pain and redness.  Respiratory: Negative for cough and shortness of breath.   Cardiovascular: Negative for chest pain and palpitations.  Genitourinary: Negative for dysuria and hematuria.  Musculoskeletal: Negative for arthralgias and myalgias.  Skin: Negative for pallor and rash.  Neurological: Negative for weakness and headaches.  Hematological: Negative for adenopathy.    Past Medical History: Past Medical History:  Diagnosis Date  . Depression   . Elevated liver function tests   . Gallbladder sludge   . GERD (gastroesophageal reflux disease)   . Hypercholesteremia   . Hypertension   . Obesity      Past Surgical History: Past Surgical History:  Procedure Laterality Date  .  COLONOSCOPY  2016  . left testicular  childhood  . tumor on right breast  childhood     Family History: Family History  Problem Relation Age of Onset  . Hyperlipidemia Father   . Hyperlipidemia Mother   . Colon cancer Neg Hx   . Esophageal cancer Neg Hx   . Rectal cancer Neg Hx   . Stomach cancer Neg Hx     Social History: Social History   Social History  . Marital status: Single    Spouse name: N/A  . Number of children: N/A  . Years of education: N/A   Social History Main Topics  . Smoking status: Never Smoker  . Smokeless tobacco: Never Used  . Alcohol use 1.2 - 1.8 oz/week    2 - 3 Shots of liquor per week     Comment: pt reports not using any alcohol at this time  . Drug use: No  . Sexual activity: Not Asked   Other Topics Concern  . None   Social History Narrative  . None    Allergies: No Known Allergies  Outpatient Meds: Current Outpatient Prescriptions  Medication Sig Dispense Refill  . esomeprazole (NEXIUM) 40 MG capsule Take 1 capsule (40 mg total) by mouth daily. 30 capsule 3   No current facility-administered medications for this visit.    He was previously on amlodipine but no longer needed it, although it took a few doses of duloxetine a few months ago, and has rare use of Naprosyn.  ___________________________________________________________________ Objective   Exam:  BP 100/64 (BP Location: Left Arm, Patient Position: Sitting, Cuff Size: Normal)   Pulse 88  Ht 5' 6.14" (1.68 m) Comment: height measured without shoes  Wt 208 lb 2 oz (94.4 kg)   BMI 33.45 kg/m    General: this is a(n) obese middle-aged Latino man in no acute distress.   Eyes: sclera icteric, no redness  ENT: oral mucosa moist without lesions, no cervical or supraclavicular lymphadenopathy, good dentition  CV: RRR without murmur, S1/S2, no JVD, no peripheral edema  Resp: clear to auscultation bilaterally, normal RR and effort noted  GI: soft, positive  epigastric and right upper quadrant tenderness, with active bowel sounds. Both left and right lobes of liver are enlarged, palpable 2-3 fingerbreadths below the costal margin on inspiration, and it is tender.  Skin; warm and dry, no rash, he does have light jaundice  Neuro: awake, alert and oriented x 3. Normal gross motor function and fluent speech  Labs:  CMP Latest Ref Rng & Units 04/02/2016 03/19/2016 01/28/2015  Glucose 70 - 99 mg/dL 84 73 135(H)  BUN 6 - 23 mg/dL 12 8 11   Creatinine 0.40 - 1.50 mg/dL 0.79 0.73 0.93  Sodium 135 - 145 mEq/L 137 138 137  Potassium 3.5 - 5.1 mEq/L 4.9 4.5 4.1  Chloride 96 - 112 mEq/L 103 101 98  CO2 19 - 32 mEq/L 27 26 27   Calcium 8.4 - 10.5 mg/dL 8.9 7.9(L) 9.2  Total Protein 6.0 - 8.3 g/dL 7.5 6.2 7.7  Total Bilirubin 0.2 - 1.2 mg/dL 4.0(H) 7.5(H) 0.8  Alkaline Phos 39 - 117 U/L 116 143(H) 76  AST 0 - 37 U/L 124(H) 158(H) 18  ALT 0 - 53 U/L 62(H) 77(H) 18   CBC    Component Value Date/Time   WBC 12.3 (H) 04/02/2016 0932   RBC 3.47 (L) 04/02/2016 0932   HGB 12.5 (L) 04/02/2016 0932   HCT 36.3 (L) 04/02/2016 0932   PLT 157.0 04/02/2016 0932   MCV 104.7 (H) 04/02/2016 0932   MCH 29.7 01/21/2015 0830   MCHC 34.4 04/02/2016 0932   RDW 15.5 04/02/2016 0932   LYMPHSABS 0.9 04/02/2016 0932   MONOABS 0.8 04/02/2016 0932   EOSABS 0.0 04/02/2016 0932   BASOSABS 0.0 04/02/2016 0932   Platelets as low as 122K in 2013 No INR  Radiologic Studies:  [facility] /pelvic CT 12/05/15 - fatty liver, no ascites [place] 03/30/16:  GB sludge, no stones, CBD 68mm, fatty liver Neg Acute hep panel 03/19/16 Colonoscopy 2016 [facility])  - diminutive polyp, good prep Scrotal US:  April - normal  Assessment: Encounter Diagnoses  Name Primary?  . Jaundice Yes  . Weight loss   . Elevated liver function tests   . Early satiety   . Non-intractable vomiting with nausea, vomiting of unspecified type   . Gastroesophageal reflux disease, esophagitis presence not specified   .  Anorexia   I am concerned that he has developed hepatic cirrhosis, or possibly an upper GI malignancy that is metastatic to the liver. It may not a been evident on a noncontrast CT scan. The diffuse heterogeneous echotexture of the liver may just be fatty liver, but could be diffuse infiltrating malignancy. His substantial weight loss is very worrisome.  Plan:  CT scan abdomen and pelvis with oral and IV contrast early next week Upper endoscopy next week  The benefits and risks of the planned procedure were described in detail with the patient or (when appropriate) their health care proxy.  Risks were outlined as including, but not limited to, bleeding, infection, perforation, adverse medication reaction leading to cardiac or  pulmonary decompensation, or pancreatitis (if ERCP).  The limitation of incomplete mucosal visualization was also discussed.  No guarantees or warranties were given.   Thank you for the courtesy of this consult.  Please call me with any questions or concerns.  Nelida Meuse III  CC: Arnoldo Morale, MD

## 2016-04-05 ENCOUNTER — Ambulatory Visit (INDEPENDENT_AMBULATORY_CARE_PROVIDER_SITE_OTHER)
Admission: RE | Admit: 2016-04-05 | Discharge: 2016-04-05 | Disposition: A | Discharge status: Home / Self Care | Payer: Self-pay | Source: Ambulatory Visit | Attending: Gastroenterology | Admitting: Gastroenterology

## 2016-04-05 ENCOUNTER — Encounter: Payer: Self-pay | Admitting: Gastroenterology

## 2016-04-05 DIAGNOSIS — R17 Unspecified jaundice: Secondary | ICD-10-CM

## 2016-04-05 DIAGNOSIS — R634 Abnormal weight loss: Secondary | ICD-10-CM

## 2016-04-05 DIAGNOSIS — R7989 Other specified abnormal findings of blood chemistry: Secondary | ICD-10-CM

## 2016-04-05 DIAGNOSIS — R945 Abnormal results of liver function studies: Secondary | ICD-10-CM

## 2016-04-05 MED ORDER — IOPAMIDOL (ISOVUE-300) INJECTION 61%
100.0000 mL | Freq: Once | INTRAVENOUS | Status: AC | PRN
  Administered 2016-04-05: 100 mL via INTRAVENOUS

## 2016-04-07 ENCOUNTER — Encounter: Payer: Self-pay | Admitting: Gastroenterology

## 2016-04-07 ENCOUNTER — Ambulatory Visit (AMBULATORY_SURGERY_CENTER): Payer: Self-pay | Admitting: Gastroenterology

## 2016-04-07 VITALS — BP 117/75 | HR 69 | Temp 97.1°F | Resp 15 | Ht 66.0 in | Wt 208.0 lb

## 2016-04-07 DIAGNOSIS — K319 Disease of stomach and duodenum, unspecified: Secondary | ICD-10-CM

## 2016-04-07 DIAGNOSIS — R634 Abnormal weight loss: Secondary | ICD-10-CM

## 2016-04-07 MED ORDER — SODIUM CHLORIDE 0.9 % IV SOLN: 500.0000 mL | INTRAVENOUS | Status: DC

## 2016-04-07 NOTE — Op Note (Signed)
[place] Patient Name: Jesse Esparza Procedure Date: 04/07/2016 8:39 AM MRN: [id number] Endoscopist: Mallie Mussel L. Loletha Carrow , MD Age: 50 Referring MD:  Date of Birth: [birth year] Gender: Male Account #: [id number] Procedure:                Upper GI endoscopy Indications:              Epigastric abdominal pain, Early satiety, Nausea,                            Weight loss Medicines:                Monitored Anesthesia Care Procedure:                Pre-Anesthesia Assessment:                           - Prior to the procedure, a History and Physical                            was performed, and patient medications and                            allergies were reviewed. The patient's tolerance of                            previous anesthesia was also reviewed. The risks                            and benefits of the procedure and the sedation                            options and risks were discussed with the patient.                            All questions were answered, and informed consent                            was obtained. Prior Anticoagulants: The patient has                            taken no previous anticoagulant or antiplatelet                            agents. ASA Grade Assessment: III - A patient with                            severe systemic disease. After reviewing the risks                            and benefits, the patient was deemed in                            satisfactory condition to undergo the procedure.  After obtaining informed consent, the endoscope was                            passed under direct vision. Throughout the                            procedure, the patient's blood pressure, pulse, and                            oxygen saturations were monitored continuously. The                            Model GIF-HQ190 [id number]) scope was introduced                            through the mouth, and advanced to the  second part                            of duodenum. The upper GI endoscopy was                            accomplished without difficulty. The patient                            tolerated the procedure well. Scope In: Scope Out: Findings:                 The esophagus was normal.                           Diffuse mild inflammation characterized by                            granularity was found in the entire examined                            stomach. Biopsies were taken from the antrum and                            body with a cold forceps for histology.                           The cardia and gastric fundus were normal on                            retroflexion.                           The examined duodenum was normal. Complications:            No immediate complications. Estimated Blood Loss:     Estimated blood loss: none. Impression:               - Normal esophagus.                           - Chronic gastritis. Biopsied.                           -  Normal examined duodenum.                           - Cause of symptoms unclear. Unrevealing CTAP 2                            days ago.                           Await pathology results, continune alcohol                            abstinence. Recommendation:           - Patient has a contact number available for                            emergencies. The signs and symptoms of potential                            delayed complications were discussed with the                            patient. Return to normal activities tomorrow.                            Written discharge instructions were provided to the                            patient.                           - Resume previous diet.                           - Continue present medications.                           - Await pathology results. Leiyah Maultsby L. Loletha Carrow, MD 04/07/2016 9:04:25 AM This report has been signed electronically.

## 2016-04-07 NOTE — Progress Notes (Signed)
Called to room to assist during endoscopic procedure.  Patient ID and intended procedure confirmed with present staff. Received instructions for my participation in the procedure from the performing physician.  

## 2016-04-07 NOTE — Progress Notes (Signed)
Patient awakening,vss,report to rn 

## 2016-04-07 NOTE — Patient Instructions (Signed)

## 2016-04-08 ENCOUNTER — Telehealth: Payer: Self-pay

## 2016-04-08 NOTE — Telephone Encounter (Signed)
  Follow up Call-  Call back number 04/07/2016 10/14/2014  Post procedure Call Back phone  # [phone] phone [phone]  Permission to leave phone message Yes No  Some recent data might be hidden     Patient questions:  Do you have a fever, pain , or abdominal swelling? No. Pain Score  0 *  Have you tolerated food without any problems? Yes.    Have you been able to return to your normal activities? Yes.    Do you have any questions about your discharge instructions: Diet   No. Medications  No. Follow up visit  No.  Do you have questions or concerns about your Care? No.  Actions: * If pain score is 4 or above: No action needed, pain <4.

## 2016-04-16 ENCOUNTER — Ambulatory Visit: Payer: Self-pay | Attending: Family Medicine | Admitting: Family Medicine

## 2016-04-16 ENCOUNTER — Encounter: Payer: Self-pay | Admitting: Family Medicine

## 2016-04-16 VITALS — BP 84/55 | HR 96 | Temp 98.1°F | Ht 66.0 in | Wt 205.4 lb

## 2016-04-16 DIAGNOSIS — E278 Other specified disorders of adrenal gland: Secondary | ICD-10-CM | POA: Insufficient documentation

## 2016-04-16 DIAGNOSIS — D3501 Benign neoplasm of right adrenal gland: Secondary | ICD-10-CM

## 2016-04-16 DIAGNOSIS — R17 Unspecified jaundice: Secondary | ICD-10-CM

## 2016-04-16 DIAGNOSIS — I9589 Other hypotension: Secondary | ICD-10-CM

## 2016-04-16 DIAGNOSIS — B9681 Helicobacter pylori [H. pylori] as the cause of diseases classified elsewhere: Secondary | ICD-10-CM

## 2016-04-16 DIAGNOSIS — Z9889 Other specified postprocedural states: Secondary | ICD-10-CM | POA: Insufficient documentation

## 2016-04-16 DIAGNOSIS — A048 Other specified bacterial intestinal infections: Secondary | ICD-10-CM | POA: Insufficient documentation

## 2016-04-16 DIAGNOSIS — Z79899 Other long term (current) drug therapy: Secondary | ICD-10-CM | POA: Insufficient documentation

## 2016-04-16 DIAGNOSIS — E279 Disorder of adrenal gland, unspecified: Secondary | ICD-10-CM

## 2016-04-16 DIAGNOSIS — K219 Gastro-esophageal reflux disease without esophagitis: Secondary | ICD-10-CM | POA: Insufficient documentation

## 2016-04-16 NOTE — Patient Instructions (Signed)

## 2016-04-16 NOTE — Progress Notes (Signed)
bx results- H pylori Dizziness Weight loss

## 2016-04-16 NOTE — Progress Notes (Signed)
Subjective:  [name], male    DOB: 1965/11/29  Age: 50 y.o. MRN: [id number]  CC: GI Problem (endoscopy, bx- h pylori); Depression; Weight Loss; and Dizziness   HPI Jesse Esparza is a 50 year old male with a history of GERD currently being evaluated for elevated liver enzymes, hyperbilirubinemia and jaundice. He is status post upper endoscopy by GI with findings of H. pylori gastritis and he is currently on antibiotic regimen for this. CT abdomen and pelvis with contrast revealed hepatic steatosis, poorly distended gallbladder containing sludge, mild colonic diverticulosis, 1.1 x 0.9 cm inferior right adrenal mass which could represent a small primary adrenal neoplasm.  He informs me that he is early satiety, abdominal bloating and dizziness were thought to be from his H. pylori infection as per his GI. He also endorses a 35 pound weight loss in 2 months however he informs me he is trying to lose weight and is watching what he eats. He does continue to feel dizzy on getting up from a sitting position but denies chest pains or shortness of breath. Review of his labs reveal bilirubin levels have trended down from 7.5-4.0 in 2 weeks  Past Medical History:  Diagnosis Date  . Depression   . Elevated liver function tests   . Gallbladder sludge   . GERD (gastroesophageal reflux disease)   . Hypercholesteremia   . Hypertension   . Obesity     Past Surgical History:  Procedure Laterality Date  . COLONOSCOPY  2016  . left testicular  childhood  . tumor on right breast  childhood    No Known Allergies    Outpatient Medications Prior to Visit  Medication Sig Dispense Refill  . esomeprazole (NEXIUM) 40 MG capsule Take 1 capsule (40 mg total) by mouth daily. (Patient not taking: Reported on 04/16/2016) 30 capsule 3   Facility-Administered Medications Prior to Visit  Medication Dose Route Frequency Provider Last Rate Last Dose  . 0.9 %  sodium chloride infusion  500  mL Intravenous Continuous Nelida Meuse III, MD        ROS Review of Systems  Constitutional: Positive for appetite change and unexpected weight change. Negative for activity change.  HENT: Negative for sinus pressure and sore throat.   Eyes: Negative for visual disturbance.  Respiratory: Negative for cough, chest tightness and shortness of breath.   Cardiovascular: Negative for chest pain and leg swelling.  Gastrointestinal: Negative for abdominal distention, abdominal pain, constipation and diarrhea.  Endocrine: Negative.   Genitourinary: Negative for dysuria.  Musculoskeletal: Negative for joint swelling and myalgias.  Skin: Negative for rash.  Allergic/Immunologic: Negative.   Neurological: Positive for light-headedness. Negative for weakness and numbness.  Psychiatric/Behavioral: Negative for dysphoric mood and suicidal ideas.    Objective:  BP (!) 84/55 (BP Location: Right Arm, Patient Position: Sitting, Cuff Size: Large)   Pulse 96   Temp 98.1 F (36.7 C) (Oral)   Ht 5\' 6"  (1.676 m)   Wt 205 lb 6.4 oz (93.2 kg)   SpO2 98%   BMI 33.15 kg/m   BP/Weight 04/16/2016 [id number]  Systolic BP 84 123XX123 123XX123  Diastolic BP 55 75 64  Wt. (Lbs) 205.4 208 208.13  BMI 33.15 33.57 33.45      Physical Exam  Constitutional: He is oriented to person, place, and time. He appears well-developed and well-nourished.  Eyes: Scleral icterus (mild; improved icterus compared to last visit) is present.  Cardiovascular: Normal rate, normal heart sounds and intact distal pulses.  No murmur heard. Pulmonary/Chest: Effort normal and breath sounds normal. He has no wheezes. He has no rales. He exhibits no tenderness.  Abdominal: Soft. Bowel sounds are normal. He exhibits no distension and no mass. There is no tenderness.  Musculoskeletal: Normal range of motion.  Neurological: He is alert and oriented to person, place, and time.    CMP Latest Ref Rng & Units 04/02/2016 03/19/2016 01/28/2015    Glucose 70 - 99 mg/dL 84 73 135(H)  BUN 6 - 23 mg/dL 12 8 11   Creatinine 0.40 - 1.50 mg/dL 0.79 0.73 0.93  Sodium 135 - 145 mEq/L 137 138 137  Potassium 3.5 - 5.1 mEq/L 4.9 4.5 4.1  Chloride 96 - 112 mEq/L 103 101 98  CO2 19 - 32 mEq/L 27 26 27   Calcium 8.4 - 10.5 mg/dL 8.9 7.9(L) 9.2  Total Protein 6.0 - 8.3 g/dL 7.5 6.2 7.7  Total Bilirubin 0.2 - 1.2 mg/dL 4.0(H) 7.5(H) 0.8  Alkaline Phos 39 - 117 U/L 116 143(H) 76  AST 0 - 37 U/L 124(H) 158(H) 18  ALT 0 - 53 U/L 62(H) 77(H) 18   CLINICAL DATA:  Severe postprandial abdominal pain and nausea for the past 4-5 months. Intermittent lower abdominal pain and burning in the mornings. Jaundice. Elevated liver function tests.  EXAM: CT ABDOMEN AND PELVIS WITH CONTRAST  TECHNIQUE: Multidetector CT imaging of the abdomen and pelvis was performed using the standard protocol following bolus administration of intravenous contrast.  CONTRAST:  140mL ISOVUE-300 IOPAMIDOL (ISOVUE-300) INJECTION 61%  COMPARISON:  Abdomen and pelvis CT dated 12/05/2015, abdomen ultrasound dated 03/30/2016. Previous abdomen and pelvis CT examinations.  FINDINGS: Lower chest:  Clear lung bases.  Hepatobiliary: Diffuse low density of the liver relative to the spleen. Poorly distended gallbladder containing sludge.  Pancreas: No mass, inflammatory changes, or other significant abnormality.  Spleen: Within normal limits in size and appearance.  Adrenals/Urinary Tract: Interval visualization of a small, oval high density mass in the inferior aspect of the right adrenal gland, measuring 1.1 x 0.9 cm on image number 36 of series 2. This is also seen on coronal image number 61 and sagittal image number 50. Normal appearing kidneys, ureters and urinary bladder.  Stomach/Bowel: Small number of colonic diverticula. No gastric or small bowel abnormalities. Normal appearing appendix.  Vascular/Lymphatic: Mild atheromatous arterial  calcifications, including the abdominal aorta. No enlarged lymph nodes.  Reproductive: No mass or other significant abnormality.  Other: Very small umbilical hernia containing fat.  Musculoskeletal: Lumbar and lower thoracic spine degenerative changes.  IMPRESSION: 1. No acute abnormality. 2. Diffuse hepatic steatosis with mild improvement. 3. Interval visualization of a 1.1 x 0.9 cm inferior right adrenal mass. This is high density, most likely representing enhancement. This could represent a small primary adrenal neoplasm. This is not a typical appearance for an adenoma. A metastasis is unlikely in the absence of known malignancy. 4. Poorly distended gallbladder containing sludge. 5. Mild colonic diverticulosis.   Electronically Signed   By: Claudie Revering M.D.   On: 04/05/2016 12:02  Assessment & Plan:   1. Other specified hypotension Hypotension could explain dizziness We'll place on midodrine if still elevated at his next visit - Cortisol-am, blood - ACTH  2. Hyperbilirubinemia Trended down from 7.5 to 4 recently  3. Helicobacter pylori (H. pylori) infection Currently on antibiotic regimen  4. Jaundice Improving  5. Adrenal adenoma, right Incidental finding most likely benign given size but we'll monitor - Cortisol-am, blood - ACTH   No orders  of the defined types were placed in this encounter.   Follow-up: Return in about 2 weeks (around 04/30/2016) for follow up on Hypotension.   Arnoldo Morale MD

## 2016-04-19 ENCOUNTER — Ambulatory Visit: Payer: Self-pay | Attending: Family Medicine

## 2016-04-19 DIAGNOSIS — D3501 Benign neoplasm of right adrenal gland: Secondary | ICD-10-CM | POA: Insufficient documentation

## 2016-04-19 DIAGNOSIS — I9589 Other hypotension: Secondary | ICD-10-CM | POA: Insufficient documentation

## 2016-04-19 NOTE — Progress Notes (Signed)
Pt is here for lab work only. 

## 2016-04-20 LAB — CORTISOL-AM, BLOOD: CORTISOL - AM: 15.3 ug/dL

## 2016-04-23 LAB — ACTH: C206 ACTH: 11 pg/mL (ref 6–50)

## 2016-04-28 ENCOUNTER — Telehealth: Payer: Self-pay

## 2016-04-28 NOTE — Telephone Encounter (Signed)
Call to [facility] interpreters [phone], interpreter "Clifton James" ID [phone] left message requested return call.  When patient calls please advise:labs are normal

## 2016-04-30 ENCOUNTER — Telehealth: Payer: Self-pay

## 2016-04-30 NOTE — Telephone Encounter (Signed)
Writer called patient per Dr. Jarold Song to let patient know that his blood work came back normal.  Patient stated understanding and verbalized that he saw it on My Chart and was very happy with the results.

## 2016-04-30 NOTE — Telephone Encounter (Signed)
-----   Message from Arnoldo Morale, MD sent at 04/23/2016  4:52 PM EDT ----- Please inform the patient that labs are normal. Thank you.

## 2016-05-04 ENCOUNTER — Ambulatory Visit: Payer: Self-pay | Attending: Family Medicine | Admitting: Family Medicine

## 2016-05-04 ENCOUNTER — Encounter: Payer: Self-pay | Admitting: Family Medicine

## 2016-05-04 DIAGNOSIS — F329 Major depressive disorder, single episode, unspecified: Secondary | ICD-10-CM | POA: Insufficient documentation

## 2016-05-04 DIAGNOSIS — Z6833 Body mass index (BMI) 33.0-33.9, adult: Secondary | ICD-10-CM | POA: Insufficient documentation

## 2016-05-04 DIAGNOSIS — I10 Essential (primary) hypertension: Secondary | ICD-10-CM | POA: Insufficient documentation

## 2016-05-04 DIAGNOSIS — M4806 Spinal stenosis, lumbar region: Secondary | ICD-10-CM | POA: Insufficient documentation

## 2016-05-04 DIAGNOSIS — R7989 Other specified abnormal findings of blood chemistry: Secondary | ICD-10-CM

## 2016-05-04 DIAGNOSIS — K219 Gastro-esophageal reflux disease without esophagitis: Secondary | ICD-10-CM | POA: Insufficient documentation

## 2016-05-04 DIAGNOSIS — E669 Obesity, unspecified: Secondary | ICD-10-CM | POA: Insufficient documentation

## 2016-05-04 DIAGNOSIS — R945 Abnormal results of liver function studies: Secondary | ICD-10-CM

## 2016-05-04 DIAGNOSIS — M48061 Spinal stenosis, lumbar region without neurogenic claudication: Secondary | ICD-10-CM | POA: Insufficient documentation

## 2016-05-04 DIAGNOSIS — R17 Unspecified jaundice: Secondary | ICD-10-CM | POA: Insufficient documentation

## 2016-05-04 NOTE — Progress Notes (Signed)
Subjective:  [name], male    DOB: [birth year]  Age: 50 y.o. MRN: [id number]  CC: Hypotension and Gastroesophageal Reflux   HPI Chasin Donsbach is a 50 year old male with a history of GERD, elevated LFTs, hyperbilirubinemia and jaundice recently treated for H. pylori infection and has completed a course of antibiotics. He reports feeling well with resolution of dizziness, abdominal bloating. He is currently working on reducing his portion sizes and eating healthy in an attempt to loose weight  He does have low back pain which radiates down both extremities and MRI from 08/2015 revealed spinal stenosis and L3-L4, L4-L5 with some disc bulge in L4-L5. He has Flexeril and naproxen at home but states the medications make him sleepy; previously seen by an orthopedic and tells me he was informed he had arthritis. 1-6 patient obtain some follow-up therapy.  Past Medical History:  Diagnosis Date  . Depression   . Elevated liver function tests   . Gallbladder sludge   . GERD (gastroesophageal reflux disease)   . Hypercholesteremia   . Hypertension   . Obesity     Past Surgical History:  Procedure Laterality Date  . COLONOSCOPY  2016  . left testicular  childhood  . tumor on right breast  childhood      Outpatient Medications Prior to Visit  Medication Sig Dispense Refill  . omeprazole (PRILOSEC) 20 MG capsule Take 20 mg by mouth 2 (two) times daily before a meal.    . esomeprazole (NEXIUM) 40 MG capsule Take 1 capsule (40 mg total) by mouth daily. (Patient not taking: Reported on 04/16/2016) 30 capsule 3  . 0.9 %  sodium chloride infusion      No facility-administered medications prior to visit.     ROS Review of Systems  Constitutional: Negative for activity change and appetite change.  HENT: Negative for sinus pressure and sore throat.   Eyes: Negative for visual disturbance.  Respiratory: Negative for cough, chest tightness and shortness of breath.     Cardiovascular: Negative for chest pain and leg swelling.  Gastrointestinal: Negative for abdominal distention, abdominal pain, constipation and diarrhea.  Endocrine: Negative.   Genitourinary: Negative for dysuria.  Musculoskeletal:       See hpi  Skin: Negative for rash.  Allergic/Immunologic: Negative.   Neurological: Negative for weakness, light-headedness and numbness.  Psychiatric/Behavioral: Negative for dysphoric mood and suicidal ideas.    Objective:  BP 108/66 (BP Location: Right Arm, Patient Position: Sitting, Cuff Size: Small)   Pulse 76   Temp 99.2 F (37.3 C) (Oral)   Ht 5\' 6"  (1.676 m)   Wt 205 lb (93 kg)   SpO2 96%   BMI 33.09 kg/m   BP/Weight 05/04/2016 [id number]  Systolic BP 123XX123 84 123XX123  Diastolic BP 66 55 75  Wt. (Lbs) 205 205.4 208  BMI 33.09 33.15 33.57      Physical Exam  Constitutional: He is oriented to person, place, and time. He appears well-developed and well-nourished.  Cardiovascular: Normal rate, normal heart sounds and intact distal pulses.   No murmur heard. Pulmonary/Chest: Effort normal and breath sounds normal. He has no wheezes. He has no rales. He exhibits no tenderness.  Abdominal: Soft. Bowel sounds are normal. He exhibits no distension and no mass. There is no tenderness.  Musculoskeletal: Normal range of motion. He exhibits tenderness (Lumbar tenderness; positive straight leg raise on the right).  Neurological: He is alert and oriented to person, place, and time.  Assessment & Plan:   1. Spinal stenosis of lumbar region We'll try conservative management for now Patient advised to resume naproxen and Flexeril Use back brace His symptoms persist we will consider some form of PT as he has no medical coverage at this time and I have explained to him that this supposedly to challenge with regards to therapy  2. Hyperbilirubinemia Jaundice is improving  3. Abnormal LFTs We'll send off a metabolic panel to  evaluate Keep follow-up visit with GI. - COMPLETE METABOLIC PANEL WITH GFR   No orders of the defined types were placed in this encounter.   Follow-up: Return in about 3 months (around 08/03/2016) for Follow-up on spinal stenosis.   Arnoldo Morale MD

## 2016-05-05 LAB — COMPLETE METABOLIC PANEL WITH GFR
ALBUMIN: 3.7 g/dL (ref 3.6–5.1)
ALK PHOS: 82 U/L (ref 40–115)
ALT: 33 U/L (ref 9–46)
AST: 55 U/L — AB (ref 10–35)
BILIRUBIN TOTAL: 1 mg/dL (ref 0.2–1.2)
BUN: 10 mg/dL (ref 7–25)
CALCIUM: 8.8 mg/dL (ref 8.6–10.3)
CO2: 26 mmol/L (ref 20–31)
CREATININE: 0.79 mg/dL (ref 0.70–1.33)
Chloride: 104 mmol/L (ref 98–110)
GFR, Est African American: >89 mL/min (ref >=60)
GFR, Est Non African American: >89 mL/min (ref >=60)
GLUCOSE: 83 mg/dL (ref 65–99)
Potassium: 4.3 mmol/L (ref 3.5–5.3)
SODIUM: 139 mmol/L (ref 135–146)
TOTAL PROTEIN: 7 g/dL (ref 6.1–8.1)

## 2016-05-07 ENCOUNTER — Telehealth: Payer: Self-pay

## 2016-05-07 NOTE — Telephone Encounter (Signed)
Pt was called on 9/8 and informed of results.

## 2016-05-28 ENCOUNTER — Other Ambulatory Visit: Payer: Self-pay

## 2016-05-28 ENCOUNTER — Encounter: Payer: Self-pay | Admitting: Gastroenterology

## 2016-05-28 ENCOUNTER — Ambulatory Visit (INDEPENDENT_AMBULATORY_CARE_PROVIDER_SITE_OTHER): Payer: Self-pay | Admitting: Gastroenterology

## 2016-05-28 VITALS — BP 100/60 | HR 70 | Ht 66.0 in | Wt 202.0 lb

## 2016-05-28 DIAGNOSIS — A048 Other specified bacterial intestinal infections: Secondary | ICD-10-CM

## 2016-05-28 DIAGNOSIS — R945 Abnormal results of liver function studies: Secondary | ICD-10-CM

## 2016-05-28 DIAGNOSIS — K219 Gastro-esophageal reflux disease without esophagitis: Secondary | ICD-10-CM

## 2016-05-28 DIAGNOSIS — R7989 Other specified abnormal findings of blood chemistry: Secondary | ICD-10-CM

## 2016-05-28 DIAGNOSIS — D3501 Benign neoplasm of right adrenal gland: Secondary | ICD-10-CM

## 2016-05-28 DIAGNOSIS — B9681 Helicobacter pylori [H. pylori] as the cause of diseases classified elsewhere: Secondary | ICD-10-CM

## 2016-05-28 NOTE — Patient Instructions (Signed)
You have been scheduled for an MRI at [facility], [place]  on 06-09-2016. Your appointment time is 10am. Please arrive 15 minutes prior to your appointment time for registration purposes. Please make certain not to have anything to eat or drink 6 hours prior to your test. In addition, if you have any metal in your body, have a pacemaker or defibrillator, please be sure to let your ordering physician know. This test typically takes 45 minutes to 1 hour to complete.  If you are age 41 or older, your body mass index should be between 23-30. Your Body mass index is 32.6 kg/m. If this is out of the aforementioned range listed, please consider follow up with your Primary Care Provider.  If you are age 1 or younger, your body mass index should be between 19-25. Your Body mass index is 32.6 kg/m. If this is out of the aformentioned range listed, please consider follow up with your Primary Care Provider.    Your physician has requested that you go to the basement for the following lab work before leaving today: H.Pylori stool study  Thank you for choosing [facility]  Dr Wilfrid Lund III

## 2016-05-28 NOTE — Progress Notes (Signed)
[facility] Progress Note  Chief Complaint: Jaundice and nausea  Subjective  History:  This is follow-up for a 50 year old man who saw me in early August for a constellation of symptoms including nausea, weight loss (unclear how much of it was purposeful), jaundice and anorexia. His CT scan of the abdomen only showed an incidental right adrenal lesion and a fatty liver. I was concerned that he may have cirrhosis, but that was not present on CT scan.  Upper endoscopy showed no varices, there was a gastritis present and H. pylori positive on biopsies. He received samples of quadruple therapy and after finishing it feels much better. He has no further upper digestive symptoms other than rare episodes of heartburn which she takes Tums or Nexium. His jaundice has resolved, and he no longer drinks alcohol. It is not clear how much she was drinking before, but it sounds it was probably too much. He was not taking any other medicines that would've caused his elevated LFTs. ROS: Cardiovascular:  no chest pain Respiratory: no dyspnea  The patient's Past Medical, Family and Social History were reviewed and are on file in the EMR.  Objective:  Med list reviewed  Vital signs in last 24 hrs: Vitals:   05/28/16 1327  BP: 100/60  Pulse: 70    Physical Exam  He looks better and healthier than the last time I saw him.  HEENT: sclera anicteric, oral mucosa moist without lesions  Neck: supple, no thyromegaly, JVD or lymphadenopathy  Cardiac: RRR without murmurs, S1S2 heard, no peripheral edema  Pulm: clear to auscultation bilaterally, normal RR and effort noted  Abdomen: soft, Obese, no tenderness, with active bowel sounds. No guarding or palpable hepatosplenomegaly.  Skin; warm and dry, no jaundice or rash  Recent Labs:  CMP Latest Ref Rng & Units 05/04/2016 04/02/2016 03/19/2016  Glucose 65 - 99 mg/dL 83 84 73  BUN 7 - 25 mg/dL 10 12 8   Creatinine 0.70 - 1.33 mg/dL 0.79 0.79 0.73  Sodium  135 - 146 mmol/L 139 137 138  Potassium 3.5 - 5.3 mmol/L 4.3 4.9 4.5  Chloride 98 - 110 mmol/L 104 103 101  CO2 20 - 31 mmol/L 26 27 26   Calcium 8.6 - 10.3 mg/dL 8.8 8.9 7.9(L)  Total Protein 6.1 - 8.1 g/dL 7.0 7.5 6.2  Total Bilirubin 0.2 - 1.2 mg/dL 1.0 4.0(H) 7.5(H)  Alkaline Phos 40 - 115 U/L 82 116 143(H)  AST 10 - 35 U/L 55(H) 124(H) 158(H)  ALT 9 - 46 U/L 33 62(H) 77(H)   Bx pos for H pylori - treated with quad therapy INR 1.3 Radiologic studies:  CT with 1cm right adrenal lesion Fatty liver  @ASSESSMENTPLANBEGIN @ Assessment: Encounter Diagnoses  Name Primary?  . H. pylori infection Yes  . Abnormal LFTs   . Adrenal adenoma, right   . Gastroesophageal reflux disease without esophagitis       Plan:  The H. pylori seems to have been the cause of his dyspepsia.  H. pylori stool antigen will be obtained to confirm eradication He can use as needed PPI for his nonerosive GERD MRI of the right adrenal gland has been ordered  He was counseled on further efforts for weight loss. He is up-to-date on colonoscopy screening from 2016.  See me as needed. Total time 30 minutes, over half spent in counseling and coordination of care.   Nelida Meuse III

## 2016-05-31 ENCOUNTER — Other Ambulatory Visit: Payer: Self-pay

## 2016-05-31 ENCOUNTER — Other Ambulatory Visit: Payer: Self-pay | Admitting: Gastroenterology

## 2016-05-31 DIAGNOSIS — R945 Abnormal results of liver function studies: Secondary | ICD-10-CM

## 2016-05-31 DIAGNOSIS — R7989 Other specified abnormal findings of blood chemistry: Secondary | ICD-10-CM

## 2016-05-31 DIAGNOSIS — D3501 Benign neoplasm of right adrenal gland: Secondary | ICD-10-CM

## 2016-05-31 DIAGNOSIS — A048 Other specified bacterial intestinal infections: Secondary | ICD-10-CM

## 2016-05-31 DIAGNOSIS — K219 Gastro-esophageal reflux disease without esophagitis: Secondary | ICD-10-CM

## 2016-06-01 LAB — HELICOBACTER PYLORI  SPECIAL ANTIGEN: H. PYLORI ANTIGEN STOOL: NOT DETECTED

## 2016-06-09 ENCOUNTER — Ambulatory Visit (HOSPITAL_COMMUNITY)
Admission: RE | Admit: 2016-06-09 | Discharge: 2016-06-09 | Disposition: A | Discharge status: Home / Self Care | Payer: Self-pay | Source: Ambulatory Visit | Attending: Gastroenterology | Admitting: Gastroenterology

## 2016-06-09 DIAGNOSIS — A048 Other specified bacterial intestinal infections: Secondary | ICD-10-CM | POA: Insufficient documentation

## 2016-06-09 DIAGNOSIS — D3501 Benign neoplasm of right adrenal gland: Secondary | ICD-10-CM | POA: Insufficient documentation

## 2016-06-09 DIAGNOSIS — R7989 Other specified abnormal findings of blood chemistry: Secondary | ICD-10-CM | POA: Insufficient documentation

## 2016-06-09 DIAGNOSIS — K219 Gastro-esophageal reflux disease without esophagitis: Secondary | ICD-10-CM | POA: Insufficient documentation

## 2016-06-09 DIAGNOSIS — R945 Abnormal results of liver function studies: Secondary | ICD-10-CM

## 2016-06-09 DIAGNOSIS — R161 Splenomegaly, not elsewhere classified: Secondary | ICD-10-CM | POA: Insufficient documentation

## 2016-06-09 DIAGNOSIS — D3500 Benign neoplasm of unspecified adrenal gland: Secondary | ICD-10-CM | POA: Insufficient documentation

## 2016-06-09 MED ORDER — GADOBENATE DIMEGLUMINE 529 MG/ML IV SOLN
20.0000 mL | Freq: Once | INTRAVENOUS | Status: AC | PRN
  Administered 2016-06-09: 20 mL via INTRAVENOUS

## 2016-06-10 ENCOUNTER — Telehealth: Payer: Self-pay | Admitting: Gastroenterology

## 2016-06-10 NOTE — Telephone Encounter (Signed)
Duplicate message. No answer on cell number. States the caller is not taking calls.

## 2016-06-11 NOTE — Telephone Encounter (Signed)
See the MRI report for details.

## 2016-06-16 ENCOUNTER — Ambulatory Visit: Payer: Self-pay | Attending: Family Medicine | Admitting: Family Medicine

## 2016-06-16 ENCOUNTER — Encounter: Payer: Self-pay | Admitting: Family Medicine

## 2016-06-16 VITALS — BP 107/70 | HR 72 | Temp 98.8°F | Ht 65.0 in | Wt 205.0 lb

## 2016-06-16 DIAGNOSIS — K009 Disorder of tooth development, unspecified: Secondary | ICD-10-CM

## 2016-06-16 DIAGNOSIS — I10 Essential (primary) hypertension: Secondary | ICD-10-CM | POA: Insufficient documentation

## 2016-06-16 DIAGNOSIS — E669 Obesity, unspecified: Secondary | ICD-10-CM | POA: Insufficient documentation

## 2016-06-16 DIAGNOSIS — K219 Gastro-esophageal reflux disease without esophagitis: Secondary | ICD-10-CM | POA: Insufficient documentation

## 2016-06-16 DIAGNOSIS — Z13228 Encounter for screening for other metabolic disorders: Secondary | ICD-10-CM

## 2016-06-16 DIAGNOSIS — E7800 Pure hypercholesterolemia, unspecified: Secondary | ICD-10-CM | POA: Insufficient documentation

## 2016-06-16 DIAGNOSIS — R200 Anesthesia of skin: Secondary | ICD-10-CM | POA: Insufficient documentation

## 2016-06-16 DIAGNOSIS — F329 Major depressive disorder, single episode, unspecified: Secondary | ICD-10-CM | POA: Insufficient documentation

## 2016-06-16 DIAGNOSIS — E278 Other specified disorders of adrenal gland: Secondary | ICD-10-CM | POA: Insufficient documentation

## 2016-06-16 DIAGNOSIS — M48062 Spinal stenosis, lumbar region with neurogenic claudication: Secondary | ICD-10-CM

## 2016-06-16 DIAGNOSIS — E279 Disorder of adrenal gland, unspecified: Secondary | ICD-10-CM

## 2016-06-16 DIAGNOSIS — Z6834 Body mass index (BMI) 34.0-34.9, adult: Secondary | ICD-10-CM | POA: Insufficient documentation

## 2016-06-16 DIAGNOSIS — K089 Disorder of teeth and supporting structures, unspecified: Secondary | ICD-10-CM | POA: Insufficient documentation

## 2016-06-16 MED ORDER — NAPROXEN 500 MG PO TABS: 500.0000 mg | ORAL_TABLET | Freq: Two times a day (BID) | ORAL | 1 refills | Status: DC

## 2016-06-16 MED ORDER — CYCLOBENZAPRINE HCL 10 MG PO TABS: 10.0000 mg | ORAL_TABLET | Freq: Two times a day (BID) | ORAL | 1 refills | Status: DC | PRN

## 2016-06-16 NOTE — Progress Notes (Signed)
Subjective:  [name], male    DOB: 1966-07-15  Age: 50 y.o. MRN: [id number]  CC: Hypertension; Depression; Gastroesophageal Reflux; and scan results (here to talk about findings)   HPI Jesse Esparza presents with complaints or worsening back pain in lumbosacral region radiating down bilateral lower extremities. Denies loss of sphincteric function, falls or weakness in legs but endorses associated numbness in legs.  MRI from 08/2015 revealed spinal stenosis in L3-L4, L4-L5 with some disc bulge in L4-L5.He is requesting Ortho referral. Would also like to be referred to a dentist because he recently noticed dark brownish discoloration of his lower incisors.  He had an MRI of his abdomen which revealed incidental finding of a right adrenal nodule (1 x 0.9 cm) stable compared to CT scan from 2 months prior suspicious for pheochromocytoma. He denies headaches or blurry vision or abdominal pain.  No outpatient prescriptions prior to visit.   No facility-administered medications prior to visit.    Past Medical History:  Diagnosis Date  . Depression   . Elevated liver function tests   . Gallbladder sludge   . GERD (gastroesophageal reflux disease)   . Hypercholesteremia   . Hypertension   . Obesity     Past Surgical History:  Procedure Laterality Date  . COLONOSCOPY  2016  . left testicular  childhood  . tumor on right breast  childhood     ROS Review of Systems  Constitutional: Negative for activity change and appetite change.  HENT: Positive for dental problem. Negative for sinus pressure and sore throat.   Respiratory: Negative for chest tightness, shortness of breath and wheezing.   Cardiovascular: Negative for chest pain and palpitations.  Gastrointestinal: Negative for abdominal distention, abdominal pain and constipation.  Genitourinary: Negative.   Musculoskeletal: Positive for back pain.  Neurological: Positive for numbness.    Psychiatric/Behavioral: Negative for behavioral problems and dysphoric mood.    Objective:  BP 107/70 (BP Location: Right Arm, Patient Position: Sitting, Cuff Size: Large)   Pulse 72   Temp 98.8 F (37.1 C) (Oral)   Ht 5\' 5"  (1.651 m)   Wt 205 lb (93 kg)   SpO2 98%   BMI 34.11 kg/m   BP/Weight 06/16/2016 [id number]  Systolic BP [id number]  Diastolic BP 70 60 66  Wt. (Lbs) 205 202 205  BMI 34.11 32.6 33.09      Physical Exam  Constitutional: He is oriented to person, place, and time. He appears well-developed and well-nourished.  Cardiovascular: Normal rate, normal heart sounds and intact distal pulses.   No murmur heard. Pulmonary/Chest: Effort normal and breath sounds normal. He has no wheezes. He has no rales. He exhibits no tenderness.  Abdominal: Soft. Bowel sounds are normal. He exhibits no distension and no mass. There is no tenderness.  Musculoskeletal: He exhibits tenderness (lumbar spine tenderness response m left-to-right; associated positive straight leg raise bilaterally).  Neurological: He is alert and oriented to person, place, and time.     Assessment & Plan:   1. Spinal stenosis of lumbar region with neurogenic claudication Advised to continue with back exercises - Ambulatory referral to Spine Surgery - naproxen (NAPROSYN) 500 MG tablet; Take 1 tablet (500 mg total) by mouth 2 (two) times daily with a meal.  Dispense: 30 tablet; Refill: 1 - cyclobenzaprine (FLEXERIL) 10 MG tablet; Take 1 tablet (10 mg total) by mouth 2 (two) times daily as needed for muscle spasms.  Dispense: 30 tablet; Refill: 1  2. Adrenal  nodule ([place]) He does not have symptoms suggestive of pheochromocytoma Repeat CT abdomen in 3 months  3. Dental anomaly - Ambulatory referral to Dentistry  4. Screening for metabolic disorder - Lipid panel; Future - HIV antibody; Future   Meds ordered this encounter  Medications  . naproxen (NAPROSYN) 500 MG tablet    Sig: Take 1  tablet (500 mg total) by mouth 2 (two) times daily with a meal.    Dispense:  30 tablet    Refill:  1  . cyclobenzaprine (FLEXERIL) 10 MG tablet    Sig: Take 1 tablet (10 mg total) by mouth 2 (two) times daily as needed for muscle spasms.    Dispense:  30 tablet    Refill:  1   This note has been created with Surveyor, quantity. Any transcriptional errors are unintentional.    Follow-up: Return in about 3 months (around 09/16/2016) for Follow-up on adrenal nodule.   Arnoldo Morale MD

## 2016-06-16 NOTE — Progress Notes (Signed)
Referral for ortho for back and dentist for dental issues

## 2016-06-16 NOTE — Patient Instructions (Signed)

## 2016-06-17 ENCOUNTER — Ambulatory Visit: Payer: Self-pay | Attending: Family Medicine

## 2016-06-17 DIAGNOSIS — Z13228 Encounter for screening for other metabolic disorders: Secondary | ICD-10-CM | POA: Insufficient documentation

## 2016-06-17 LAB — LIPID PANEL
Cholesterol: 202 mg/dL — ABNORMAL HIGH (ref 125–200)
HDL: 55 mg/dL (ref >=40)
LDL CALC: 118 mg/dL (ref <130)
Total CHOL/HDL Ratio: 3.7 Ratio (ref <=5.0)
Triglycerides: 147 mg/dL (ref <150)
VLDL: 29 mg/dL (ref <30)

## 2016-06-17 NOTE — Progress Notes (Signed)
Patient here for lab visit only 

## 2016-06-18 LAB — HIV ANTIBODY (ROUTINE TESTING W REFLEX): HIV: NONREACTIVE

## 2016-11-11 ENCOUNTER — Encounter: Payer: Self-pay | Admitting: Gastroenterology

## 2016-11-30 ENCOUNTER — Emergency Department (HOSPITAL_COMMUNITY): Payer: Self-pay

## 2016-11-30 ENCOUNTER — Emergency Department (HOSPITAL_COMMUNITY)
Admission: EM | Admit: 2016-11-30 | Discharge: 2016-11-30 | Disposition: A | Discharge status: Home / Self Care | Payer: Self-pay | Attending: Emergency Medicine | Admitting: Emergency Medicine

## 2016-11-30 ENCOUNTER — Encounter (HOSPITAL_COMMUNITY): Payer: Self-pay

## 2016-11-30 DIAGNOSIS — R519 Headache, unspecified: Secondary | ICD-10-CM

## 2016-11-30 DIAGNOSIS — I10 Essential (primary) hypertension: Secondary | ICD-10-CM | POA: Insufficient documentation

## 2016-11-30 DIAGNOSIS — R51 Headache: Secondary | ICD-10-CM | POA: Insufficient documentation

## 2016-11-30 LAB — CBC
HCT: 43 % (ref 39.0–52.0)
Hemoglobin: 14.7 g/dL (ref 13.0–17.0)
MCH: 30.2 pg (ref 26.0–34.0)
MCHC: 34.2 g/dL (ref 30.0–36.0)
MCV: 88.3 fL (ref 78.0–100.0)
PLATELETS: 125 10*3/uL — AB (ref 150–400)
RBC: 4.87 MIL/uL (ref 4.22–5.81)
RDW: 15 % (ref 11.5–15.5)
WBC: 7.1 10*3/uL (ref 4.0–10.5)

## 2016-11-30 LAB — BASIC METABOLIC PANEL
Anion gap: 13 (ref 5–15)
BUN: 9 mg/dL (ref 6–20)
CALCIUM: 9.5 mg/dL (ref 8.9–10.3)
CO2: 23 mmol/L (ref 22–32)
CREATININE: 0.75 mg/dL (ref 0.61–1.24)
Chloride: 104 mmol/L (ref 101–111)
Glucose, Bld: 131 mg/dL — ABNORMAL HIGH (ref 65–99)
Potassium: 3.5 mmol/L (ref 3.5–5.1)
SODIUM: 140 mmol/L (ref 135–145)

## 2016-11-30 MED ORDER — OXYCODONE-ACETAMINOPHEN 5-325 MG PO TABS
2.0000 | ORAL_TABLET | Freq: Once | ORAL | Status: DC
  Filled 2016-11-30: qty 2

## 2016-11-30 NOTE — ED Provider Notes (Signed)
[facility] DEPT Provider Note   CSN: [id number] Arrival date & time: 11/30/16  1411   By signing my name below, I, Soijett Blue, attest that this documentation has been prepared under the direction and in the presence of Pattricia Boss, MD. Electronically Signed: Soijett Blue, ED Scribe. 11/30/16. 3:07 PM.  History   Chief Complaint Chief Complaint  Patient presents with  . severe headache    HPI Jesse Esparza is a 51 y.o. male with a PMHx of HTN, who presents to the Emergency Department complaining of intermittent HA onset 1 week worsening 2 days ago and today. Pt reports associated photophobia, nausea, dizziness x 1 week, chills, neck pain, resolved SOB x 1 week, and resolved mid back pain x 1 week. Pt has not tried any medications for the relief of his symptoms. Pt notes that he hasn't taken his blood pressure medications for the past 4 months. He denies fever and any other symptoms. Denies anticoagulant use or PMHx of DM. 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   The history is provided by the patient. No language interpreter was used.    Past Medical History:  Diagnosis Date  . Depression   . Elevated liver function tests   . Gallbladder sludge   . GERD (gastroesophageal reflux disease)   . Hypercholesteremia   . Hypertension   . Obesity     Patient Active Problem List   Diagnosis Date Noted  . Spinal stenosis of lumbar region 05/04/2016  . Adrenal nodule ([place]) 04/16/2016  . Hyperbilirubinemia 03/31/2016  . GERD (gastroesophageal reflux disease) 03/19/2016  . Essential hypertension, benign 03/13/2014  . Elevated BP 12/14/2013  . Obesity, unspecified 12/14/2013    Past Surgical History:  Procedure Laterality Date  . COLONOSCOPY  2016  . left testicular  childhood  . tumor on right breast  childhood       Home Medications    Prior to Admission medications   Medication Sig Start Date End Date Taking? Authorizing Provider  cyclobenzaprine  (FLEXERIL) 10 MG tablet Take 1 tablet (10 mg total) by mouth 2 (two) times daily as needed for muscle spasms. 06/16/16   Arnoldo Morale, MD  naproxen (NAPROSYN) 500 MG tablet Take 1 tablet (500 mg total) by mouth 2 (two) times daily with a meal. 06/16/16   Arnoldo Morale, MD    Family History Family History  Problem Relation Age of Onset  . Hyperlipidemia Father   . Hyperlipidemia Mother   . Colon cancer Neg Hx   . Esophageal cancer Neg Hx   . Rectal cancer Neg Hx   . Stomach cancer Neg Hx     Social History Social History  Substance Use Topics  . Smoking status: Never Smoker  . Smokeless tobacco: Never Used  . Alcohol use 1.2 - 1.8 oz/week    2 - 3 Shots of liquor per week     Comment: last drink 6-7 months ago     Allergies   Patient has no known allergies.   Review of Systems Review of Systems  Constitutional: Positive for chills. Negative for fever.  Eyes: Positive for photophobia.  Respiratory: Positive for shortness of breath (resolved).   Gastrointestinal: Positive for nausea.  Musculoskeletal: Positive for back pain (resolved) and neck pain.  Neurological: Positive for dizziness and headaches.  All other systems reviewed and are negative.    Physical Exam Updated Vital Signs BP (!) 126/96 (BP Location: Right Arm)   Pulse 85   Temp 98.2 F (  36.8 C) (Oral)   Resp 18   SpO2 100%   Physical Exam  Constitutional: He is oriented to person, place, and time. He appears well-developed and well-nourished. No distress.  HENT:  Head: Normocephalic and atraumatic.  Eyes: EOM are normal.  Neck: Neck supple.  Pt complained of pain with movement of neck. No meningismus signs. Neck supple.  Cardiovascular: Normal rate, regular rhythm and normal heart sounds.  Exam reveals no gallop and no friction rub.   No murmur heard. Pulmonary/Chest: Effort normal and breath sounds normal. No respiratory distress. He has no wheezes. He has no rales.  Abdominal: He exhibits no  distension.  Musculoskeletal: Normal range of motion.  Neurological: He is alert and oriented to person, place, and time.  Negative pronator drift. Nl finger-to-nose test. Steady gait. DTR's equal and intact to patella bilaterally.   Skin: Skin is warm and dry.  Psychiatric: He has a normal mood and affect. His behavior is normal.  Nursing note and vitals reviewed.    ED Treatments / Results  DIAGNOSTIC STUDIES: Oxygen Saturation is 100% on RA, nl by my interpretation.    COORDINATION OF CARE: 3:05 PM Discussed treatment plan with pt at bedside which includes CT head and pt agreed to plan.  4:37 PM-Pt reassessed and pt symptoms have subsided at this time.    Labs (all labs ordered are listed, but only abnormal results are displayed) Labs Reviewed  CBC - Abnormal; Notable for the following:       Result Value   Platelets 125 (*)    All other components within normal limits  BASIC METABOLIC PANEL - Abnormal; Notable for the following:    Glucose, Bld 131 (*)    All other components within normal limits    EKG  EKG Interpretation None       Radiology Ct Head Wo Contrast  Result Date: 11/30/2016 CLINICAL DATA:  Right-sided headache for 1 week. EXAM: CT HEAD WITHOUT CONTRAST TECHNIQUE: Contiguous axial images were obtained from the base of the skull through the vertex without intravenous contrast. COMPARISON:  None. FINDINGS: Brain: No evidence of acute infarction, hemorrhage, hydrocephalus, extra-axial collection or mass lesion/mass effect. Prominence of the sulci and ventricles identified. Vascular: No hyperdense vessel or unexpected calcification. Skull: Normal. Negative for fracture or focal lesion. Sinuses/Orbits: No acute finding. Other: None. IMPRESSION: 1. No acute intracranial abnormalities. 2. Prominence of the sulci and ventricles compatible with brain atrophy. Electronically Signed   By: Kerby Moors M.D.   On: 11/30/2016 16:20    Procedures Procedures (including  critical care time)  Medications Ordered in ED Medications - No data to display   Initial Impression / Assessment and Plan / ED Course  I have reviewed the triage vital signs and the nursing notes.  Pertinent labs & imaging results that were available during my care of the patient were reviewed by me and considered in my medical decision making (see chart for details).     Patient appears stable here- no s/s/ infection, neck supple, no signs of bledding or mass.  Discussed alcohol intake with patient.  Final Clinical Impressions(s) / ED Diagnoses   Final diagnoses:  Acute nonintractable headache, unspecified headache type    New Prescriptions New Prescriptions   No medications on file   I personally performed the services described in this documentation, which was scribed in my presence. The recorded information has been reviewed and considered.    Pattricia Boss, MD 11/30/16 [phone]

## 2016-11-30 NOTE — ED Notes (Signed)
ED Provider at bedside. 

## 2016-11-30 NOTE — ED Notes (Signed)
Pt reports he has been out of BP medication X 4 months.

## 2016-11-30 NOTE — ED Notes (Signed)
Pt c/o severe pain when starting IV pt begging for something for pain. Upon entering pt room and attempting to give percocet pt refuses stating he does not need anything that strong. This Probation officer asked pt what he takes at home for pain or what he thinks will work and pt states he does not want anything for the pain. MD made aware.

## 2016-11-30 NOTE — ED Notes (Signed)
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

## 2016-11-30 NOTE — Discharge Instructions (Signed)
General Headache Without Cause A headache is pain or discomfort felt around the head or neck area. The specific cause of a headache may not be found. There are many causes and types of headaches. A few common ones are:  Tension headaches.  Migraine headaches.  Cluster headaches.  Chronic daily headaches.  Follow these instructions at home: Watch your condition for any changes. Take these steps to help with your condition: Managing pain  Take over-the-counter and prescription medicines only as told by your health care provider.  Lie down in a dark, quiet room when you have a headache.  If directed, apply ice to the head and neck area: ? Put ice in a plastic bag. ? Place a towel between your skin and the bag. ? Leave the ice on for 20 minutes, 2-3 times per day.  Use a heating pad or hot shower to apply heat to the head and neck area as told by your health care provider.  Keep lights dim if bright lights bother you or make your headaches worse. Eating and drinking  Eat meals on a regular schedule.  Limit alcohol use.  Decrease the amount of caffeine you drink, or stop drinking caffeine. General instructions  Keep all follow-up visits as told by your health care provider. This is important.  Keep a headache journal to help find out what may trigger your headaches. For example, write down: ? What you eat and drink. ? How much sleep you get. ? Any change to your diet or medicines.  Try massage or other relaxation techniques.  Limit stress.  Sit up straight, and do not tense your muscles.  Do not use tobacco products, including cigarettes, chewing tobacco, or e-cigarettes. If you need help quitting, ask your health care provider.  Exercise regularly as told by your health care provider.  Sleep on a regular schedule. Get 7-9 hours of sleep, or the amount recommended by your health care provider. Contact a health care provider if:  Your symptoms are not helped by  medicine.  You have a headache that is different from the usual headache.  You have nausea or you vomit.  You have a fever. Get help right away if:  Your headache becomes severe.  You have repeated vomiting.  You have a stiff neck.  You have a loss of vision.  You have problems with speech.  You have pain in the eye or ear.  You have muscular weakness or loss of muscle control.  You lose your balance or have trouble walking.  You feel faint or pass out.  You have confusion. This information is not intended to replace advice given to you by your health care provider. Make sure you discuss any questions you have with your health care provider. Document Released: 08/16/2005 Document Revised: 01/22/2016 Document Reviewed: 12/09/2014 Elsevier Interactive Patient Education  2017 [employer]  

## 2016-11-30 NOTE — ED Triage Notes (Signed)
Patient complains of severe headache for 1 week that has gotten worse the past 2 days. States that he hasn't taken BP meds for weeks/months. Alert and oriented, BP normal. Positive photophobia with nausea. Denies trauma. States worse headache of his life

## 2016-11-30 NOTE — ED Notes (Signed)
Spoke with Dr Regenia Skeeter and he ordered head CT

## 2016-12-07 ENCOUNTER — Emergency Department (HOSPITAL_COMMUNITY): Payer: Self-pay

## 2016-12-07 ENCOUNTER — Encounter (HOSPITAL_COMMUNITY): Payer: Self-pay | Admitting: Emergency Medicine

## 2016-12-07 ENCOUNTER — Emergency Department (HOSPITAL_COMMUNITY)
Admission: EM | Admit: 2016-12-07 | Discharge: 2016-12-07 | Disposition: A | Discharge status: Home / Self Care | Payer: Self-pay | Attending: Emergency Medicine | Admitting: Emergency Medicine

## 2016-12-07 DIAGNOSIS — I10 Essential (primary) hypertension: Secondary | ICD-10-CM | POA: Insufficient documentation

## 2016-12-07 DIAGNOSIS — R42 Dizziness and giddiness: Secondary | ICD-10-CM | POA: Insufficient documentation

## 2016-12-07 DIAGNOSIS — Z79899 Other long term (current) drug therapy: Secondary | ICD-10-CM | POA: Insufficient documentation

## 2016-12-07 MED ORDER — GADOBENATE DIMEGLUMINE 529 MG/ML IV SOLN
20.0000 mL | Freq: Once | INTRAVENOUS | Status: AC | PRN
  Administered 2016-12-07: 19 mL via INTRAVENOUS

## 2016-12-07 MED ORDER — AMLODIPINE BESYLATE 5 MG PO TABS: 5.0000 mg | ORAL_TABLET | Freq: Every day | ORAL | 1 refills | Status: DC

## 2016-12-07 MED ORDER — ONDANSETRON HCL 4 MG/2ML IJ SOLN
4.0000 mg | Freq: Once | INTRAMUSCULAR | Status: AC
  Administered 2016-12-07: 4 mg via INTRAVENOUS
  Filled 2016-12-07: qty 2

## 2016-12-07 MED ORDER — MECLIZINE HCL 25 MG PO TABS: 25.0000 mg | ORAL_TABLET | Freq: Three times a day (TID) | ORAL | 0 refills | Status: DC | PRN

## 2016-12-07 MED FILL — ?MECLIZINE 25 MG TABLET: 25 | 10 days supply | Qty: 30 | Fill #0

## 2016-12-07 MED FILL — AMLODIPINE BESYLATE 5 MG TA: 5 | 30 days supply | Qty: 30 | Fill #0

## 2016-12-07 NOTE — ED Triage Notes (Signed)
Patient reports headache x 3 days with nausea, numbness in hands and sensitivity to lights and sounds and movement.  Patient reports he has HTN but doesn't take any medications for it.

## 2016-12-07 NOTE — ED Notes (Signed)
Patient transported to MRI 

## 2016-12-07 NOTE — Discharge Instructions (Signed)
Return if any problems.  Schedule appointment to see primary care MD for treatment of high blood pressure

## 2016-12-07 NOTE — ED Provider Notes (Signed)
[facility] DEPT Provider Note   CSN: [id number] Arrival date & time: 12/07/16  9702     History   Chief Complaint Chief Complaint  Patient presents with  . Headache  . Dizziness    HPI Jesse Esparza is a 51 y.o. male.  The history is provided by the patient. No language interpreter was used.  Headache   This is a new problem. The current episode started more than 1 week ago. The problem occurs constantly. The problem has been gradually worsening. The headache is associated with nothing. The pain is located in the temporal region. The quality of the pain is described as sharp. The pain is moderate. The pain does not radiate. Associated symptoms include nausea. He has tried nothing for the symptoms. The treatment provided no relief.  Dizziness  Associated symptoms: headaches and nausea    Pt complains of severe dizziness.  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 Pt has been taking his father blood pressure medication but his blood pressure is still high Past Medical History:  Diagnosis Date  . Depression   . Elevated liver function tests   . Gallbladder sludge   . GERD (gastroesophageal reflux disease)   . Hypercholesteremia   . Hypertension   . Obesity     Patient Active Problem List   Diagnosis Date Noted  . Spinal stenosis of lumbar region 05/04/2016  . Adrenal nodule ([place]) 04/16/2016  . Hyperbilirubinemia 03/31/2016  . GERD (gastroesophageal reflux disease) 03/19/2016  . Essential hypertension, benign 03/13/2014  . Elevated BP 12/14/2013  . Obesity, unspecified 12/14/2013    Past Surgical History:  Procedure Laterality Date  . COLONOSCOPY  2016  . left testicular  childhood  . tumor on right breast  childhood       Home Medications    Prior to Admission medications   Medication Sig Start Date End Date Taking? Authorizing Provider  metoprolol tartrate (LOPRESSOR) 25 MG tablet Take 25 mg by  mouth once.   Yes Historical Provider, MD  amLODipine (NORVASC) 5 MG tablet Take 1 tablet (5 mg total) by mouth daily. 12/07/16   Fransico Meadow, PA-C  meclizine (ANTIVERT) 25 MG tablet Take 1 tablet (25 mg total) by mouth 3 (three) times daily as needed for dizziness. 12/07/16   Fransico Meadow, PA-C    Family History Family History  Problem Relation Age of Onset  . Hyperlipidemia Father   . Hyperlipidemia Mother   . Colon cancer Neg Hx   . Esophageal cancer Neg Hx   . Rectal cancer Neg Hx   . Stomach cancer Neg Hx     Social History Social History  Substance Use Topics  . Smoking status: Never Smoker  . Smokeless tobacco: Never Used  . Alcohol use 1.2 - 1.8 oz/week    2 - 3 Shots of liquor per week     Comment: last drink 6-7 months ago     Allergies   Patient has no known allergies.   Review of Systems Review of Systems  Gastrointestinal: Positive for nausea.  Neurological: Positive for dizziness and headaches.  All other systems reviewed and are negative.    Physical Exam Updated Vital Signs BP (!) 164/93 (BP Location: Right Arm)   Pulse 91   Temp 98.1 F (36.7 C) (Oral)   Resp 12   SpO2 98%   Physical Exam  Constitutional: He appears well-developed and well-nourished.  HENT:  Head: Normocephalic and atraumatic.  Right Ear: External ear normal.  Left Ear: External ear normal.  Nose: Nose normal.  Mouth/Throat: Oropharynx is clear and moist.  Eyes: Conjunctivae are normal.  No nystagmus  Neck: Neck supple.  Cardiovascular: Normal rate and regular rhythm.   No murmur heard. Pulmonary/Chest: Effort normal and breath sounds normal. No respiratory distress.  Abdominal: Soft. There is no tenderness.  Musculoskeletal: Normal range of motion. He exhibits no edema.  Neurological: He is alert. He displays normal reflexes. No cranial nerve deficit or sensory deficit. Coordination normal.  Skin: Skin is warm and dry.  Psychiatric: He has a normal mood and  affect.  Nursing note and vitals reviewed.    ED Treatments / Results  Labs (all labs ordered are listed, but only abnormal results are displayed) Labs Reviewed - No data to display  EKG  EKG Interpretation None       Radiology Mr Jodene Nam Head Wo Contrast  Result Date: 12/07/2016 CLINICAL DATA:  Headaches for 3 days with nausea. Numbness in the hands. Sensitivity to light, sounds, and movement. EXAM: MRI HEAD WITHOUT CONTRAST MRA HEAD WITHOUT CONTRAST TECHNIQUE: Multiplanar, multiecho pulse sequences of the brain and surrounding structures were obtained without intravenous contrast. Angiographic images of the head were obtained using MRA technique without contrast. COMPARISON:  CT head without contrast 11/30/2016 FINDINGS: MRI HEAD FINDINGS Brain: Minimal periventricular and subcortical T2 changes are within normal limits for age. No acute infarct, hemorrhage, or mass lesion is present. The ventricles are of normal size. No significant extraaxial fluid collection is present. The internal auditory canals are within normal limits bilaterally. The brainstem and cerebellum are normal. The postcontrast images demonstrate no pathologic enhancement. Vascular: Flow is present in the major intracranial arteries. Skull and upper cervical spine: The skullbase is within normal limits. The craniocervical junction is normal. Midline sagittal structures are unremarkable. Sinuses/Orbits: Paranasal sinuses and mastoid air cells are clear. The globes and orbits are normal. MRA HEAD FINDINGS The internal carotid arteries are within normal limits from the high cervical segments through the ICA termini bilaterally. The A1 and M1 segments normal. The anterior communicating artery is patent. The MCA bifurcations are normal. ACA and MCA branch vessels are within normal limits bilaterally. The left vertebral artery is the dominant vessel. The right PICA origin is visualized and normal. The right AICA is seen. The basilar  artery is normal. Both posterior cerebral arteries originate from the basilar tip. PCA branch vessels are within normal limits bilaterally. IMPRESSION: 1. Normal MRI the brain for age. 2. Normal MRA circle of Willis without significant proximal stenosis, aneurysm, or branch vessel occlusion. Electronically Signed   By: San Morelle M.D.   On: 12/07/2016 13:11   Mr Jeri Cos DZ Contrast  Result Date: 12/07/2016 CLINICAL DATA:  Headaches for 3 days with nausea. Numbness in the hands. Sensitivity to light, sounds, and movement. EXAM: MRI HEAD WITHOUT CONTRAST MRA HEAD WITHOUT CONTRAST TECHNIQUE: Multiplanar, multiecho pulse sequences of the brain and surrounding structures were obtained without intravenous contrast. Angiographic images of the head were obtained using MRA technique without contrast. COMPARISON:  CT head without contrast 11/30/2016 FINDINGS: MRI HEAD FINDINGS Brain: Minimal periventricular and subcortical T2 changes are within normal limits for age. No acute infarct, hemorrhage, or mass lesion is present. The ventricles are of normal size. No significant extraaxial fluid collection is present. The internal auditory canals are within normal limits bilaterally. The brainstem and cerebellum are normal. The postcontrast images demonstrate no pathologic  enhancement. Vascular: Flow is present in the major intracranial arteries. Skull and upper cervical spine: The skullbase is within normal limits. The craniocervical junction is normal. Midline sagittal structures are unremarkable. Sinuses/Orbits: Paranasal sinuses and mastoid air cells are clear. The globes and orbits are normal. MRA HEAD FINDINGS The internal carotid arteries are within normal limits from the high cervical segments through the ICA termini bilaterally. The A1 and M1 segments normal. The anterior communicating artery is patent. The MCA bifurcations are normal. ACA and MCA branch vessels are within normal limits bilaterally. The left  vertebral artery is the dominant vessel. The right PICA origin is visualized and normal. The right AICA is seen. The basilar artery is normal. Both posterior cerebral arteries originate from the basilar tip. PCA branch vessels are within normal limits bilaterally. IMPRESSION: 1. Normal MRI the brain for age. 2. Normal MRA circle of Willis without significant proximal stenosis, aneurysm, or branch vessel occlusion. Electronically Signed   By: San Morelle M.D.   On: 12/07/2016 13:11    Procedures Procedures (including critical care time)  Medications Ordered in ED Medications  ondansetron (ZOFRAN) injection 4 mg (4 mg Intravenous Given 12/07/16 1053)  gadobenate dimeglumine (MULTIHANCE) injection 20 mL (19 mLs Intravenous Contrast Given 12/07/16 1138)     Initial Impression / Assessment and Plan / ED Course  I have reviewed the triage vital signs and the nursing notes.  Pertinent labs & imaging results that were available during my care of the patient were reviewed by me and considered in my medical decision making (see chart for details).  Clinical Course as of Dec 07 1525  Tue Dec 07, 2016  1327 MR MRA HEAD WO CONTRAST [LS]  1194 MR BRAIN W WO CONTRAST [LS]    Clinical Course User Index [LS] Fransico Meadow, PA-C    Mri due to severity of dizziness and headache.  Mri/MRA normal.  I will given pr rx for norvasc (his previous BP medication per [facility])  Antivert for dizziness.  Pt given note for out of work.  Pt needs to follow up with [facility]. Pt advised no driving until dizziness improves/resolves/  Final Clinical Impressions(s) / ED Diagnoses   Final diagnoses:  Vertigo  Essential hypertension    New Prescriptions Discharge Medication List as of 12/07/2016  1:34 PM    START taking these medications   Details  amLODipine (NORVASC) 5 MG tablet Take 1 tablet (5 mg total) by mouth daily., Starting Tue 12/07/2016, Print    meclizine (ANTIVERT) 25 MG tablet  Take 1 tablet (25 mg total) by mouth 3 (three) times daily as needed for dizziness., Starting Tue 12/07/2016, Print      An After Visit Summary was printed and given to the patient.   Hollace Kinnier Eugenio Saenz, PA-C 12/07/16 1533    Charlesetta Shanks, MD 12/07/16 1620

## 2016-12-07 NOTE — ED Notes (Signed)
Pt ambulatory and independent at discharge.  Verbalized understanding of discharge instructions 

## 2016-12-10 ENCOUNTER — Ambulatory Visit: Payer: Self-pay | Attending: Family Medicine | Admitting: Physician Assistant

## 2016-12-10 ENCOUNTER — Encounter (INDEPENDENT_AMBULATORY_CARE_PROVIDER_SITE_OTHER): Payer: Self-pay

## 2016-12-10 VITALS — BP 134/83 | HR 89 | Temp 98.9°F | Resp 16 | Wt 209.6 lb

## 2016-12-10 DIAGNOSIS — R739 Hyperglycemia, unspecified: Secondary | ICD-10-CM

## 2016-12-10 DIAGNOSIS — Z8249 Family history of ischemic heart disease and other diseases of the circulatory system: Secondary | ICD-10-CM | POA: Insufficient documentation

## 2016-12-10 DIAGNOSIS — Z6834 Body mass index (BMI) 34.0-34.9, adult: Secondary | ICD-10-CM | POA: Insufficient documentation

## 2016-12-10 DIAGNOSIS — K219 Gastro-esophageal reflux disease without esophagitis: Secondary | ICD-10-CM | POA: Insufficient documentation

## 2016-12-10 DIAGNOSIS — K029 Dental caries, unspecified: Secondary | ICD-10-CM

## 2016-12-10 DIAGNOSIS — E7800 Pure hypercholesterolemia, unspecified: Secondary | ICD-10-CM | POA: Insufficient documentation

## 2016-12-10 DIAGNOSIS — R51 Headache: Secondary | ICD-10-CM

## 2016-12-10 DIAGNOSIS — I10 Essential (primary) hypertension: Secondary | ICD-10-CM

## 2016-12-10 DIAGNOSIS — R519 Headache, unspecified: Secondary | ICD-10-CM

## 2016-12-10 DIAGNOSIS — E669 Obesity, unspecified: Secondary | ICD-10-CM | POA: Insufficient documentation

## 2016-12-10 MED ORDER — AMLODIPINE BESYLATE 5 MG PO TABS: 5.0000 mg | ORAL_TABLET | Freq: Every day | ORAL | 1 refills | Status: DC

## 2016-12-10 NOTE — Progress Notes (Signed)
[name], male   DOB: July 30, 1966, 51 y.o.   MRN: [id number]     Jesse Esparza, is a 51 y.o. male  FOY:[id number]  [id number]:[id number]  DOB - Jan 01, 1966  Subjective:  Chief Complaint and HPI: Jesse Esparza is a 50 y.o. male here today to establish care and for a follow up visit After being seen in the ED for HA on 11/30/2016 and had been off htn meds for several months.  He returned to the ED on 12/07/2016 for continued HA and dizziness associated with nausea.  He had been taking his dad's BP meds but they weren't helping manage his BP. CT head, MRA/MRI brain all negative. Patient was treated with Zofran in ED, restarted on amlodipine and given antivert for dizziness and considered stable for discharge.  Today he presents with complete resolution of his symptoms of dizziness, nausea, and headache.  He feels great.  BP is much improved.  No weakness.   ED/Hospital notes reviewed.  Labs with glucose 131, Platelets 125.  CT/MRI/MRA results reviewed.  Summary as above. Family history:  Mom and dad with hyperlipidemia, dad with htn  ROS:   Constitutional:  No f/c, No night sweats, No unexplained weight loss. EENT:  No vision changes, No blurry vision, No hearing changes. No mouth, throat, or ear problems.  Respiratory: No cough, No SOB Cardiac: No CP, no palpitations GI:  No abd pain, No N/V/D. GU: No Urinary s/sx Musculoskeletal: No joint pain Neuro: No headache, no dizziness, no motor weakness.  Skin: No rash Endocrine:  No polydipsia. No polyuria.  Psych: Denies SI/HI  No problems updated.  ALLERGIES: No Known Allergies  PAST MEDICAL HISTORY: Past Medical History:  Diagnosis Date  . Depression   . Elevated liver function tests   . Gallbladder sludge   . GERD (gastroesophageal reflux disease)   . Hypercholesteremia   . Hypertension   . Obesity     MEDICATIONS AT HOME: Prior to Admission medications   Medication Sig Start Date End Date Taking? Authorizing  Provider  amLODipine (NORVASC) 5 MG tablet Take 1 tablet (5 mg total) by mouth daily. 12/10/16   Argentina Donovan, PA-C  meclizine (ANTIVERT) 25 MG tablet Take 1 tablet (25 mg total) by mouth 3 (three) times daily as needed for dizziness. 12/07/16   Fransico Meadow, PA-C     Objective:  EXAM:   Vitals:   12/10/16 0843  BP: 134/83  Pulse: 89  Resp: 16  Temp: 98.9 F (37.2 C)  TempSrc: Oral  SpO2: 98%  Weight: 209 lb 9.6 oz (95.1 kg)    General appearance : A&OX3. NAD. Non-toxic-appearing HEENT: Atraumatic and Normocephalic.  PERRLA. EOM intact.  TM clear B. Mouth-MMM, post pharynx WNL w/o erythema, No PND. Neck: supple, no JVD. No cervical lymphadenopathy. No thyromegaly Chest/Lungs:  Breathing-non-labored, Good air entry bilaterally, breath sounds normal without rales, rhonchi, or wheezing  CVS: S1 S2 regular, no murmurs, gallops, rubs  Extremities: Bilateral Lower Ext shows no edema, both legs are warm to touch with = pulse throughout Neurology:  CN II-XII grossly intact, Non focal.  Normal extremity and facial symmetry, Strength, and ROM. Psych:  TP linear. J/I WNL. Normal speech. Appropriate eye contact and affect.  Skin:  No Rash  Data Review Lab Results  Component Value Date   HGBA1C 5.0 06/06/2015     Assessment & Plan   1. Essential hypertension, benign Improving/almost at normotensive! continue- amLODipine (NORVASC) 5 MG tablet; Take 1 tablet (5 mg  total) by mouth daily.  Dispense: 90 tablet; Refill: 1 check blood pressure and pulse daily and record and bring to next visit. With normal CT head/normal MRA/MRI brain and normal neurological exam with complete resolution of all of his symptoms, he should be safe to return to work at full duty.  2. Nonintractable headache, unspecified chronicity pattern, unspecified headache type Resolved with improving of BP.  3. Dental caries - Ambulatory referral to Dentistry  4. Hyperglycemia I have had a lengthy discussion  and provided education about insulin resistance and the intake of too much sugar/refined carbohydrates.  I have advised the patient to work at a goal of eliminating sugary drinks, candy, desserts, sweets, refined sugars, processed foods, and white carbohydrates.  The patient expresses understanding.  Information provided.   Patient have been counseled extensively about nutrition and exercise  Return in about 3 weeks (around 12/31/2016) for Dr. Jarold Song; f/up htn. recheck CBC,CMP  The patient was given clear instructions to go to ER or return to medical center if symptoms don't improve, worsen or new problems develop. The patient verbalized understanding. The patient was told to call to get lab results if they haven't heard anything in the next week.     Freeman Caldron, PA-C [facility] and [facility] [place], [place]   12/10/2016, 8:56 AM

## 2016-12-10 NOTE — Patient Instructions (Signed)
Check blood pressure and pulse daily and record and bring to next visit.  Hyperglycemia Hyperglycemia is when the sugar (glucose) level in your blood is too high. It may not cause symptoms. If you do have symptoms, they may include warning signs, such as:  Feeling more thirsty than normal.  Hunger.  Feeling tired.  Needing to pee (urinate) more than normal.  Blurry eyesight (vision). You may get other symptoms as it gets worse, such as:  Dry mouth.  Not being hungry (loss of appetite).  Fruity-smelling breath.  Weakness.  Weight gain or loss that is not planned. Weight loss may be fast.  A tingling or numb feeling in your hands or feet.  Headache.  Skin that does not bounce back quickly when it is lightly pinched and released (poor skin turgor).  Pain in your belly (abdomen).  Cuts or bruises that heal slowly. High blood sugar can happen to people who do or do not have diabetes. High blood sugar can happen slowly or quickly, and it can be an emergency. Follow these instructions at home: General instructions   Take over-the-counter and prescription medicines only as told by your doctor.  Do not use products that contain nicotine or tobacco, such as cigarettes and e-cigarettes. If you need help quitting, ask your doctor.  Limit alcohol intake to no more than 1 drink per day for nonpregnant women and 2 drinks per day for men. One drink equals 12 oz of beer, 5 oz of wine, or 1 oz of hard liquor.  Manage stress. If you need help with this, ask your doctor.  Keep all follow-up visits as told by your doctor. This is important. Eating and drinking   Stay at a healthy weight.  Exercise regularly, as told by your doctor.  Drink enough fluid, especially when you:  Exercise.  Get sick.  Are in hot temperatures.  Eat healthy foods, such as:  Low-fat (lean) proteins.  Complex carbs (complex carbohydrates), such as whole wheat bread or brown rice.  Fresh fruits  and vegetables.  Low-fat dairy products.  Healthy fats.  Drink enough fluid to keep your pee (urine) clear or pale yellow. If you have diabetes:   Make sure you know the symptoms of hyperglycemia.  Follow your diabetes management plan, as told by your doctor. Make sure you:  Take insulin and medicines as told.  Follow your exercise plan.  Follow your meal plan. Eat on time. Do not skip meals.  Check your blood sugar as often as told. Make sure to check before and after exercise. If you exercise longer or in a different way than you normally do, check your blood sugar more often.  Follow your sick day plan whenever you cannot eat or drink normally. Make this plan ahead of time with your doctor.  Share your diabetes management plan with people in your workplace, school, and household.  Check your urine for ketones when you are ill and as told by your doctor.  Carry a card or wear jewelry that says that you have diabetes. Contact a doctor if:  Your blood sugar level is higher than 240 mg/dL (13.3 mmol/L) for 2 days in a row.  You have problems keeping your blood sugar in your target range.  High blood sugar happens often for you. Get help right away if:  You have trouble breathing.  You have a change in how you think, feel, or act (mental status).  You feel sick to your stomach (nauseous), and that  feeling does not go away.  You cannot stop throwing up (vomiting). These symptoms may be an emergency. Do not wait to see if the symptoms will go away. Get medical help right away. Call your local emergency services (911 in the [place] Do not drive yourself to the hospital. Summary  Hyperglycemia is when the sugar (glucose) level in your blood is too high.  High blood sugar can happen to people who do or do not have diabetes.  Make sure you drink enough fluids, eat healthy foods, and exercise regularly.  Contact your doctor if you have problems keeping your blood sugar in  your target range. This information is not intended to replace advice given to you by your health care provider. Make sure you discuss any questions you have with your health care provider. Document Released: 06/13/2009 Document Revised: 05/03/2016 Document Reviewed: 05/03/2016 Elsevier Interactive Patient Education  2017 [employer].

## 2016-12-17 ENCOUNTER — Ambulatory Visit: Payer: Self-pay | Attending: Family Medicine

## 2017-01-03 ENCOUNTER — Ambulatory Visit: Payer: Self-pay | Admitting: Family Medicine

## 2017-01-05 MED FILL — AMLODIPINE BESYLATE 5 MG TA: 5 | 30 days supply | Qty: 30 | Fill #1

## 2017-01-13 IMAGING — CR DG FOOT COMPLETE 3+V*R*
3 series · 3 of 3 positions shown · non-contrast
Comparison: None.

CLINICAL DATA: Twisted right foot.

EXAM:
RIGHT FOOT COMPLETE - 3+ VIEW

[x foot ap right]
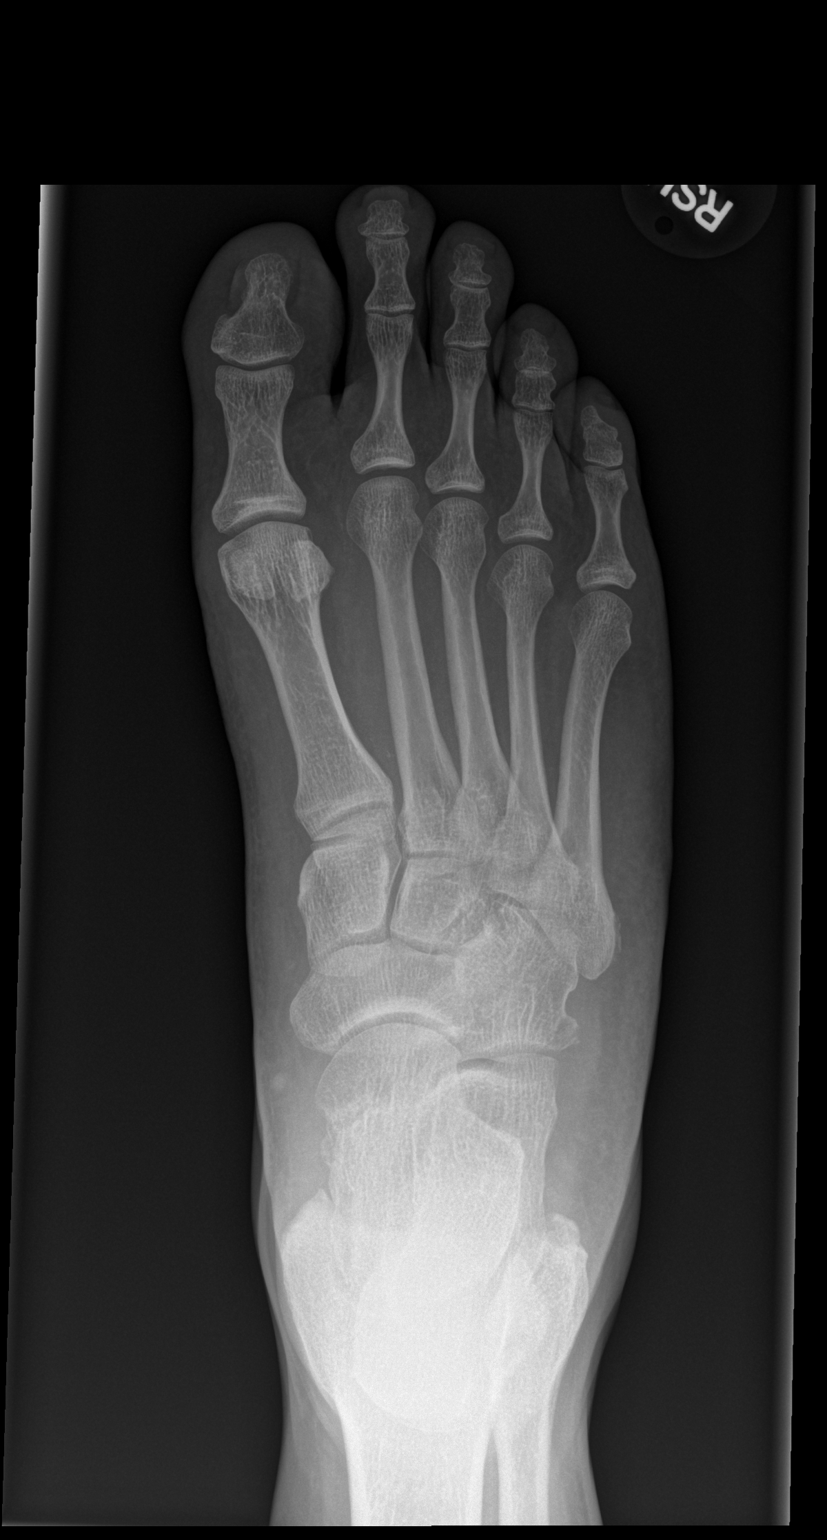

[x foot obl right]
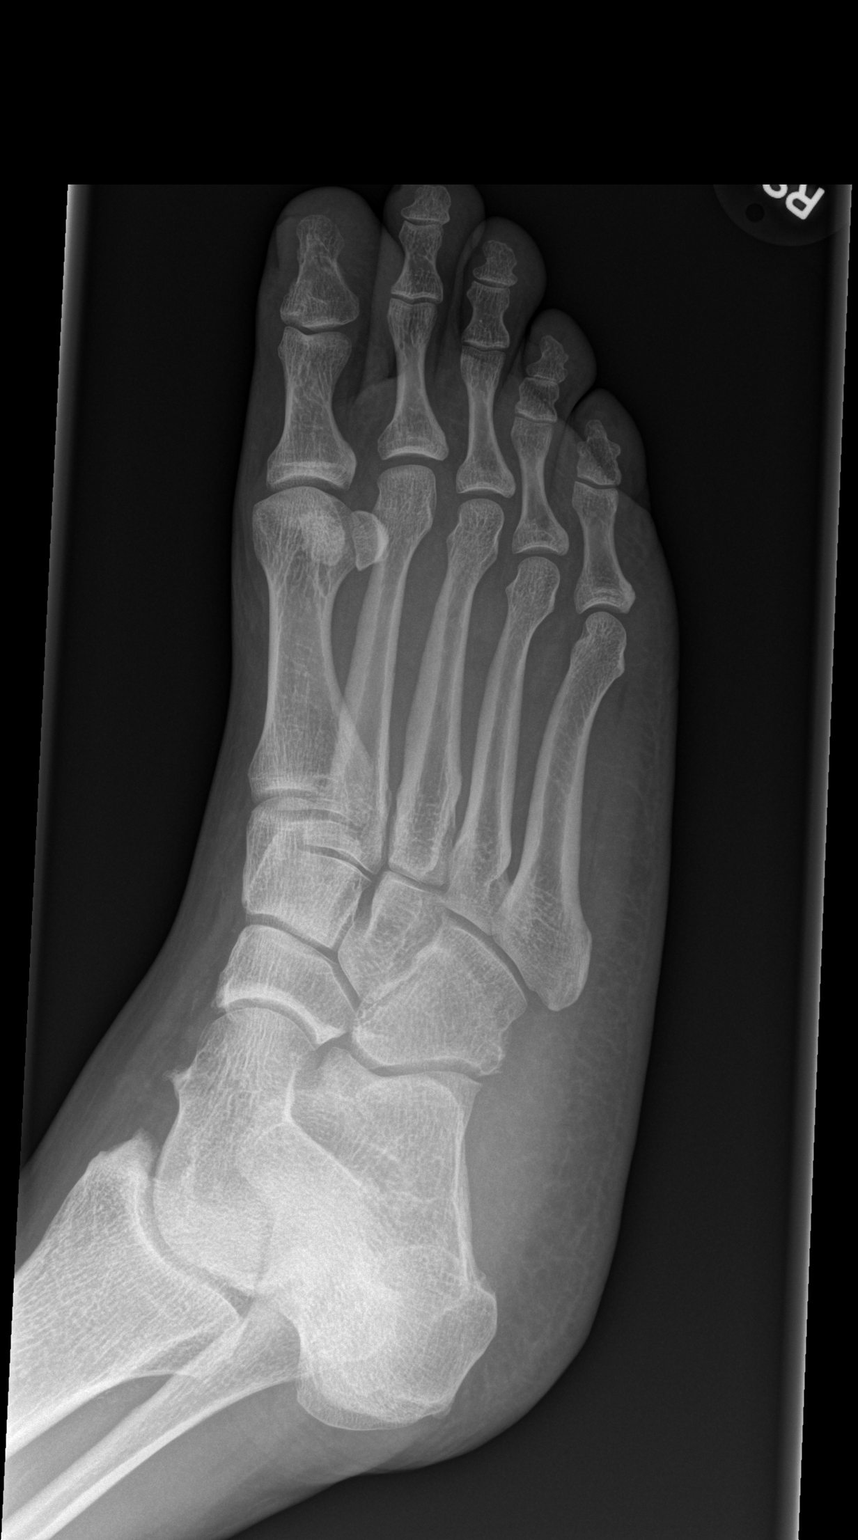

[x foot lat right]
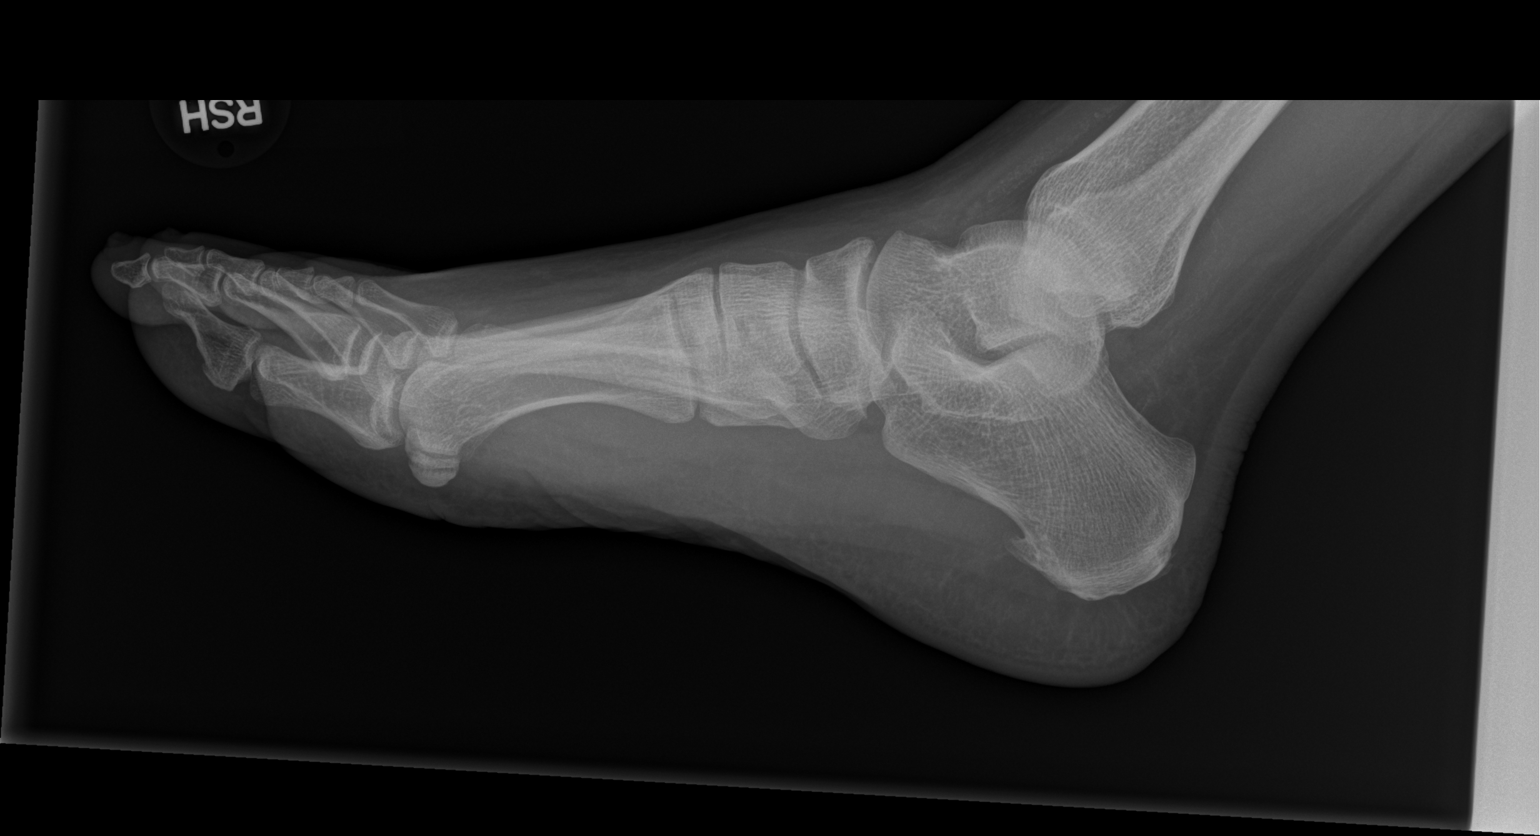

[3 of 3 positions shown; findings below may reference images not displayed]

FINDINGS: Tiny bony densities noted along the posterior aspect of the right
fifth metatarsal. This may represent old secondary ossification
center. Subtle fracture cannot be excluded. No significant
abnormality otherwise noted.
IMPRESSION: Tiny bony density noted along the posterior aspect of the right
fifth metatarsal, most likely secondary ossification center. Subtle
fracture fragment cannot be entirely excluded. Correlate with
clinical symptoms. No acute abnormality otherwise noted.

## 2017-02-04 MED FILL — ?AMLODIPINE BESYLATE 5 MG T: 5 | 30 days supply | Qty: 30 | Fill #0

## 2017-03-07 MED FILL — AMLODIPINE BESYLATE 5 MG TA: 5 | 30 days supply | Qty: 30 | Fill #1

## 2017-03-10 ENCOUNTER — Ambulatory Visit: Payer: Self-pay | Attending: Family Medicine | Admitting: Physician Assistant

## 2017-03-10 VITALS — BP 103/68 | HR 106 | Temp 98.4°F | Resp 18 | Ht 65.0 in | Wt 220.0 lb

## 2017-03-10 DIAGNOSIS — Z79899 Other long term (current) drug therapy: Secondary | ICD-10-CM | POA: Insufficient documentation

## 2017-03-10 DIAGNOSIS — K219 Gastro-esophageal reflux disease without esophagitis: Secondary | ICD-10-CM | POA: Insufficient documentation

## 2017-03-10 DIAGNOSIS — R110 Nausea: Secondary | ICD-10-CM | POA: Insufficient documentation

## 2017-03-10 DIAGNOSIS — E7800 Pure hypercholesterolemia, unspecified: Secondary | ICD-10-CM | POA: Insufficient documentation

## 2017-03-10 DIAGNOSIS — I10 Essential (primary) hypertension: Secondary | ICD-10-CM | POA: Insufficient documentation

## 2017-03-10 DIAGNOSIS — R739 Hyperglycemia, unspecified: Secondary | ICD-10-CM | POA: Insufficient documentation

## 2017-03-10 DIAGNOSIS — R0981 Nasal congestion: Secondary | ICD-10-CM | POA: Insufficient documentation

## 2017-03-10 DIAGNOSIS — F329 Major depressive disorder, single episode, unspecified: Secondary | ICD-10-CM | POA: Insufficient documentation

## 2017-03-10 DIAGNOSIS — E669 Obesity, unspecified: Secondary | ICD-10-CM | POA: Insufficient documentation

## 2017-03-10 LAB — POCT GLYCOSYLATED HEMOGLOBIN (HGB A1C): Hemoglobin A1C: 4.1

## 2017-03-10 LAB — GLUCOSE, POCT (MANUAL RESULT ENTRY): POC Glucose: 100 mg/dl — AB (ref 70–99)

## 2017-03-10 MED ORDER — ONDANSETRON HCL 8 MG PO TABS: 8.0000 mg | ORAL_TABLET | Freq: Three times a day (TID) | ORAL | 0 refills | Status: DC | PRN

## 2017-03-10 NOTE — Progress Notes (Signed)
Patient is here for HTN symptoms  Patient denies pain at this time.  Patient has taken medication today. Patient has eaten today.  Patient complains of intermittent nausea and dizziness throughout the day. No specific triggers.

## 2017-03-10 NOTE — Progress Notes (Signed)
Jesse Esparza, is a 51 y.o. male  DGU:[id number]  ZDG:[id number]  DOB - Jun 28, 1966  Subjective:  Chief Complaint and HPI: Jesse Esparza is a 52 y.o. male here today for 3-5 day h/o nasal congestion, sinus drainage, nausea, and decreased appetite.  No f/c.  No cough.  +some sneezing.  His eyes were itchy and red one day.  No vomiting.  No ST.  Hasn't taken any OTC meds.    He also wants to get his blood sugar checked.  Glucose was 131 11/30/2016  ROS:   Constitutional:  No f/c, No night sweats, No unexplained weight loss. EENT:  No vision changes, No blurry vision, No hearing changes. +congestion and some sneezing Respiratory: No cough, No SOB Cardiac: No CP, no palpitations GI:  No abd pain, +N, no V/D. GU: No Urinary s/sx Musculoskeletal: No joint pain Neuro: No headache, no dizziness, no motor weakness.  Skin: No rash Endocrine:  No polydipsia. No polyuria.  Psych: Denies SI/HI  No problems updated.  ALLERGIES: No Known Allergies  PAST MEDICAL HISTORY: Past Medical History:  Diagnosis Date  . Depression   . Elevated liver function tests   . Gallbladder sludge   . GERD (gastroesophageal reflux disease)   . Hypercholesteremia   . Hypertension   . Obesity     MEDICATIONS AT HOME: Prior to Admission medications   Medication Sig Start Date End Date Taking? Authorizing Provider  amLODipine (NORVASC) 5 MG tablet Take 1 tablet (5 mg total) by mouth daily. 12/10/16  Yes Freeman Caldron M, PA-C  ondansetron (ZOFRAN) 8 MG tablet Take 1 tablet (8 mg total) by mouth every 8 (eight) hours as needed for nausea or vomiting. 03/10/17   Argentina Donovan, PA-C     Objective:  EXAM:   Vitals:   03/10/17 1518  BP: 103/68  Pulse: (!) 106  Resp: 18  Temp: 98.4 F (36.9 C)  TempSrc: Oral  SpO2: 98%  Weight: 220 lb (99.8 kg)  Height: 5\' 5"  (1.651 m)    General appearance : A&OX3. NAD. Non-toxic-appearing HEENT: Atraumatic and Normocephalic.  PERRLA. EOM intact.  TM  full and congested B.  Turbinates enlarged and boggy.  Throat with PND but no erythema. Neck: supple, no JVD. No cervical lymphadenopathy. No thyromegaly Chest/Lungs:  Breathing-non-labored, Good air entry bilaterally, breath sounds normal without rales, rhonchi, or wheezing  CVS: S1 S2 regular, no murmurs, gallops, rubs . Extremities: Bilateral Lower Ext shows no edema, both legs are warm to touch with = pulse throughout Neurology:  CN II-XII grossly intact, Non focal.   Psych:  TP linear. J/I WNL. Normal speech. Appropriate eye contact and affect.  Skin:  No Rash  Data Review Lab Results  Component Value Date   HGBA1C 4.1 03/10/2017   HGBA1C 5.0 06/06/2015     Assessment & Plan   1. Essential hypertension, benign Well controlled.  Continue amlodipine 5mg  daily  2. Nausea Likely URI/viral syndrome or allergies causing the constellation of s/sx he is having - ondansetron (ZOFRAN) 8 MG tablet; Take 1 tablet (8 mg total) by mouth every 8 (eight) hours as needed for nausea or vomiting.  Dispense: 20 tablet; Refill: 0  3. Congestion of nasal sinus Refuses flonase Mucinex D ok temporarily bc BP is well controlled.  Zyrtec daily also advised.   4. Hyperglycemia No diabetes. - POCT glucose (manual entry) - HgB A1c   Patient have been counseled extensively about nutrition and exercise  Return in about 3 months (around 06/10/2017)  for Dr Jarold Song BP f/up.  The patient was given clear instructions to go to ER or return to medical center if symptoms don't improve, worsen or new problems develop. The patient verbalized understanding. The patient was told to call to get lab results if they haven't heard anything in the next week.     Freeman Caldron, PA-C [facility] and [facility], [place]   03/10/2017, 4:51 PMPatient ID: Jesse Esparza, male   DOB: 1966/04/25, 51 y.o.   MRN: [id number]

## 2017-04-08 MED FILL — AMLODIPINE BESYLATE 5 MG TA: 5 | 30 days supply | Qty: 30 | Fill #2

## 2017-05-12 ENCOUNTER — Other Ambulatory Visit: Payer: Self-pay | Admitting: Physician Assistant

## 2017-05-12 DIAGNOSIS — I10 Essential (primary) hypertension: Secondary | ICD-10-CM

## 2017-05-12 MED FILL — AMLODIPINE BESYLATE 5 MG TA: 5 | 90 days supply | Qty: 90 | Fill #3

## 2017-05-23 ENCOUNTER — Ambulatory Visit (HOSPITAL_COMMUNITY)
Admission: EM | Admit: 2017-05-23 | Discharge: 2017-05-23 | Disposition: A | Discharge status: Home / Self Care | Payer: Self-pay | Attending: Family Medicine | Admitting: Family Medicine

## 2017-05-23 ENCOUNTER — Encounter (HOSPITAL_COMMUNITY): Payer: Self-pay | Admitting: Emergency Medicine

## 2017-05-23 ENCOUNTER — Ambulatory Visit (INDEPENDENT_AMBULATORY_CARE_PROVIDER_SITE_OTHER): Payer: Self-pay

## 2017-05-23 DIAGNOSIS — R0602 Shortness of breath: Secondary | ICD-10-CM

## 2017-05-23 DIAGNOSIS — S20212A Contusion of left front wall of thorax, initial encounter: Secondary | ICD-10-CM

## 2017-05-23 DIAGNOSIS — R0782 Intercostal pain: Secondary | ICD-10-CM

## 2017-05-23 NOTE — ED Provider Notes (Addendum)
  [place]   [id number] 05/23/17 Arrival Time: [place] PLAN:  1. Rib contusion, left, initial encounter    Avoids NSAIDs. Will take Tylenol. Work note given. May return here as needed. No fractures on x-rays. Reviewed expectations re: course of current medical issues. Questions answered. Outlined signs and symptoms indicating need for more acute intervention. Patient verbalized understanding. After Visit Summary given.   SUBJECTIVE:  Jesse Esparza is a 51 y.o. male who reports falling 2 days ago after tripping. Landed on his L side. Abrupt onset of L rib pain. Stable. Worse with movement and deep breathing. No SOB reported. No OTC analgesics taken. No abdominal pain or n/v.  ROS: As per HPI.   OBJECTIVE:  Vitals:   05/23/17 1016  BP: 134/87  Pulse: (!) 103  Resp: 20  Temp: 98.9 F (37.2 C)  TempSrc: Oral  SpO2: 99%    General appearance: alert; no distress Lungs: clear to auscultation bilaterally Heart: regular rate and rhythm Chest: tender over lateral L ribs; no bruising or erythema; no gross deformities Abdomen: soft, non-tender Skin: warm and dry Psychological: alert and cooperative; normal mood and affect  Imaging: Dg Ribs Unilateral W/chest Left  Result Date: 05/23/2017 CLINICAL DATA:  Status post fall injuring the left ribcage 2 days ago. The patient reports significant touch tenderness when lying down. Also some shortness of breath. EXAM: LEFT RIBS AND CHEST - 3+ VIEW COMPARISON:  Chest x-ray of Jan 03, 2012 FINDINGS: The lungs are well-expanded and clear. There is no pneumothorax or pleural effusion. The heart and pulmonary vascularity are normal. There is multilevel endplate spurring of the thoracic spine. Left rib detail images reveal no acute displaced fractures. IMPRESSION: No acute post traumatic injury of the thorax is observed. There is no acute cardiopulmonary abnormality. Electronically Signed   By: David  Martinique M.D.   On:  05/23/2017 10:39    No Known Allergies  Past Medical History:  Diagnosis Date  . Depression   . Elevated liver function tests   . Gallbladder sludge   . GERD (gastroesophageal reflux disease)   . Hypercholesteremia   . Hypertension   . Obesity    Social History   Social History  . Marital status: Single    Spouse name: N/A  . Number of children: N/A  . Years of education: N/A   Occupational History  . Not on file.   Social History Main Topics  . Smoking status: Never Smoker  . Smokeless tobacco: Never Used  . Alcohol use 1.2 - 1.8 oz/week    2 - 3 Shots of liquor per week     Comment: last drink 6-7 months ago  . Drug use: No  . Sexual activity: Not on file   Other Topics Concern  . Not on file   Social History Narrative  . No narrative on file   Family History  Problem Relation Age of Onset  . Hyperlipidemia Father   . Hyperlipidemia Mother   . Colon cancer Neg Hx   . Esophageal cancer Neg Hx   . Rectal cancer Neg Hx   . Stomach cancer Neg Hx    Past Surgical History:  Procedure Laterality Date  . COLONOSCOPY  2016  . left testicular  childhood  . 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

## 2017-05-23 NOTE — ED Triage Notes (Signed)
Fell Saturday on his deck.  Tripped on a brick and landed on left side.  Patient is having pain on left torso/ribs.  No visible bruising.  Tender to palpation

## 2017-08-03 ENCOUNTER — Ambulatory Visit (HOSPITAL_COMMUNITY)
Admission: EM | Admit: 2017-08-03 | Discharge: 2017-08-03 | Disposition: A | Discharge status: Home / Self Care | Payer: Self-pay | Attending: Family Medicine | Admitting: Family Medicine

## 2017-08-03 ENCOUNTER — Encounter (HOSPITAL_COMMUNITY): Payer: Self-pay | Admitting: Emergency Medicine

## 2017-08-03 DIAGNOSIS — R059 Cough, unspecified: Secondary | ICD-10-CM

## 2017-08-03 DIAGNOSIS — R69 Illness, unspecified: Secondary | ICD-10-CM

## 2017-08-03 DIAGNOSIS — R05 Cough: Secondary | ICD-10-CM

## 2017-08-03 DIAGNOSIS — J111 Influenza due to unidentified influenza virus with other respiratory manifestations: Secondary | ICD-10-CM

## 2017-08-03 MED ORDER — HYDROCODONE-HOMATROPINE 5-1.5 MG/5ML PO SYRP: 5.0000 mL | ORAL_SOLUTION | Freq: Four times a day (QID) | ORAL | 0 refills | Status: DC | PRN

## 2017-08-03 NOTE — ED Provider Notes (Signed)
  [place]   [id number] 08/03/17 Arrival Time: 5400  ASSESSMENT & PLAN:  1. Influenza-like illness   2. Cough     Meds ordered this encounter  Medications  . HYDROcodone-homatropine (HYCODAN) 5-1.5 MG/5ML syrup    Sig: Take 5 mLs by mouth every 6 (six) hours as needed for cough.    Dispense:  90 mL    Refill:  0   Medication sedation precautions. Ensure adequate fluid intake and rest. Work note given. OTC symptom care as needed. May f/u here as needed.  Reviewed expectations re: course of current medical issues. Questions answered. Outlined signs and symptoms indicating need for more acute intervention. Patient verbalized understanding. After Visit Summary given.   SUBJECTIVE:  Jesse Esparza is a 51 y.o. male who presents with complaint of nasal congestion, post-nasal drainage, and a persistent cough. Onset abrupt approximately 2-3 days ago. Overall fatigued. SOB: none. Wheezing: none. Fever: no. Overall normal PO intake without n/v. Sick contacts: no. Normal PO intake.  Social History   Tobacco Use  Smoking Status Never Smoker  Smokeless Tobacco Never Used   OTC treatment: cough medication without much relief.  ROS: As per HPI.   OBJECTIVE:  Vitals:   08/03/17 1409  BP: 110/87  Pulse: 100  Resp: 18  Temp: 98.6 F (37 C)  TempSrc: Oral  SpO2: 99%     General appearance: alert; no distress HEENT: nasal congestion; clear runny nose; throat irritation secondary to post-nasal drainage Neck: supple without LAD Lungs: clear to auscultation bilaterally; active dry cough Skin: warm and dry Psychological: alert and cooperative; normal mood and affect   No Known Allergies  Past Medical History:  Diagnosis Date  . Depression   . Elevated liver function tests   . Gallbladder sludge   . GERD (gastroesophageal reflux disease)   . Hypercholesteremia   . Hypertension   . Obesity     Family History  Problem Relation Age of Onset  .  Hyperlipidemia Father   . Hyperlipidemia Mother   . Colon cancer Neg Hx   . Esophageal cancer Neg Hx   . Rectal cancer Neg Hx   . Stomach cancer Neg Hx     Social History   Socioeconomic History  . Marital status: Single    Spouse name: Not on file  . Number of children: Not on file  . Years of education: Not on file  . Highest education level: Not on file  Social Needs  . Financial resource strain: Not on file  . Food insecurity - worry: Not on file  . Food insecurity - inability: Not on file  . Transportation needs - medical: Not on file  . Transportation needs - non-medical: Not on file  Occupational History  . Not on file  Tobacco Use  . Smoking status: Never Smoker  . Smokeless tobacco: Never Used  Substance and Sexual Activity  . Alcohol use: Yes    Alcohol/week: 1.2 - 1.8 oz    Types: 2 - 3 Shots of liquor per week    Comment: last drink 6-7 months ago  . Drug use: No  . Sexual activity: Not on file  Other Topics Concern  . Not on file  Social History Narrative  . Not on file           Vanessa Kick, MD 08/03/17 [phone]

## 2017-08-03 NOTE — ED Triage Notes (Signed)
Pt sts cough with body aches; pt sts some N/V/D today

## 2017-08-03 NOTE — Discharge Instructions (Signed)
Be aware, your cough medication may cause drowsiness. Please do not drive, operate heavy machinery or make important decisions while on this medication, it can cloud your judgement.  

## 2017-08-17 ENCOUNTER — Other Ambulatory Visit: Payer: Self-pay | Admitting: Physician Assistant

## 2017-08-17 DIAGNOSIS — I10 Essential (primary) hypertension: Secondary | ICD-10-CM

## 2017-08-17 MED FILL — AMLODIPINE BESYLATE 5 MG TA: 5 | 30 days supply | Qty: 30 | Fill #0

## 2017-08-19 ENCOUNTER — Encounter: Payer: Self-pay | Admitting: Family Medicine

## 2017-08-19 ENCOUNTER — Ambulatory Visit: Payer: Self-pay | Attending: Family Medicine | Admitting: Family Medicine

## 2017-08-19 VITALS — BP 113/75 | HR 97 | Temp 98.0°F | Ht 65.0 in | Wt 215.6 lb

## 2017-08-19 DIAGNOSIS — R7989 Other specified abnormal findings of blood chemistry: Secondary | ICD-10-CM

## 2017-08-19 DIAGNOSIS — R945 Abnormal results of liver function studies: Secondary | ICD-10-CM

## 2017-08-19 DIAGNOSIS — Z79891 Long term (current) use of opiate analgesic: Secondary | ICD-10-CM | POA: Insufficient documentation

## 2017-08-19 DIAGNOSIS — Z79899 Other long term (current) drug therapy: Secondary | ICD-10-CM | POA: Insufficient documentation

## 2017-08-19 DIAGNOSIS — R05 Cough: Secondary | ICD-10-CM | POA: Insufficient documentation

## 2017-08-19 DIAGNOSIS — F329 Major depressive disorder, single episode, unspecified: Secondary | ICD-10-CM | POA: Insufficient documentation

## 2017-08-19 DIAGNOSIS — E669 Obesity, unspecified: Secondary | ICD-10-CM | POA: Insufficient documentation

## 2017-08-19 DIAGNOSIS — R42 Dizziness and giddiness: Secondary | ICD-10-CM | POA: Insufficient documentation

## 2017-08-19 DIAGNOSIS — R110 Nausea: Secondary | ICD-10-CM | POA: Insufficient documentation

## 2017-08-19 DIAGNOSIS — Z9889 Other specified postprocedural states: Secondary | ICD-10-CM | POA: Insufficient documentation

## 2017-08-19 DIAGNOSIS — K5900 Constipation, unspecified: Secondary | ICD-10-CM | POA: Insufficient documentation

## 2017-08-19 DIAGNOSIS — R0602 Shortness of breath: Secondary | ICD-10-CM | POA: Insufficient documentation

## 2017-08-19 DIAGNOSIS — I10 Essential (primary) hypertension: Secondary | ICD-10-CM

## 2017-08-19 DIAGNOSIS — E7800 Pure hypercholesterolemia, unspecified: Secondary | ICD-10-CM | POA: Insufficient documentation

## 2017-08-19 DIAGNOSIS — K219 Gastro-esophageal reflux disease without esophagitis: Secondary | ICD-10-CM

## 2017-08-19 MED ORDER — OMEPRAZOLE 20 MG PO CPDR: 20.0000 mg | DELAYED_RELEASE_CAPSULE | Freq: Every day | ORAL | 3 refills | Status: DC

## 2017-08-19 MED ORDER — AMLODIPINE BESYLATE 5 MG PO TABS: 5.0000 mg | ORAL_TABLET | Freq: Every day | ORAL | 5 refills | Status: DC

## 2017-08-19 MED FILL — ?OMEPRAZOLE DR 20MG CAPSULE: 20 | 30 days supply | Qty: 30 | Fill #0

## 2017-08-19 NOTE — Progress Notes (Signed)
Subjective:  [name], male    DOB: 1965/11/08  Age: 51 y.o. MRN: [id number]  CC: Cough   HPI Jesse Esparza is a 51 year old male with a history of hypertension, GERD who presents today with complaints of dizziness, nausea, bloating and epigastric discomfort which has been present over the last couple of weeks. He does have associated constipation and has a lot of gas in his stomach. This has led to reduced appetite as the nausea prevents him from eating or even taking his medications.  2 weeks ago he was seen at the ED for an influenza-like illness as he had presented with cough, wheezing, shortness of breath which was treated with an antitussive. He has felt generally unwell and has missed work a lot which has led to his losing his job.  He is followed [facility] and received a call stating he needed to make a follow-up appointment which he is yet to do.  Past Medical History:  Diagnosis Date  . Depression   . Elevated liver function tests   . Gallbladder sludge   . GERD (gastroesophageal reflux disease)   . Hypercholesteremia   . Hypertension   . Obesity     Past Surgical History:  Procedure Laterality Date  . COLONOSCOPY  2016  . left testicular  childhood  . 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 amLODipine (NORVASC) 5 MG tablet TAKE 1 TABLET BY MOUTH DAILY. 30 tablet 0  . HYDROcodone-homatropine (HYCODAN) 5-1.5 MG/5ML syrup Take 5 mLs by mouth every 6 (six) hours as needed for cough. (Patient not taking: Reported on 08/19/2017) 90 mL 0  . ondansetron (ZOFRAN) 8 MG tablet Take 1 tablet (8 mg total) by mouth every 8 (eight) hours as needed for nausea or vomiting. (Patient not taking: Reported on 08/19/2017) 20 tablet 0   No facility-administered medications prior to visit.     ROS Review of Systems  Constitutional: Positive for  appetite change. Negative for activity change.  HENT: Negative for sinus pressure and sore throat.   Eyes: Negative for visual disturbance.  Respiratory: Positive for cough. Negative for chest tightness and shortness of breath.   Cardiovascular: Negative for chest pain and leg swelling.  Gastrointestinal: Positive for abdominal pain and nausea. Negative for abdominal distention, constipation and diarrhea.  Endocrine: Negative.   Genitourinary: Negative for dysuria.  Musculoskeletal: Negative for joint swelling and myalgias.  Skin: Negative for rash.  Allergic/Immunologic: Negative.   Neurological: Positive for light-headedness. Negative for weakness and numbness.  Psychiatric/Behavioral: Negative for dysphoric mood and suicidal ideas.    Objective:  BP 113/75   Pulse 97   Temp 98 F (36.7 C) (Oral)   Ht _0  (1.651 m)   Wt 215 lb 9.6 oz (97.8 kg)   SpO2 97%   BMI 35.88 kg/m   BP/Weight 08/19/2017 08/03/2017 2/37/6283  Systolic BP 151 761 607  Diastolic BP 75 87 87  Wt. (Lbs) 215.6 - -  BMI 35.88 - -      Physical Exam  Constitutional: He is oriented to person, place, and time. He appears well-developed and well-nourished.  Cardiovascular: Normal rate, normal heart sounds and intact distal pulses.  No murmur heard. Pulmonary/Chest: Effort normal and breath sounds normal. He has no wheezes. He has no rales. He exhibits no tenderness.  Abdominal: Soft. Bowel sounds are normal. He exhibits no distension  and no mass. There is tenderness (slight epigastric tenderness).  Musculoskeletal: Normal range of motion.  Neurological: He is alert and oriented to person, place, and time.  Skin: Skin is warm and dry.  Psychiatric: He has a normal mood and affect.     Assessment & Plan:   1. Essential hypertension, benign Controlled Low-sodium diet - amLODipine (NORVASC) 5 MG tablet; Take 1 tablet (5 mg total) by mouth daily.  Dispense: 30 tablet; Refill: 5 - CMP14+EGFR  2.  Gastroesophageal reflux disease without esophagitis Uncontrolled He does have epigastric tenderness which could be suspicious for peptic ulcer disease We will send of Helicobacter pylori breath test and placed on PPI Advised to keep appointment with GI - H. pylori breath test - CBC with Differential/Platelet   Meds ordered this encounter  Medications  . amLODipine (NORVASC) 5 MG tablet    Sig: Take 1 tablet (5 mg total) by mouth daily.    Dispense:  30 tablet    Refill:  5  . omeprazole (PRILOSEC) 20 MG capsule    Sig: Take 1 capsule (20 mg total) by mouth daily.    Dispense:  30 capsule    Refill:  3    Follow-up: Return in about 1 month (around 09/19/2017) for follow up of GERD.   Arnoldo Morale MD

## 2017-08-19 NOTE — Patient Instructions (Signed)

## 2017-08-20 LAB — CMP14+EGFR
ALBUMIN: 4.1 g/dL (ref 3.5–5.5)
ALT: 70 IU/L — ABNORMAL HIGH (ref 0–44)
AST: 120 IU/L — ABNORMAL HIGH (ref 0–40)
Albumin/Globulin Ratio: 1.2 (ref 1.2–2.2)
Alkaline Phosphatase: 141 IU/L — ABNORMAL HIGH (ref 39–117)
BILIRUBIN TOTAL: 2.1 mg/dL — AB (ref 0.0–1.2)
BUN / CREAT RATIO: 14 (ref 9–20)
BUN: 12 mg/dL (ref 6–24)
CALCIUM: 9.2 mg/dL (ref 8.7–10.2)
CO2: 22 mmol/L (ref 20–29)
CREATININE: 0.86 mg/dL (ref 0.76–1.27)
Chloride: 99 mmol/L (ref 96–106)
GFR, EST AFRICAN AMERICAN: 114 mL/min/{1.73_m2} (ref >59)
GFR, EST NON AFRICAN AMERICAN: 99 mL/min/{1.73_m2} (ref >59)
GLUCOSE: 79 mg/dL (ref 65–99)
Globulin, Total: 3.5 g/dL (ref 1.5–4.5)
Potassium: 3.7 mmol/L (ref 3.5–5.2)
Sodium: 138 mmol/L (ref 134–144)
TOTAL PROTEIN: 7.6 g/dL (ref 6.0–8.5)

## 2017-08-20 LAB — CBC WITH DIFFERENTIAL/PLATELET
Basophils Absolute: 0 10*3/uL (ref 0.0–0.2)
Basos: 0 %
EOS (ABSOLUTE): 0.1 10*3/uL (ref 0.0–0.4)
EOS: 1 %
HEMATOCRIT: 40.1 % (ref 37.5–51.0)
HEMOGLOBIN: 13.8 g/dL (ref 13.0–17.7)
Immature Grans (Abs): 0 10*3/uL (ref 0.0–0.1)
Immature Granulocytes: 0 %
LYMPHS ABS: 1 10*3/uL (ref 0.7–3.1)
Lymphs: 9 %
MCH: 33.5 pg — AB (ref 26.6–33.0)
MCHC: 34.4 g/dL (ref 31.5–35.7)
MCV: 97 fL (ref 79–97)
MONOCYTES: 8 %
Monocytes Absolute: 0.9 10*3/uL (ref 0.1–0.9)
NEUTROS ABS: 9 10*3/uL — AB (ref 1.4–7.0)
Neutrophils: 82 %
Platelets: 131 10*3/uL — ABNORMAL LOW (ref 150–379)
RBC: 4.12 x10E6/uL — ABNORMAL LOW (ref 4.14–5.80)
RDW: 14.8 % (ref 12.3–15.4)
WBC: 11.1 10*3/uL — ABNORMAL HIGH (ref 3.4–10.8)

## 2017-08-22 LAB — H. PYLORI BREATH TEST: H pylori Breath Test: NEGATIVE

## 2017-08-25 ENCOUNTER — Ambulatory Visit: Payer: Self-pay | Attending: Family Medicine | Admitting: Pharmacist

## 2017-08-25 ENCOUNTER — Other Ambulatory Visit: Payer: Self-pay | Admitting: Family Medicine

## 2017-08-25 VITALS — BP 111/76

## 2017-08-25 DIAGNOSIS — R7989 Other specified abnormal findings of blood chemistry: Secondary | ICD-10-CM

## 2017-08-25 DIAGNOSIS — R945 Abnormal results of liver function studies: Principal | ICD-10-CM

## 2017-08-25 DIAGNOSIS — I10 Essential (primary) hypertension: Secondary | ICD-10-CM | POA: Insufficient documentation

## 2017-08-25 NOTE — Addendum Note (Signed)
Addended by: Octaviano Glow on: 08/25/2017 09:18 AM   Modules accepted: Orders

## 2017-08-25 NOTE — Progress Notes (Signed)
   S:    Patient arrives in good spirits asking to speak with someone about the side effects of his medication, amlodipine.   Patient denies adherence with medications. He stopped the amlodipine 1-2 weeks ago due to side effects  Current BP Medications include:  none  He reports that the amlodipine caused all of the side effects that he is having: constipation, GERD, headache. He reports that the side effects started some time after he started the amlodipine and he still has the side effects even though they are better despite stopping amlodipine 1-2 weeks ago.  He also requests his labs.   O:   Last 3 Office BP readings: BP Readings from Last 3 Encounters:  08/25/17 111/76  08/19/17 113/75  08/03/17 110/87    BMET    Component Value Date/Time   NA 138 08/19/2017 1526   K 3.7 08/19/2017 1526   CL 99 08/19/2017 1526   CO2 22 08/19/2017 1526   GLUCOSE 79 08/19/2017 1526   GLUCOSE 131 (H) 11/30/2016 1520   BUN 12 08/19/2017 1526   CREATININE 0.86 08/19/2017 1526   CREATININE 0.79 05/04/2016 1638   CALCIUM 9.2 08/19/2017 1526   GFRNONAA 99 08/19/2017 1526   GFRNONAA >89 05/04/2016 1638   GFRAA 114 08/19/2017 1526   GFRAA >89 05/04/2016 1638    A/P: Hypertension longstanding currently controlled off of medication. Discussed that it is unlikely the medication that caused his symptoms since they are still present despite stopping the medication 1-2 weeks ago. Patient is adamant that it is the amlodipine and he will not restart it so will remove from medication list. Blood pressure normal today.   Reviewed labs with patient. Encouraged him to make a follow up appt with Dr. Jarold Song and GI as he was directed at his last visit with PCP.  Patient verbalized understanding.  Results reviewed and written information provided.   Total time in face-to-face counseling 10 minutes.   F/U Clinic Visit with Dr. Jarold Song.

## 2017-08-25 NOTE — Patient Instructions (Addendum)
CMP     Component Value Date/Time   NA 138 08/19/2017 1526   K 3.7 08/19/2017 1526   CL 99 08/19/2017 1526   CO2 22 08/19/2017 1526   GLUCOSE 79 08/19/2017 1526   GLUCOSE 131 (H) 11/30/2016 1520   BUN 12 08/19/2017 1526   CREATININE 0.86 08/19/2017 1526   CREATININE 0.79 05/04/2016 1638   CALCIUM 9.2 08/19/2017 1526   PROT 7.6 08/19/2017 1526   ALBUMIN 4.1 08/19/2017 1526   AST 120 (H) 08/19/2017 1526   ALT 70 (H) 08/19/2017 1526   ALKPHOS 141 (H) 08/19/2017 1526   BILITOT 2.1 (H) 08/19/2017 1526   GFRNONAA 99 08/19/2017 1526   GFRNONAA >89 05/04/2016 1638   GFRAA 114 08/19/2017 1526   GFRAA >89 05/04/2016 1638   Lab Results  Component Value Date   WBC 11.1 (H) 08/19/2017   HGB 13.8 08/19/2017   HCT 40.1 08/19/2017   MCV 97 08/19/2017   PLT 131 (L) 08/19/2017    H. Pylori was negative  I have removed amlodipine from your medication list   Make an appointment with Dr. Jarold Song for follow up. Also make an appointment with GI.

## 2017-08-26 LAB — ACUTE HEP PANEL AND HEP B SURFACE AB
HEP A IGM: NEGATIVE
HEPATITIS B SURF AB QUANT: 47.8 m[IU]/mL
Hep B C IgM: NEGATIVE
Hepatitis B Surface Ag: NEGATIVE

## 2017-08-26 LAB — SPECIMEN STATUS REPORT

## 2017-09-13 ENCOUNTER — Ambulatory Visit: Payer: Self-pay | Admitting: Family Medicine

## 2017-09-29 ENCOUNTER — Ambulatory Visit (HOSPITAL_COMMUNITY)
Admission: EM | Admit: 2017-09-29 | Discharge: 2017-09-29 | Disposition: A | Discharge status: Home / Self Care | Payer: Self-pay | Attending: Family Medicine | Admitting: Family Medicine

## 2017-09-29 ENCOUNTER — Encounter (HOSPITAL_COMMUNITY): Payer: Self-pay | Admitting: Family Medicine

## 2017-09-29 DIAGNOSIS — R42 Dizziness and giddiness: Secondary | ICD-10-CM | POA: Insufficient documentation

## 2017-09-29 DIAGNOSIS — R1013 Epigastric pain: Secondary | ICD-10-CM | POA: Insufficient documentation

## 2017-09-29 DIAGNOSIS — R1110 Vomiting, unspecified: Secondary | ICD-10-CM | POA: Insufficient documentation

## 2017-09-29 LAB — POCT I-STAT, CHEM 8
BUN: 8 mg/dL (ref 6–20)
CALCIUM ION: 1 mmol/L — AB (ref 1.15–1.40)
Chloride: 95 mmol/L — ABNORMAL LOW (ref 101–111)
Creatinine, Ser: 0.8 mg/dL (ref 0.61–1.24)
GLUCOSE: 124 mg/dL — AB (ref 65–99)
HCT: 48 % (ref 39.0–52.0)
HEMOGLOBIN: 16.3 g/dL (ref 13.0–17.0)
Potassium: 3.6 mmol/L (ref 3.5–5.1)
SODIUM: 137 mmol/L (ref 135–145)
TCO2: 26 mmol/L (ref 22–32)

## 2017-09-29 LAB — CBC WITH DIFFERENTIAL/PLATELET
Basophils Absolute: 0 10*3/uL (ref 0.0–0.1)
Basophils Relative: 0 %
Eosinophils Absolute: 0 10*3/uL (ref 0.0–0.7)
Eosinophils Relative: 0 %
HCT: 45.1 % (ref 39.0–52.0)
Hemoglobin: 15.3 g/dL (ref 13.0–17.0)
Lymphocytes Relative: 9 %
Lymphs Abs: 0.7 10*3/uL (ref 0.7–4.0)
MCH: 33.8 pg (ref 26.0–34.0)
MCHC: 33.9 g/dL (ref 30.0–36.0)
MCV: 99.6 fL (ref 78.0–100.0)
Monocytes Absolute: 0.6 10*3/uL (ref 0.1–1.0)
Monocytes Relative: 8 %
Neutro Abs: 6.1 10*3/uL (ref 1.7–7.7)
Neutrophils Relative %: 83 %
Platelets: 66 10*3/uL — ABNORMAL LOW (ref 150–400)
RBC: 4.53 MIL/uL (ref 4.22–5.81)
RDW: 13.8 % (ref 11.5–15.5)
WBC: 7.3 10*3/uL (ref 4.0–10.5)

## 2017-09-29 LAB — LIPASE, BLOOD: Lipase: 85 U/L — ABNORMAL HIGH (ref 11–51)

## 2017-09-29 LAB — AMYLASE: Amylase: 77 U/L (ref 28–100)

## 2017-09-29 MED ORDER — ONDANSETRON 8 MG PO TBDP: 8.0000 mg | ORAL_TABLET | Freq: Three times a day (TID) | ORAL | 0 refills | Status: DC | PRN

## 2017-09-29 MED ORDER — SODIUM CHLORIDE 0.9 % IV SOLN
Freq: Once | INTRAVENOUS | Status: AC
  Administered 2017-09-29: 1000 mL/h via INTRAVENOUS

## 2017-09-29 MED ORDER — ONDANSETRON HCL 4 MG/2ML IJ SOLN
4.0000 mg | Freq: Once | INTRAMUSCULAR | Status: AC
  Administered 2017-09-29: 4 mg via INTRAMUSCULAR

## 2017-09-29 MED ORDER — ONDANSETRON HCL 4 MG/2ML IJ SOLN
INTRAMUSCULAR | Status: AC
  Filled 2017-09-29: qty 2

## 2017-09-29 MED ORDER — ONDANSETRON HCL 4 MG/2ML IJ SOLN: 4.0000 mg | Freq: Once | INTRAMUSCULAR | Status: DC

## 2017-09-29 MED FILL — ONDANSETRON ODT 8 MG TABLET: 8 | 4 days supply | Qty: 12 | Fill #0

## 2017-09-29 NOTE — Discharge Instructions (Signed)
You need to follow-up with your gastroenterologist in the next week.  If your symptoms worsen, you will need to go to the emergency room.  For the next day or 2, stay on liquids and avoid all alcohol and any greasy food.  I suspect this is related to drinking the fifth of alcohol last week and that you have pancreatitis.  This should gradually resolve if you stick to liquids for several days with toast tomorrow.  Do not drink any more alcohol or this will come back   Dx:  Gastroesophageal reflux disease witho...   Ref Range & Units 65moago  H pylori Breath Test Negative Negative   Resulting Agency  [facility]    Narrative  Performed by: [facility]  Performed at:  [address] [place] [id number] Director: SRush FarmerMD, Phone:  [phone]   Specimen Collected: 08/19/17 15:27  Last Resulted: 08/22/17 13:36     Lab Flowsheet    Order Details    View Encounter    Lab and Collection Details    Routing    Result History           Other Results from 08/19/2017   Contains abnormal data CMP14+EGFR  Order: [id number]  Status:  Final result  Visible to patient:  Yes (MyChart)  Next appt:  None  Dx:  Essential hypertension, benign  Important Suggestion Newer results are available. Click to view them now.   Ref Range & Units 126mogo  Glucose 65 - 99 mg/dL 79   BUN 6 - 24 mg/dL 12   Creatinine, Ser 0.76 - 1.27 mg/dL 0.86   GFR calc non Af Amer >59 mL/min/1.73 99   GFR calc Af Amer >59 mL/min/1.73 114   BUN/Creatinine Ratio 9 - 20 14   Sodium 134 - 144 mmol/L 138   Potassium 3.5 - 5.2 mmol/L 3.7   Chloride 96 - 106 mmol/L 99   CO2 20 - 29 mmol/L 22   Calcium 8.7 - 10.2 mg/dL 9.2   Total Protein 6.0 - 8.5 g/dL 7.6   Albumin 3.5 - 5.5 g/dL 4.1   Globulin, Total 1.5 - 4.5 g/dL 3.5   Albumin/Globulin Ratio 1.2 - 2.2 1.2   Bilirubin Total 0.0 - 1.2 mg/dL 2.1 Abnormally high    Alkaline Phosphatase 39 - 117 IU/L 141 Abnormally high    AST 0  - 40 IU/L 120 Abnormally high    ALT 0 - 44 IU/L 70 Abnormally high    Resulting Agency  [facility]

## 2017-09-29 NOTE — ED Provider Notes (Signed)
[place]   [id number] 09/29/17 Arrival Time: 1000   SUBJECTIVE:  Jesse Esparza is a 52 y.o. male who presents to the urgent care with complaint of vomiting and dizziness.  Patient started vomiting at 3 AM today.  He is thinking that this problem is coming from H. pylori infection.  He had this tested last month, however, and it was negative.  He had an episode of similar vomiting last month at which time his liver functions were elevated.  Patient admits to having drunk 1/5 of whiskey last week.  Patient complains of shaking hands, thirst, and epigastric distress.  He is not having any vomiting and has had no syncope.  He does have some lightheadedness when standing.   Past Medical History:  Diagnosis Date  . Depression   . Elevated liver function tests   . Gallbladder sludge   . GERD (gastroesophageal reflux disease)   . Hypercholesteremia   . Hypertension   . Obesity    Family History  Problem Relation Age of Onset  . Hyperlipidemia Father   . Hyperlipidemia Mother   . Colon cancer Neg Hx   . Esophageal cancer Neg Hx   . Rectal cancer Neg Hx   . Stomach cancer Neg Hx    Social History   Socioeconomic History  . Marital status: Single    Spouse name: Not on file  . Number of children: Not on file  . Years of education: Not on file  . Highest education level: Not on file  Social Needs  . Financial resource strain: Not on file  . Food insecurity - worry: Not on file  . Food insecurity - inability: Not on file  . Transportation needs - medical: Not on file  . Transportation needs - non-medical: Not on file  Occupational History  . Not on file  Tobacco Use  . Smoking status: Never Smoker  . Smokeless tobacco: Never Used  Substance and Sexual Activity  . Alcohol use: Yes    Alcohol/week: 1.2 - 1.8 oz    Types: 2 - 3 Shots of liquor per week    Comment: last drink 6-7 months ago  . Drug use: No  . Sexual activity: Not on file  Other Topics  Concern  . Not on file  Social History Narrative  . Not on file   No outpatient medications have been marked as taking for the 09/29/17 encounter [facility] Encounter).   No Known Allergies    ROS: As per HPI, remainder of ROS negative.   OBJECTIVE:   Vitals:   09/29/17 1019  BP: (!) 132/100  Pulse: (!) 123  Resp: 20  Temp: 99 F (37.2 C)  SpO2: 98%     General appearance: alert; no distress Eyes: PERRL; EOMI; conjunctiva normal without icterus HENT: normocephalic; atraumatic;  external ears normal without trauma; nasal mucosa normal; oral shows horrible dentition Neck: supple Lungs: clear to auscultation bilaterally Heart: regular rate and rhythm; tachycardic Abdomen: soft, tender epigastrium, hyperactive bowel sounds, fullness in the right upper quadrant Back: no CVA tenderness Extremities: no cyanosis or edema; symmetrical with no gross deformities Skin: warm and dry Neurologic: normal gait; grossly normal Psychological: alert and cooperative; normal mood and affect   Labs:  Results for orders placed or performed during the hospital encounter of 09/29/17  I-STAT, chem 8  Result Value Ref Range   Sodium 137 135 - 145 mmol/L   Potassium 3.6 3.5 - 5.1 mmol/L   Chloride 95 (L) 101 -  111 mmol/L   BUN 8 6 - 20 mg/dL   Creatinine, Ser 0.80 0.61 - 1.24 mg/dL   Glucose, Bld 124 (H) 65 - 99 mg/dL   Calcium, Ion 1.00 (L) 1.15 - 1.40 mmol/L   TCO2 26 22 - 32 mmol/L   Hemoglobin 16.3 13.0 - 17.0 g/dL   HCT 48.0 39.0 - 52.0 %    Labs Reviewed  POCT I-STAT, CHEM 8 - Abnormal; Notable for the following components:      Result Value   Chloride 95 (*)    Glucose, Bld 124 (*)    Calcium, Ion 1.00 (*)    All other components within normal limits  AMYLASE  LIPASE, BLOOD  CBC WITH DIFFERENTIAL/PLATELET    After 1 L of IV fluids, patient felt much better and his abdominal pain was significantly decreased although he still had some epigastric discomfort.   ASSESSMENT  & PLAN:  No diagnosis found.  Meds ordered this encounter  Medications  . ondansetron (ZOFRAN) injection 4 mg  . 0.9 %  sodium chloride infusion  . DISCONTD: ondansetron (ZOFRAN) injection 4 mg  . ondansetron (ZOFRAN-ODT) 8 MG disintegrating tablet    Sig: Take 1 tablet (8 mg total) by mouth every 8 (eight) hours as needed for nausea.    Dispense:  12 tablet    Refill:  0    Reviewed expectations re: course of current medical issues. Questions answered. Outlined signs and symptoms indicating need for more acute intervention. Patient verbalized understanding. After Visit Summary given.  You need to follow-up with your gastroenterologist in the next week.  If your symptoms worsen, you will need to go to the emergency room.  For the next       Robyn Haber, MD 09/29/17 1202

## 2017-09-29 NOTE — ED Notes (Signed)
Pt given ginger ale and tolerated okay.

## 2017-09-29 NOTE — ED Triage Notes (Signed)
Pt here for dizziness, vomiting since 3 am. sts fever.

## 2017-09-30 ENCOUNTER — Telehealth: Payer: Self-pay | Admitting: Family Medicine

## 2017-09-30 NOTE — Telephone Encounter (Signed)
Pt. Called stating he went to urgent care yesterday and was told that his platelets were 60. Pt. States that he does not know what to do. Pt. Wanted to know if he could speak with PCP so that she can tell him what he can do. Pt. Has scheduled an appt for 10/10/17. Please f/u

## 2017-10-03 NOTE — Telephone Encounter (Signed)
We will discuss this further at his upcoming office visit.  Meanwhile I would like him to schedule an appointment with his gastroenterologist which he had promised to do at his last visit with me but is yet to.

## 2017-10-03 NOTE — Telephone Encounter (Signed)
noted 

## 2017-10-10 ENCOUNTER — Encounter: Payer: Self-pay | Admitting: Family Medicine

## 2017-10-10 ENCOUNTER — Ambulatory Visit: Payer: Self-pay | Attending: Family Medicine | Admitting: Family Medicine

## 2017-10-10 VITALS — BP 102/66 | HR 71 | Temp 98.1°F | Ht 65.0 in | Wt 216.0 lb

## 2017-10-10 DIAGNOSIS — E279 Disorder of adrenal gland, unspecified: Secondary | ICD-10-CM | POA: Insufficient documentation

## 2017-10-10 DIAGNOSIS — I10 Essential (primary) hypertension: Secondary | ICD-10-CM | POA: Insufficient documentation

## 2017-10-10 DIAGNOSIS — D696 Thrombocytopenia, unspecified: Secondary | ICD-10-CM | POA: Insufficient documentation

## 2017-10-10 DIAGNOSIS — F329 Major depressive disorder, single episode, unspecified: Secondary | ICD-10-CM | POA: Insufficient documentation

## 2017-10-10 DIAGNOSIS — Z0189 Encounter for other specified special examinations: Secondary | ICD-10-CM | POA: Insufficient documentation

## 2017-10-10 DIAGNOSIS — Z79899 Other long term (current) drug therapy: Secondary | ICD-10-CM | POA: Insufficient documentation

## 2017-10-10 DIAGNOSIS — E669 Obesity, unspecified: Secondary | ICD-10-CM | POA: Insufficient documentation

## 2017-10-10 DIAGNOSIS — E7800 Pure hypercholesterolemia, unspecified: Secondary | ICD-10-CM | POA: Insufficient documentation

## 2017-10-10 DIAGNOSIS — E278 Other specified disorders of adrenal gland: Secondary | ICD-10-CM

## 2017-10-10 DIAGNOSIS — K219 Gastro-esophageal reflux disease without esophagitis: Secondary | ICD-10-CM | POA: Insufficient documentation

## 2017-10-10 DIAGNOSIS — R112 Nausea with vomiting, unspecified: Secondary | ICD-10-CM | POA: Insufficient documentation

## 2017-10-10 DIAGNOSIS — R17 Unspecified jaundice: Secondary | ICD-10-CM | POA: Insufficient documentation

## 2017-10-11 ENCOUNTER — Encounter: Payer: Self-pay | Admitting: Gastroenterology

## 2017-10-11 ENCOUNTER — Encounter: Payer: Self-pay | Admitting: Family Medicine

## 2017-10-11 LAB — CMP14+EGFR
A/G RATIO: 1.4 (ref 1.2–2.2)
ALT: 98 IU/L — ABNORMAL HIGH (ref 0–44)
AST: 101 IU/L — AB (ref 0–40)
Albumin: 4.2 g/dL (ref 3.5–5.5)
Alkaline Phosphatase: 107 IU/L (ref 39–117)
BUN/Creatinine Ratio: 14 (ref 9–20)
BUN: 11 mg/dL (ref 6–24)
Bilirubin Total: 0.8 mg/dL (ref 0.0–1.2)
CALCIUM: 9 mg/dL (ref 8.7–10.2)
CHLORIDE: 109 mmol/L — AB (ref 96–106)
CO2: 23 mmol/L (ref 20–29)
Creatinine, Ser: 0.76 mg/dL (ref 0.76–1.27)
GFR, EST AFRICAN AMERICAN: 120 mL/min/{1.73_m2} (ref >59)
GFR, EST NON AFRICAN AMERICAN: 104 mL/min/{1.73_m2} (ref >59)
GLUCOSE: 93 mg/dL (ref 65–99)
Globulin, Total: 3.1 g/dL (ref 1.5–4.5)
POTASSIUM: 4.5 mmol/L (ref 3.5–5.2)
Sodium: 145 mmol/L — ABNORMAL HIGH (ref 134–144)
TOTAL PROTEIN: 7.3 g/dL (ref 6.0–8.5)

## 2017-10-11 LAB — CBC WITH DIFFERENTIAL/PLATELET
BASOS ABS: 0.1 10*3/uL (ref 0.0–0.2)
BASOS: 1 %
EOS (ABSOLUTE): 0.1 10*3/uL (ref 0.0–0.4)
Eos: 1 %
HEMOGLOBIN: 13.3 g/dL (ref 13.0–17.7)
Hematocrit: 40.3 % (ref 37.5–51.0)
IMMATURE GRANULOCYTES: 0 %
Immature Grans (Abs): 0 10*3/uL (ref 0.0–0.1)
Lymphocytes Absolute: 1.3 10*3/uL (ref 0.7–3.1)
Lymphs: 17 %
MCH: 33.3 pg — ABNORMAL HIGH (ref 26.6–33.0)
MCHC: 33 g/dL (ref 31.5–35.7)
MCV: 101 fL — ABNORMAL HIGH (ref 79–97)
MONOCYTES: 10 %
Monocytes Absolute: 0.8 10*3/uL (ref 0.1–0.9)
NEUTROS ABS: 5.7 10*3/uL (ref 1.4–7.0)
NEUTROS PCT: 71 %
PLATELETS: 148 10*3/uL — AB (ref 150–379)
RBC: 4 x10E6/uL — ABNORMAL LOW (ref 4.14–5.80)
RDW: 14.3 % (ref 12.3–15.4)
WBC: 7.9 10*3/uL (ref 3.4–10.8)

## 2017-10-11 LAB — VITAMIN D 25 HYDROXY (VIT D DEFICIENCY, FRACTURES): VIT D 25 HYDROXY: 14 ng/mL — AB (ref 30.0–100.0)

## 2017-10-11 LAB — AMYLASE: AMYLASE: 121 U/L (ref 31–124)

## 2017-10-11 LAB — LIPASE: LIPASE: 178 U/L — AB (ref 13–78)

## 2017-10-11 MED ORDER — ERGOCALCIFEROL 1.25 MG (50000 UT) PO CAPS: 50000.0000 [IU] | ORAL_CAPSULE | ORAL | 1 refills | Status: DC

## 2017-10-11 MED FILL — VIT D2 1.25 MG (50,000 UNIT: 1.25 MG | 28 days supply | Qty: 4 | Fill #0

## 2017-10-11 NOTE — Progress Notes (Signed)
 Subjective:  [name], male    DOB: 10/24/1965  Age: 51 y.o. MRN: [id number]  CC: Hospitalization Follow-up   HPI Jesse Esparza is a 51-year-old male with a history of hypertension, GERD who presents today for a follow up of thrombocytopenia of 66 which was noticed on labs he had at Urgent care on 09/29/17 after he had presented with nausea and vomiting and a lipase at the time was elevated at 85.  He is asymptomatic today but would like the thrombocytopenia further evaluated. He does have have a history of elevated LFTS including bilirubin of 2.1 for which he was seen by GI in the recent past.  MRI of the abdomen reviewed from 05/2016 revealed: IMPRESSION: 1. 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 Recommend clinical and laboratory evaluation for pheochromocytoma. MRI findings are not consistent with an adrenal adenoma. Follow-up CT abdomen with IV contrast or MRI of the abdomen without and with IV contrast is recommended in 6 months if this nodule is not resected. This recommendation follows ACR consensus guidelines: Management of Incidental Adrenal Masses: A White Paper of the ACR Incidental Findings Committee. J Am Coll Radiol 2017;14:1038-1044. 2. Heterogeneous enhancement of the liver parenchyma, without discrete liver mass. Findings suggest nonspecific hepatocellular disease. No evidence of hepatic steatosis on MRI. No definite liver surface irregularity to suggest cirrhosis. 3. Mild splenomegaly, stable.  He is requesting a vitamin D level today as he has had vitamin D deficiency in the past.  Past Medical History:  Diagnosis Date  . Depression   . Elevated liver function tests   . Gallbladder sludge   . GERD (gastroesophageal reflux disease)   . Hypercholesteremia   . Hypertension   . Obesity     Past Surgical History:  Procedure Laterality Date  . COLONOSCOPY  2016  . left  testicular  childhood  . 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 omeprazole (PRILOSEC) 20 MG capsule Take 1 capsule (20 mg total) by mouth daily. 30 capsule 3  . ondansetron (ZOFRAN-ODT) 8 MG disintegrating tablet Take 1 tablet (8 mg total) by mouth every 8 (eight) hours as needed for nausea. 12 tablet 0  . HYDROcodone-homatropine (HYCODAN) 5-1.5 MG/5ML syrup Take 5 mLs by mouth every 6 (six) hours as needed for cough. (Patient not taking: Reported on 08/19/2017) 90 mL 0  . ondansetron (ZOFRAN) 8 MG tablet Take 1 tablet (8 mg total) by mouth every 8 (eight) hours as needed for nausea or vomiting. (Patient not taking: Reported on 08/19/2017) 20 tablet 0   No facility-administered medications prior to visit.     ROS Review of Systems  Constitutional: Negative for activity change and appetite change.  HENT: Negative for sinus pressure and sore throat.   Eyes: Negative for visual disturbance.  Respiratory: Negative for cough, chest tightness and shortness of breath.   Cardiovascular: Negative for chest pain and leg swelling.  Gastrointestinal: Negative for abdominal distention, abdominal pain, constipation and diarrhea.  Endocrine: Negative.   Genitourinary: Negative for dysuria.  Musculoskeletal: Negative for joint swelling and myalgias.  Skin: Negative for rash.  Allergic/Immunologic: Negative.   Neurological: Negative for weakness, light-headedness and numbness.  Psychiatric/Behavioral: Negative for dysphoric mood and suicidal ideas.    Objective:  BP 102/66   Pulse 71   Temp 98.1 F (36.7 C) (Oral)   Ht 5'   5" (1.651 m)   Wt 216 lb (98 kg)   SpO2 99%   BMI 35.94 kg/m   BP/Weight 10/10/2017 09/29/2017 08/25/2017  Systolic BP 102 132 111  Diastolic BP 66 100 76  Wt. (Lbs) 216 - -  BMI 35.94 - -      Physical Exam  Constitutional: He is oriented to person, place, and time. He  appears well-developed and well-nourished.  Cardiovascular: Normal rate, normal heart sounds and intact distal pulses.  No murmur heard. Pulmonary/Chest: Effort normal and breath sounds normal. He has no wheezes. He has no rales. He exhibits no tenderness.  Abdominal: Soft. Bowel sounds are normal. He exhibits no distension and no mass. There is no tenderness.  Musculoskeletal: Normal range of motion.  Neurological: He is alert and oriented to person, place, and time.  Skin: Skin is warm and dry.  Psychiatric: He has a normal mood and affect.     Assessment & Plan:   1. Thrombocytopenia (HCC) Will repeat CBC - CBC with Differential/Platelet - VITAMIN D 25 Hydroxy (Vit-D Deficiency, Fractures)  2. Hyperbilirubinemia Last bilirubin was 2.1, he does not appear icteric today - CMP14+EGFR - Lipase - Amylase  3. Adrenal nodule (HCC) Adrenal nodule suspicious for phaeochromocytoma in 2017 - MR Abdomen W Wo Contrast; Future   No orders of the defined types were placed in this encounter.   Follow-up: Return in about 1 month (around 11/07/2017) for Follow-up of thrombocytopenia.   Enobong Newlin MD   

## 2017-10-19 ENCOUNTER — Ambulatory Visit (HOSPITAL_COMMUNITY)
Admission: RE | Admit: 2017-10-19 | Discharge: 2017-10-19 | Disposition: A | Discharge status: Home / Self Care | Payer: Self-pay | Source: Ambulatory Visit | Attending: Family Medicine | Admitting: Family Medicine

## 2017-10-19 DIAGNOSIS — E279 Disorder of adrenal gland, unspecified: Secondary | ICD-10-CM | POA: Insufficient documentation

## 2017-10-19 DIAGNOSIS — K769 Liver disease, unspecified: Secondary | ICD-10-CM | POA: Insufficient documentation

## 2017-10-19 DIAGNOSIS — E278 Other specified disorders of adrenal gland: Secondary | ICD-10-CM

## 2017-10-19 DIAGNOSIS — K8020 Calculus of gallbladder without cholecystitis without obstruction: Secondary | ICD-10-CM | POA: Insufficient documentation

## 2017-10-19 MED ORDER — GADOBENATE DIMEGLUMINE 529 MG/ML IV SOLN
20.0000 mL | Freq: Once | INTRAVENOUS | Status: AC | PRN
  Administered 2017-10-19: 20 mL via INTRAVENOUS

## 2017-10-20 ENCOUNTER — Other Ambulatory Visit: Payer: Self-pay | Admitting: Family Medicine

## 2017-10-20 DIAGNOSIS — R748 Abnormal levels of other serum enzymes: Secondary | ICD-10-CM

## 2017-10-31 ENCOUNTER — Telehealth: Payer: Self-pay

## 2017-10-31 ENCOUNTER — Other Ambulatory Visit: Payer: Self-pay | Admitting: Family Medicine

## 2017-10-31 MED FILL — VIT D2 1.25 MG (50,000 UNIT: 1.25 MG | 28 days supply | Qty: 4 | Fill #1

## 2017-10-31 MED FILL — OMEPRAZOLE DR 20 MG CAPSULE: 20 | 30 days supply | Qty: 30 | Fill #1

## 2017-10-31 NOTE — Telephone Encounter (Signed)
Patient was called and informed of lab results. 

## 2017-11-07 ENCOUNTER — Encounter: Payer: Self-pay | Admitting: Family Medicine

## 2017-11-07 ENCOUNTER — Other Ambulatory Visit: Payer: Self-pay

## 2017-11-07 ENCOUNTER — Ambulatory Visit: Payer: Self-pay | Attending: Family Medicine | Admitting: Family Medicine

## 2017-11-07 VITALS — BP 111/77 | HR 83 | Temp 98.7°F | Resp 20 | Wt 219.0 lb

## 2017-11-07 DIAGNOSIS — K219 Gastro-esophageal reflux disease without esophagitis: Secondary | ICD-10-CM | POA: Insufficient documentation

## 2017-11-07 DIAGNOSIS — I10 Essential (primary) hypertension: Secondary | ICD-10-CM | POA: Insufficient documentation

## 2017-11-07 DIAGNOSIS — R748 Abnormal levels of other serum enzymes: Secondary | ICD-10-CM

## 2017-11-07 DIAGNOSIS — Z79899 Other long term (current) drug therapy: Secondary | ICD-10-CM | POA: Insufficient documentation

## 2017-11-07 DIAGNOSIS — H1013 Acute atopic conjunctivitis, bilateral: Secondary | ICD-10-CM

## 2017-11-07 DIAGNOSIS — E7800 Pure hypercholesterolemia, unspecified: Secondary | ICD-10-CM | POA: Insufficient documentation

## 2017-11-07 NOTE — Progress Notes (Signed)
Discuss labs/ results/ upcoming exam

## 2017-11-07 NOTE — Patient Instructions (Signed)
Allergic Conjunctivitis A clear membrane (conjunctiva) covers the white part of your eye and the inner surface of your eyelid. Allergic conjunctivitis happens when this membrane has inflammation. This is caused by allergies. Common causes of allergic reactions (allergens)include:  Outdoor allergens, such as:  Pollen.  Grass and weeds.  Mold spores.  Indoor allergens, such as:  Dust.  Smoke.  Mold.  Pet dander.  Animal hair. This condition can make your eye red or pink. It can also make your eye feel itchy. This condition cannot be spread from one person to another person (is not contagious). Follow these instructions at home:  Try not to be around things that you are allergic to.  Take or apply over-the-counter and prescription medicines only as told by your doctor. These include any eye drops.  Place a cool, clean washcloth on your eye for 10-20 minutes. Do this 3-4 times a day.  Do not touch or rub your eyes.  Do not wear contact lenses until the inflammation is gone. Wear glasses instead.  Do not wear eye makeup until the inflammation is gone.  Keep all follow-up visits as told by your doctor. This is important. Contact a doctor if:  Your symptoms get worse.  Your symptoms do not get better with treatment.  You have mild eye pain.  You are sensitive to light,  You have spots or blisters on your eyes.  You have pus coming from your eye.  You have a fever. Get help right away if:  You have redness, swelling, or other symptoms in only one eye.  Your vision is blurry.  You have vision changes.  You have very bad eye pain. Summary  Allergic conjunctivitis is caused by allergies. It can make your eye red or pink, and it can make your eye feel itchy.  This condition cannot be spread from one person to another person (is not contagious).  Try not to be around things that you are allergic to.  Take or apply over-the-counter and prescription medicines  only as told by your doctor. These include any eye drops.  Contact your doctor if your symptoms get worse or they do not get better with treatment. This information is not intended to replace advice given to you by your health care provider. Make sure you discuss any questions you have with your health care provider. Document Released: 02/03/2010 Document Revised: 04/09/2016 Document Reviewed: 04/09/2016 Elsevier Interactive Patient Education  2017 Elsevier Inc.  

## 2017-11-07 NOTE — Progress Notes (Signed)
Subjective:  Patient ID: Francisco Ostrovsky, male    DOB: 1966/05/05  Age: 52 y.o. MRN: [id number]  CC: Follow-up   HPI Mylo Choi is a 52 year old male with a history of hypertension, GERD (doing well off PPI) who presents today for a follow up visit.  He had labs which revealed thrombocytopenia but repeat revealed normalized platelets elevated LFTs; he informs me today he had a previous history of alcohol consumption. MRI ordered to follow-up on adrenal nodule revealed hepatic findings suggestive of cirrhosis and right upper quadrant ultrasound ordered which comes up on 11/09/17.  MRI abdomen without contrast 10/19/17: IMPRESSION: Stable 7 mm right adrenal nodule dating back to 2015, consistent with benign etiology. This is difficult to definitively characterize due to its small size, and differential diagnosis includes pheochromocytoma, nodular hyperplasia, and adenoma. Recommend correlation with biochemical assays to determine functional status.  Probable hepatic cirrhosis.  No evidence of hepatic neoplasm.  Cholelithiasis. No radiographic evidence of cholecystitis or biliary ductal dilatation.   He endorses intermittent itching of both eyes with associated tearing and has used Visine drops OTC with improvement.  Denies visual abnormalities.  Past Medical History:  Diagnosis Date  . Depression   . Elevated liver function tests   . Gallbladder sludge   . GERD (gastroesophageal reflux disease)   . Hypercholesteremia   . Hypertension   . Obesity     Past Surgical History:  Procedure Laterality Date  . COLONOSCOPY  2016  . left testicular  childhood  . 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 ergocalciferol (DRISDOL) 50000 units capsule Take 1 capsule (50,000 Units total) by mouth once a week. 4 capsule 1  . omeprazole (PRILOSEC) 20 MG capsule Take 1 capsule (20 mg total)  by mouth daily. 30 capsule 3  . HYDROcodone-homatropine (HYCODAN) 5-1.5 MG/5ML syrup Take 5 mLs by mouth every 6 (six) hours as needed for cough. (Patient not taking: Reported on 08/19/2017) 90 mL 0  . ondansetron (ZOFRAN) 8 MG tablet Take 1 tablet (8 mg total) by mouth every 8 (eight) hours as needed for nausea or vomiting. (Patient not taking: Reported on 08/19/2017) 20 tablet 0  . ondansetron (ZOFRAN-ODT) 8 MG disintegrating tablet Take 1 tablet (8 mg total) by mouth every 8 (eight) hours as needed for nausea. (Patient not taking: Reported on 11/07/2017) 12 tablet 0   No facility-administered medications prior to visit.     ROS Review of Systems  Constitutional: Negative for activity change and appetite change.  HENT: Negative for sinus pressure and sore throat.   Eyes: Positive for discharge and itching. Negative for visual disturbance.  Respiratory: Negative for cough, chest tightness and shortness of breath.   Cardiovascular: Negative for chest pain and leg swelling.  Gastrointestinal: Negative for abdominal distention, abdominal pain, constipation and diarrhea.  Endocrine: Negative.   Genitourinary: Negative for dysuria.  Musculoskeletal: Negative for joint swelling and myalgias.  Skin: Negative for rash.  Allergic/Immunologic: Negative.   Neurological: Negative for weakness, light-headedness and numbness.  Psychiatric/Behavioral: Negative for dysphoric mood and suicidal ideas.    Objective:  BP 111/77 (BP Location: Left Arm, Patient Position: Sitting, Cuff Size: Large)   Pulse 83   Temp 98.7 F (37.1 C) (Oral)   Resp 20   Wt 219 lb (99.3 kg)   SpO2 97%   BMI 36.44 kg/m   BP/Weight 11/07/2017 10/10/2017 6/96/2952  Systolic BP 841  654 650  Diastolic BP 77 66 354  Wt. (Lbs) 219 216 -  BMI 36.44 35.94 -      Physical Exam  Constitutional: He is oriented to person, place, and time. He appears well-developed and well-nourished.  HENT:  Head: Normocephalic and  atraumatic.  Right Ear: External ear normal.  Left Ear: External ear normal.  Eyes: Conjunctivae and EOM are normal. Pupils are equal, round, and reactive to light.  Neck: Normal range of motion. Neck supple. No tracheal deviation present.  Cardiovascular: Normal rate, regular rhythm and normal heart sounds.  No murmur heard. Pulmonary/Chest: Effort normal and breath sounds normal. No respiratory distress. He has no wheezes. He exhibits no tenderness.  Abdominal: Soft. Bowel sounds are normal. He exhibits no mass. There is no tenderness.  Musculoskeletal: Normal range of motion. He exhibits no edema or tenderness.  Neurological: He is alert and oriented to person, place, and time.  Skin: Skin is warm and dry.  Psychiatric: He has a normal mood and affect.     CMP Latest Ref Rng & Units 10/10/2017 09/29/2017 08/19/2017  Glucose 65 - 99 mg/dL 93 124(H) 79  BUN 6 - 24 mg/dL 11 8 12   Creatinine 0.76 - 1.27 mg/dL 0.76 0.80 0.86  Sodium 134 - 144 mmol/L 145(H) 137 138  Potassium 3.5 - 5.2 mmol/L 4.5 3.6 3.7  Chloride 96 - 106 mmol/L 109(H) 95(L) 99  CO2 20 - 29 mmol/L 23 - 22  Calcium 8.7 - 10.2 mg/dL 9.0 - 9.2  Total Protein 6.0 - 8.5 g/dL 7.3 - 7.6  Total Bilirubin 0.0 - 1.2 mg/dL 0.8 - 2.1(H)  Alkaline Phos 39 - 117 IU/L 107 - 141(H)  AST 0 - 40 IU/L 101(H) - 120(H)  ALT 0 - 44 IU/L 98(H) - 70(H)   CBC Latest Ref Rng & Units 10/10/2017 09/29/2017 09/29/2017  WBC 3.4 - 10.8 x10E3/uL 7.9 7.3 -  Hemoglobin 13.0 - 17.7 g/dL 13.3 15.3 16.3  Hematocrit 37.5 - 51.0 % 40.3 45.1 48.0  Platelets 150 - 379 x10E3/uL 148(L) 66(L) -       Assessment & Plan:   1. Elevated liver enzymes Advised to keep upcoming appointment for ultrasound to exclude cirrhosis Hepatitis panel negative in the past - Gamma GT  2. Allergic conjunctivitis of both eyes He currently uses Visine eye drops with improvement in symptoms.   No orders of the defined types were placed in this encounter.   Follow-up:  Return in about 3 months (around 02/07/2018) for Follow-up of chronic medical conditions.   Charlott Rakes MD

## 2017-11-08 LAB — GAMMA GT: GGT: 225 IU/L — AB (ref 0–65)

## 2017-11-09 ENCOUNTER — Ambulatory Visit (HOSPITAL_COMMUNITY)
Admission: RE | Admit: 2017-11-09 | Discharge: 2017-11-09 | Disposition: A | Discharge status: Home / Self Care | Payer: Self-pay | Source: Ambulatory Visit | Attending: Family Medicine | Admitting: Family Medicine

## 2017-11-09 DIAGNOSIS — R748 Abnormal levels of other serum enzymes: Secondary | ICD-10-CM

## 2017-11-09 DIAGNOSIS — K769 Liver disease, unspecified: Secondary | ICD-10-CM | POA: Insufficient documentation

## 2017-11-14 ENCOUNTER — Ambulatory Visit: Payer: Self-pay

## 2017-12-09 ENCOUNTER — Other Ambulatory Visit: Payer: Self-pay | Admitting: Family Medicine

## 2018-02-09 ENCOUNTER — Ambulatory Visit: Payer: Self-pay | Admitting: Family Medicine

## 2020-01-01 ENCOUNTER — Inpatient Hospital Stay (HOSPITAL_COMMUNITY)
Admission: EM | Admit: 2020-01-01 | Discharge: 2020-01-11 | DRG: 433 | Disposition: A | Discharge status: Home / Self Care | Payer: Self-pay | Attending: Internal Medicine | Admitting: Internal Medicine

## 2020-01-01 ENCOUNTER — Emergency Department (HOSPITAL_COMMUNITY): Payer: Self-pay

## 2020-01-01 ENCOUNTER — Encounter (HOSPITAL_COMMUNITY): Payer: Self-pay | Admitting: Emergency Medicine

## 2020-01-01 ENCOUNTER — Other Ambulatory Visit: Payer: Self-pay

## 2020-01-01 DIAGNOSIS — R188 Other ascites: Secondary | ICD-10-CM

## 2020-01-01 DIAGNOSIS — R161 Splenomegaly, not elsewhere classified: Secondary | ICD-10-CM | POA: Diagnosis present

## 2020-01-01 DIAGNOSIS — K7682 Hepatic encephalopathy: Secondary | ICD-10-CM

## 2020-01-01 DIAGNOSIS — Z83438 Family history of other disorder of lipoprotein metabolism and other lipidemia: Secondary | ICD-10-CM

## 2020-01-01 DIAGNOSIS — Z79899 Other long term (current) drug therapy: Secondary | ICD-10-CM

## 2020-01-01 DIAGNOSIS — D689 Coagulation defect, unspecified: Secondary | ICD-10-CM

## 2020-01-01 DIAGNOSIS — K2950 Unspecified chronic gastritis without bleeding: Secondary | ICD-10-CM | POA: Diagnosis present

## 2020-01-01 DIAGNOSIS — D684 Acquired coagulation factor deficiency: Secondary | ICD-10-CM | POA: Diagnosis present

## 2020-01-01 DIAGNOSIS — K7031 Alcoholic cirrhosis of liver with ascites: Secondary | ICD-10-CM | POA: Diagnosis present

## 2020-01-01 DIAGNOSIS — J90 Pleural effusion, not elsewhere classified: Secondary | ICD-10-CM

## 2020-01-01 DIAGNOSIS — K8020 Calculus of gallbladder without cholecystitis without obstruction: Secondary | ICD-10-CM | POA: Diagnosis present

## 2020-01-01 DIAGNOSIS — D539 Nutritional anemia, unspecified: Secondary | ICD-10-CM | POA: Diagnosis present

## 2020-01-01 DIAGNOSIS — Z8719 Personal history of other diseases of the digestive system: Secondary | ICD-10-CM

## 2020-01-01 DIAGNOSIS — R1013 Epigastric pain: Secondary | ICD-10-CM

## 2020-01-01 DIAGNOSIS — Z8619 Personal history of other infectious and parasitic diseases: Secondary | ICD-10-CM

## 2020-01-01 DIAGNOSIS — D6959 Other secondary thrombocytopenia: Secondary | ICD-10-CM | POA: Diagnosis present

## 2020-01-01 DIAGNOSIS — K7290 Hepatic failure, unspecified without coma: Secondary | ICD-10-CM | POA: Diagnosis not present

## 2020-01-01 DIAGNOSIS — Z6841 Body Mass Index (BMI) 40.0 and over, adult: Secondary | ICD-10-CM

## 2020-01-01 DIAGNOSIS — D61818 Other pancytopenia: Secondary | ICD-10-CM | POA: Diagnosis present

## 2020-01-01 DIAGNOSIS — E876 Hypokalemia: Secondary | ICD-10-CM | POA: Diagnosis present

## 2020-01-01 DIAGNOSIS — K766 Portal hypertension: Secondary | ICD-10-CM

## 2020-01-01 DIAGNOSIS — N179 Acute kidney failure, unspecified: Secondary | ICD-10-CM | POA: Diagnosis not present

## 2020-01-01 DIAGNOSIS — K921 Melena: Secondary | ICD-10-CM | POA: Diagnosis present

## 2020-01-01 DIAGNOSIS — M48061 Spinal stenosis, lumbar region without neurogenic claudication: Secondary | ICD-10-CM | POA: Diagnosis present

## 2020-01-01 DIAGNOSIS — Z20822 Contact with and (suspected) exposure to covid-19: Secondary | ICD-10-CM | POA: Diagnosis present

## 2020-01-01 DIAGNOSIS — I119 Hypertensive heart disease without heart failure: Secondary | ICD-10-CM | POA: Diagnosis present

## 2020-01-01 DIAGNOSIS — Z86010 Personal history of colonic polyps: Secondary | ICD-10-CM

## 2020-01-01 DIAGNOSIS — K760 Fatty (change of) liver, not elsewhere classified: Secondary | ICD-10-CM | POA: Diagnosis present

## 2020-01-01 DIAGNOSIS — K7460 Unspecified cirrhosis of liver: Secondary | ICD-10-CM | POA: Diagnosis present

## 2020-01-01 DIAGNOSIS — Z597 Insufficient social insurance and welfare support: Secondary | ICD-10-CM

## 2020-01-01 DIAGNOSIS — F1010 Alcohol abuse, uncomplicated: Secondary | ICD-10-CM | POA: Diagnosis present

## 2020-01-01 DIAGNOSIS — E871 Hypo-osmolality and hyponatremia: Secondary | ICD-10-CM | POA: Diagnosis present

## 2020-01-01 DIAGNOSIS — K219 Gastro-esophageal reflux disease without esophagitis: Secondary | ICD-10-CM | POA: Diagnosis present

## 2020-01-01 DIAGNOSIS — K7011 Alcoholic hepatitis with ascites: Principal | ICD-10-CM

## 2020-01-01 DIAGNOSIS — N400 Enlarged prostate without lower urinary tract symptoms: Secondary | ICD-10-CM | POA: Diagnosis present

## 2020-01-01 DIAGNOSIS — K5900 Constipation, unspecified: Secondary | ICD-10-CM | POA: Diagnosis present

## 2020-01-01 DIAGNOSIS — E8809 Other disorders of plasma-protein metabolism, not elsewhere classified: Secondary | ICD-10-CM | POA: Diagnosis present

## 2020-01-01 DIAGNOSIS — Z23 Encounter for immunization: Secondary | ICD-10-CM

## 2020-01-01 DIAGNOSIS — E8770 Fluid overload, unspecified: Secondary | ICD-10-CM | POA: Diagnosis present

## 2020-01-01 DIAGNOSIS — R079 Chest pain, unspecified: Secondary | ICD-10-CM

## 2020-01-01 DIAGNOSIS — R12 Heartburn: Secondary | ICD-10-CM

## 2020-01-01 HISTORY — DX: Unspecified cirrhosis of liver: K74.60

## 2020-01-01 LAB — CBC WITH DIFFERENTIAL/PLATELET
Abs Immature Granulocytes: 0.08 10*3/uL — ABNORMAL HIGH (ref 0.00–0.07)
Basophils Absolute: 0 10*3/uL (ref 0.0–0.1)
Basophils Relative: 0 %
Eosinophils Absolute: 0 10*3/uL (ref 0.0–0.5)
Eosinophils Relative: 1 %
HCT: 36.1 % — ABNORMAL LOW (ref 39.0–52.0)
Hemoglobin: 12.3 g/dL — ABNORMAL LOW (ref 13.0–17.0)
Immature Granulocytes: 1 %
Lymphocytes Relative: 5 %
Lymphs Abs: 0.4 10*3/uL — ABNORMAL LOW (ref 0.7–4.0)
MCH: 36.8 pg — ABNORMAL HIGH (ref 26.0–34.0)
MCHC: 34.1 g/dL (ref 30.0–36.0)
MCV: 108.1 fL — ABNORMAL HIGH (ref 80.0–100.0)
Monocytes Absolute: 1 10*3/uL (ref 0.1–1.0)
Monocytes Relative: 14 %
Neutro Abs: 5.4 10*3/uL (ref 1.7–7.7)
Neutrophils Relative %: 79 %
Platelets: 51 10*3/uL — ABNORMAL LOW (ref 150–400)
RBC: 3.34 MIL/uL — ABNORMAL LOW (ref 4.22–5.81)
RDW: 18.6 % — ABNORMAL HIGH (ref 11.5–15.5)
WBC: 6.8 10*3/uL (ref 4.0–10.5)
nRBC: 0 % (ref 0.0–0.2)

## 2020-01-01 LAB — COMPREHENSIVE METABOLIC PANEL
ALT: 69 U/L — ABNORMAL HIGH (ref 0–44)
AST: 181 U/L — ABNORMAL HIGH (ref 15–41)
Albumin: 1.8 g/dL — ABNORMAL LOW (ref 3.5–5.0)
Alkaline Phosphatase: 151 U/L — ABNORMAL HIGH (ref 38–126)
Anion gap: 6 (ref 5–15)
BUN: 13 mg/dL (ref 6–20)
CO2: 25 mmol/L (ref 22–32)
Calcium: 7.8 mg/dL — ABNORMAL LOW (ref 8.9–10.3)
Chloride: 99 mmol/L (ref 98–111)
Creatinine, Ser: 0.63 mg/dL (ref 0.61–1.24)
GFR calc Af Amer: >60 mL/min (ref >60)
GFR calc non Af Amer: >60 mL/min (ref >60)
Glucose, Bld: 89 mg/dL (ref 70–99)
Potassium: 3.3 mmol/L — ABNORMAL LOW (ref 3.5–5.1)
Sodium: 130 mmol/L — ABNORMAL LOW (ref 135–145)
Total Bilirubin: 26 mg/dL (ref 0.3–1.2)
Total Protein: 6.9 g/dL (ref 6.5–8.1)

## 2020-01-01 LAB — URINALYSIS, ROUTINE W REFLEX MICROSCOPIC
Glucose, UA: 50 mg/dL — AB
Ketones, ur: 5 mg/dL — AB
Leukocytes,Ua: NEGATIVE
Nitrite: NEGATIVE
Protein, ur: NEGATIVE mg/dL
Specific Gravity, Urine: 1.013 (ref 1.005–1.030)
pH: 6 (ref 5.0–8.0)

## 2020-01-01 LAB — PROTIME-INR
INR: 3 — ABNORMAL HIGH (ref 0.8–1.2)
Prothrombin Time: 29.8 seconds — ABNORMAL HIGH (ref 11.4–15.2)

## 2020-01-01 LAB — RESPIRATORY PANEL BY RT PCR (FLU A&B, COVID)
Influenza A by PCR: NEGATIVE
Influenza B by PCR: NEGATIVE
SARS Coronavirus 2 by RT PCR: NEGATIVE

## 2020-01-01 LAB — SODIUM, URINE, RANDOM: Sodium, Ur: <10 mmol/L

## 2020-01-01 LAB — AMMONIA: Ammonia: 48 umol/L — ABNORMAL HIGH (ref 9–35)

## 2020-01-01 LAB — HIV ANTIBODY (ROUTINE TESTING W REFLEX): HIV Screen 4th Generation wRfx: NONREACTIVE

## 2020-01-01 MED ORDER — IOHEXOL 300 MG/ML  SOLN
100.0000 mL | Freq: Once | INTRAMUSCULAR | Status: AC | PRN
  Administered 2020-01-01: 12:00:00 100 mL via INTRAVENOUS

## 2020-01-01 MED ORDER — PHYTONADIONE 5 MG PO TABS
10.0000 mg | ORAL_TABLET | Freq: Every day | ORAL | Status: AC
  Administered 2020-01-01 – 2020-01-03 (×3): 10 mg via ORAL
  Filled 2020-01-01 (×3): qty 2

## 2020-01-01 MED ORDER — SODIUM CHLORIDE (PF) 0.9 % IJ SOLN
INTRAMUSCULAR | Status: AC
  Filled 2020-01-01: qty 50

## 2020-01-01 MED ORDER — POTASSIUM CHLORIDE 10 MEQ/100ML IV SOLN
10.0000 meq | INTRAVENOUS | Status: AC
  Administered 2020-01-01 (×2): 10 meq via INTRAVENOUS
  Filled 2020-01-01 (×2): qty 100

## 2020-01-01 MED ORDER — PNEUMOCOCCAL VAC POLYVALENT 25 MCG/0.5ML IJ INJ
0.5000 mL | INJECTION | INTRAMUSCULAR | Status: AC
  Administered 2020-01-02: 0.5 mL via INTRAMUSCULAR
  Filled 2020-01-01: qty 0.5

## 2020-01-01 MED ORDER — ALBUMIN HUMAN 25 % IV SOLN
25.0000 g | Freq: Once | INTRAVENOUS | Status: AC
  Administered 2020-01-02: 25 g via INTRAVENOUS
  Filled 2020-01-01: qty 100

## 2020-01-01 NOTE — H&P (Addendum)
Jesse Esparza is an 54 y.o. male.   Chief Complaint: fatigue, nausea  HPI: 3 weeks worsening fatigue, past few days (+)nausea and decreased appetite. Assoc w/ LE edema, abdominal distension, yellowing of the skin. Wife assists w/ history taking/ Pt reports he has been consuming 4-6 cans beer per day for years, stopped 2 weeks ago. Former use of liquor.   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 Moderate ascites. Cholelithiasis. Labs: transaminitis and elevated bili, hypokalemia, hypoalbuminemia, hypocalcemia, elevated PT/INR, elevated ammonia, macrocytic anemia.    Past Medical History:  Diagnosis Date  . Cirrhosis ([place])   . Depression   . Elevated liver function tests   . Gallbladder sludge   . GERD (gastroesophageal reflux disease)   . Hypercholesteremia   . Hypertension   . Obesity     Past Surgical History:  Procedure Laterality Date  . COLONOSCOPY  2016  . left testicular  childhood  . tumor on right breast  childhood    Family History  Problem Relation Age of Onset  . Hyperlipidemia Father   . Hyperlipidemia Mother   . Colon cancer Neg Hx   . Esophageal cancer Neg Hx   . Rectal cancer Neg Hx   . Stomach cancer Neg Hx    Social History:  reports that he has never smoked. He has never used smokeless tobacco. He reports current alcohol use of about 2.0 - 3.0 standard drinks of alcohol per week. He reports that he does not use drugs.  Allergies: No Known Allergies  (Not in a hospital admission)   Results for orders placed or performed during the hospital encounter of 01/01/20 (from the past 48 hour(s))  CBC with Differential/Platelet     Status: Abnormal   Collection Time: 01/01/20 10:18 AM  Result Value Ref Range   WBC 6.8 4.0 - 10.5 K/uL   RBC 3.34 (L) 4.22 - 5.81 MIL/uL   Hemoglobin 12.3 (L) 13.0 - 17.0 g/dL   HCT 36.1 (L) 39.0 - 52.0 %   MCV 108.1 (H) 80.0 - 100.0 fL   MCH 36.8 (H) 26.0 - 34.0 pg   MCHC  34.1 30.0 - 36.0 g/dL   RDW 18.6 (H) 11.5 - 15.5 %   Platelets 51 (L) 150 - 400 K/uL    Comment: Immature Platelet Fraction may be clinically indicated, consider ordering this additional test [id number] PLATELET COUNT CONFIRMED BY SMEAR    nRBC 0.0 0.0 - 0.2 %   Neutrophils Relative % 79 %   Neutro Abs 5.4 1.7 - 7.7 K/uL   Lymphocytes Relative 5 %   Lymphs Abs 0.4 (L) 0.7 - 4.0 K/uL   Monocytes Relative 14 %   Monocytes Absolute 1.0 0.1 - 1.0 K/uL   Eosinophils Relative 1 %   Eosinophils Absolute 0.0 0.0 - 0.5 K/uL   Basophils Relative 0 %   Basophils Absolute 0.0 0.0 - 0.1 K/uL   Immature Granulocytes 1 %   Abs Immature Granulocytes 0.08 (H) 0.00 - 0.07 K/uL    Comment: Performed at [facility], [place] [address] [place], [place] [address]  Comprehensive metabolic panel     Status: Abnormal   Collection Time: 01/01/20 10:18 AM  Result Value Ref Range   Sodium 130 (L) 135 - 145 mmol/L   Potassium 3.3 (L) 3.5 - 5.1 mmol/L   Chloride 99 98 - 111 mmol/L   CO2 25 22 - 32 mmol/L   Glucose, Bld 89 70 - 99 mg/dL  Comment: Glucose reference range applies only to samples taken after fasting for at least 8 hours.   BUN 13 6 - 20 mg/dL   Creatinine, Ser 0.63 0.61 - 1.24 mg/dL   Calcium 7.8 (L) 8.9 - 10.3 mg/dL   Total Protein 6.9 6.5 - 8.1 g/dL   Albumin 1.8 (L) 3.5 - 5.0 g/dL   AST 181 (H) 15 - 41 U/L   ALT 69 (H) 0 - 44 U/L   Alkaline Phosphatase 151 (H) 38 - 126 U/L   Total Bilirubin 26.0 (HH) 0.3 - 1.2 mg/dL    Comment: ICTERIC SPECIMEN CRITICAL RESULT CALLED TO, READ BACK BY AND VERIFIED WITH: MOORE,C. RN AT 1131 01/01/20 MULLINS,T    GFR calc non Af Amer >60 >60 mL/min   GFR calc Af Amer >60 >60 mL/min   Anion gap 6 5 - 15    Comment: Performed at [facility] [place] [address] [place], [place] [address]  Protime-INR     Status: Abnormal   Collection Time: 01/01/20 10:18 AM  Result Value Ref Range   Prothrombin Time 29.8 (H) 11.4 -  15.2 seconds   INR 3.0 (H) 0.8 - 1.2    Comment: (NOTE) INR goal varies based on device and disease states. Performed at [facility], [place] [address], [place], [place] [address]   Ammonia     Status: Abnormal   Collection Time: 01/01/20 10:18 AM  Result Value Ref Range   Ammonia 48 (H) 9 - 35 umol/L    Comment: Performed at [facility], [place] [address], [place], [place] [address]  Urinalysis, Routine w reflex microscopic     Status: Abnormal   Collection Time: 01/01/20 10:31 AM  Result Value Ref Range   Color, Urine AMBER (A) YELLOW    Comment: BIOCHEMICALS MAY BE AFFECTED BY COLOR   APPearance HAZY (A) CLEAR   Specific Gravity, Urine 1.013 1.005 - 1.030   pH 6.0 5.0 - 8.0   Glucose, UA 50 (A) NEGATIVE mg/dL   Hgb urine dipstick SMALL (A) NEGATIVE   Bilirubin Urine MODERATE (A) NEGATIVE   Ketones, ur 5 (A) NEGATIVE mg/dL   Protein, ur NEGATIVE NEGATIVE mg/dL   Nitrite NEGATIVE NEGATIVE   Leukocytes,Ua NEGATIVE NEGATIVE   RBC / HPF 0-5 0 - 5 RBC/hpf   WBC, UA 0-5 0 - 5 WBC/hpf   Bacteria, UA RARE (A) NONE SEEN   Squamous Epithelial / LPF 0-5 0 - 5   Mucus PRESENT     Comment: Performed at [facility], [place] [address] [place], [place] [address]   CT ABDOMEN PELVIS W CONTRAST  Result Date: 01/01/2020 CLINICAL DATA:  Cirrhosis, weight gain, abdominal distension, leg swelling, jaundice, history GERD, hypertension EXAM: CT ABDOMEN AND PELVIS WITH CONTRAST TECHNIQUE: Multidetector CT imaging of the abdomen and pelvis was performed using the standard protocol following bolus administration of intravenous contrast. Sagittal and coronal MPR images reconstructed from axial data set. CONTRAST:  152mL OMNIPAQUE IOHEXOL 300 MG/ML SOLN IV. No oral contrast. COMPARISON:  04/05/2016 FINDINGS: Lower chest: Subsegmental atelectasis RIGHT lower lobe. Tiny RIGHT pleural effusion. Hepatobiliary: Dependent calculi within gallbladder. Gallbladder  wall appears thickened, nonspecific in the setting of ascites. Heterogeneous cirrhotic appearing liver without discrete mass. No intrahepatic or extrahepatic biliary dilatation. Pancreas: Normal appearance Spleen: Mildly enlarged, 14.6 x 13.6 x 5.3 cm (volume = 550 cm^3). No focal mass. Adrenals/Urinary Tract: Adrenal glands, kidneys, ureters, and bladder normal appearance Stomach/Bowel: Normal appendix. Stomach  and bowel loops normal appearance Vascular/Lymphatic: Aorta normal caliber with mild scattered atherosclerotic calcifications. Vascular structures patent. Large perisplenic collateral extending to LEFT renal vein consistent with spontaneous splenorenal shunt. Major vascular structures patent. No adenopathy. Reproductive: Unremarkable seminal vesicles. Prostate gland minimally enlarged at 4.8 x 3.5 cm image 96. Other: Moderate ascites. No free air. No hernia. Scattered edema/infiltration of tissue planes in abdomen. 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 Moderate ascites. Cholelithiasis. Minimal prostatic enlargement. Electronically Signed   By: Lavonia Dana M.D.   On: 01/01/2020 12:54    Review of Systems  Constitutional: Positive for fatigue.  Respiratory: Negative for cough, choking, chest tightness and shortness of breath.   Cardiovascular: Positive for leg swelling.  Gastrointestinal: Positive for abdominal distention. Negative for abdominal pain.  Genitourinary: Negative for difficulty urinating.  Skin: Positive for color change.  Neurological: Positive for light-headedness. Negative for syncope.  Psychiatric/Behavioral: Negative for behavioral problems and confusion.    Blood pressure 127/71, pulse 91, temperature 98.2 F (36.8 C), temperature source Oral, resp. rate 18, SpO2 97 %. Physical Exam  Constitutional: He is oriented to person, place, and time. He appears well-developed and well-nourished.  HENT:  Head:  Normocephalic and atraumatic.  Eyes: Scleral icterus is present.  Cardiovascular: Normal rate, regular rhythm and normal heart sounds.  Respiratory: Effort normal and breath sounds normal. No respiratory distress.  GI: He exhibits distension. There is no abdominal tenderness. There is no rebound.  Neurological: He is alert and oriented to person, place, and time.  Skin: Skin is warm and dry.  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 Life Expectancy : 1-3 years; Abdominal surgery peri-operative mortality: 82% --> electrolyte management as below  --> consider r/o viral hepatitis, hemochromatosis, iron storage disease --> non-Rx VTE ppx, ?consider fibrinogen to estimate bleed risk  --> ?lactulose, beta blocker, spironolactone and/or furosemide per GI recs  --> paracentesis etc per GI  --> pt reports abstinence from EtOH, got CIWA just in case  --> other GI orders: Rx Vitamin K, Echo, Thiamine, Folate; labs alpha 1 antitrypsin, GGT, ANA, anti-smooth muscle Ab   Hyponatremia likely hypervolemia hypoosmolar d/t cirrhosis  Hypokalemia --> repletion   Macrocytic anemia --> check B12, folate as above     Emeterio Reeve, DO 01/01/2020, 2:30 PM

## 2020-01-01 NOTE — ED Notes (Signed)
Admit MD made aware of below critical result BR [id number]

## 2020-01-01 NOTE — Progress Notes (Signed)
Report received from [facility].

## 2020-01-01 NOTE — ED Provider Notes (Signed)
[facility] DEPT Provider Note   CSN: [id number] Arrival date & time: 01/01/20  [id number]     History Chief Complaint  Patient presents with  . abdominal distention  . Leg Swelling  . Jaundice    Jesse Esparza is a 54 y.o. male.  Patient complains of weakness nausea dizziness. Patient has a long history of alcohol abuse  The history is provided by the patient. No language interpreter was used.  Weakness Severity:  Moderate Onset quality:  Gradual Timing:  Constant Progression:  Worsening Chronicity:  New Context: alcohol use   Relieved by:  Nothing Worsened by:  Nothing Ineffective treatments:  None tried Associated symptoms: abdominal pain   Associated symptoms: no chest pain, no cough, no diarrhea, no frequency, no headaches and no seizures        Past Medical History:  Diagnosis Date  . Cirrhosis ([place])   . Depression   . Elevated liver function tests   . Gallbladder sludge   . GERD (gastroesophageal reflux disease)   . Hypercholesteremia   . Hypertension   . Obesity     Patient Active Problem List   Diagnosis Date Noted  . Spinal stenosis of lumbar region 05/04/2016  . Adrenal nodule ([place]) 04/16/2016  . Hyperbilirubinemia 03/31/2016  . GERD (gastroesophageal reflux disease) 03/19/2016  . Essential hypertension, benign 03/13/2014  . Elevated BP 12/14/2013  . Obesity, unspecified 12/14/2013    Past Surgical History:  Procedure Laterality Date  . COLONOSCOPY  2016  . left testicular  childhood  . tumor on right breast  childhood       Family History  Problem Relation Age of Onset  . Hyperlipidemia Father   . Hyperlipidemia Mother   . Colon cancer Neg Hx   . Esophageal cancer Neg Hx   . Rectal cancer Neg Hx   . Stomach cancer Neg Hx     Social History   Tobacco Use  . Smoking status: Never Smoker  . Smokeless tobacco: Never Used  Substance Use Topics  . Alcohol use: Yes    Alcohol/week: 2.0 - 3.0 standard  drinks    Types: 2 - 3 Shots of liquor per week    Comment: last drink 6-7 months ago  . Drug use: No    Home Medications Prior to Admission medications   Medication Sig Start Date End Date Taking? Authorizing Provider  simethicone (MYLICON) 0000000 MG chewable tablet Chew 125 mg by mouth every 6 (six) hours as needed for flatulence.   Yes [provider]    Allergies    Patient has no known allergies.  Review of Systems   Review of Systems  Constitutional: Negative for appetite change and fatigue.  HENT: Negative for congestion, ear discharge and sinus pressure.   Eyes: Negative for discharge.  Respiratory: Negative for cough.   Cardiovascular: Negative for chest pain.  Gastrointestinal: Positive for abdominal pain. Negative for diarrhea.  Genitourinary: Negative for frequency and hematuria.  Musculoskeletal: Negative for back pain.  Skin: Negative for rash.  Neurological: Positive for weakness. Negative for seizures and headaches.  Psychiatric/Behavioral: Negative for hallucinations.    Physical Exam Updated Vital Signs BP 127/71   Pulse 91   Temp 98.2 F (36.8 C) (Oral)   Resp 18   SpO2 97%   Physical Exam Vitals and nursing note reviewed.  Constitutional:      Appearance: He is well-developed.  HENT:     Head: Normocephalic.     Nose: Nose normal.  Eyes:     General: No scleral icterus.    Conjunctiva/sclera: Conjunctivae normal.     Comments: Sclera anicteric  Neck:     Thyroid: No thyromegaly.  Cardiovascular:     Rate and Rhythm: Normal rate and regular rhythm.     Heart sounds: No murmur. No friction rub. No gallop.   Pulmonary:     Breath sounds: No stridor. No wheezing or rales.  Chest:     Chest wall: No tenderness.  Abdominal:     General: There is distension.     Tenderness: There is no abdominal tenderness. There is no rebound.  Musculoskeletal:        General: Normal range of motion.     Cervical back: Neck supple.  Lymphadenopathy:      Cervical: No cervical adenopathy.  Skin:    Coloration: Skin is jaundiced.     Findings: No erythema or rash.  Neurological:     Mental Status: He is alert and oriented to person, place, and time.     Motor: No abnormal muscle tone.     Coordination: Coordination normal.  Psychiatric:        Behavior: Behavior normal.     ED Results / Procedures / Treatments   Labs (all labs ordered are listed, but only abnormal results are displayed) Labs Reviewed  CBC WITH DIFFERENTIAL/PLATELET - Abnormal; Notable for the following components:      Result Value   RBC 3.34 (*)    Hemoglobin 12.3 (*)    HCT 36.1 (*)    MCV 108.1 (*)    MCH 36.8 (*)    RDW 18.6 (*)    Platelets 51 (*)    Lymphs Abs 0.4 (*)    Abs Immature Granulocytes 0.08 (*)    All other components within normal limits  COMPREHENSIVE METABOLIC PANEL - Abnormal; Notable for the following components:   Sodium 130 (*)    Potassium 3.3 (*)    Calcium 7.8 (*)    Albumin 1.8 (*)    AST 181 (*)    ALT 69 (*)    Alkaline Phosphatase 151 (*)    Total Bilirubin 26.0 (*)    All other components within normal limits  PROTIME-INR - Abnormal; Notable for the following components:   Prothrombin Time 29.8 (*)    INR 3.0 (*)    All other components within normal limits  AMMONIA - Abnormal; Notable for the following components:   Ammonia 48 (*)    All other components within normal limits  URINALYSIS, ROUTINE W REFLEX MICROSCOPIC - Abnormal; Notable for the following components:   Color, Urine AMBER (*)    APPearance HAZY (*)    Glucose, UA 50 (*)    Hgb urine dipstick SMALL (*)    Bilirubin Urine MODERATE (*)    Ketones, ur 5 (*)    Bacteria, UA RARE (*)    All other components within normal limits  RESPIRATORY PANEL BY RT PCR (FLU A&B, COVID)    EKG None  Radiology CT ABDOMEN PELVIS W CONTRAST  Result Date: 01/01/2020 CLINICAL DATA:  Cirrhosis, weight gain, abdominal distension, leg swelling, jaundice, history  GERD, hypertension EXAM: CT ABDOMEN AND PELVIS WITH CONTRAST TECHNIQUE: Multidetector CT imaging of the abdomen and pelvis was performed using the standard protocol following bolus administration of intravenous contrast. Sagittal and coronal MPR images reconstructed from axial data set. CONTRAST:  145mL OMNIPAQUE IOHEXOL 300 MG/ML SOLN IV. No oral contrast. COMPARISON:  04/05/2016 FINDINGS: Lower  chest: Subsegmental atelectasis RIGHT lower lobe. Tiny RIGHT pleural effusion. Hepatobiliary: Dependent calculi within gallbladder. Gallbladder wall appears thickened, nonspecific in the setting of ascites. Heterogeneous cirrhotic appearing liver without discrete mass. No intrahepatic or extrahepatic biliary dilatation. Pancreas: Normal appearance Spleen: Mildly enlarged, 14.6 x 13.6 x 5.3 cm (volume = 550 cm^3). No focal mass. Adrenals/Urinary Tract: Adrenal glands, kidneys, ureters, and bladder normal appearance Stomach/Bowel: Normal appendix. Stomach and bowel loops normal appearance Vascular/Lymphatic: Aorta normal caliber with mild scattered atherosclerotic calcifications. Vascular structures patent. Large perisplenic collateral extending to LEFT renal vein consistent with spontaneous splenorenal shunt. Major vascular structures patent. No adenopathy. Reproductive: Unremarkable seminal vesicles. Prostate gland minimally enlarged at 4.8 x 3.5 cm image 96. Other: Moderate ascites. No free air. No hernia. Scattered edema/infiltration of tissue planes in abdomen. 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 Moderate ascites. Cholelithiasis. Minimal prostatic enlargement. Electronically Signed   By: Lavonia Dana M.D.   On: 01/01/2020 12:54    Procedures Procedures (including critical care time)  Medications Ordered in ED Medications  sodium chloride (PF) 0.9 % injection (  Given by Other 01/01/20 1346)  iohexol (OMNIPAQUE) 300 MG/ML solution 100 mL (100  mLs Intravenous Contrast Given 01/01/20 1215)    ED Course  I have reviewed the triage vital signs and the nursing notes.  Pertinent labs & imaging results that were available during my care of the patient were reviewed by me and considered in my medical decision making (see chart for details).    MDM Rules/Calculators/A&P                      Patient jaundice from cirrhosis. He will be admitted to medicine with GI consult     This patient presents to the ED for concern of weakness, this involves an extensive number of treatment options, and is a complaint that carries with it a high risk of complications and morbidity.  The differential diagnosis includes infection MI metabolic abnormality   Lab Tests:   I Ordered, reviewed, and interpreted labs, which included CBC with chemistries and liver functions. It showed his bilirubin is 26  Medicines ordered:   I ordered medication normal saline for dehydration  Imaging Studies ordered:   I ordered imaging studies which included CT abdomen and  I independently visualized and interpreted imaging which showed cirrhosis  Additional history obtained:   Additional history obtained from sister  Previous records obtained and reviewed  Consultations Obtained:   I consulted hospitalist and gastroenterologist and discussed lab and imaging findings  Reevaluation:  After the interventions stated above, I reevaluated the patient and found unchanged  Critical Interventions:  .   Final Clinical Impression(s) / ED Diagnoses Final diagnoses:  Hyperbilirubinemia    Rx / DC Orders ED Discharge Orders    None       Milton Ferguson, MD 01/01/20 1430

## 2020-01-01 NOTE — ED Triage Notes (Signed)
Pt's wife reports that patient has cirrhosis and for past 3 weeks having weight gain, abd distention, leg swelling, yellow coloring, not able to eat, legs weak and fell yesterday. Pt doesn't have a PCP.

## 2020-01-01 NOTE — ED Notes (Signed)
Unable to give meds at scheduled time. Waiting for pharmacy to verify.

## 2020-01-01 NOTE — ED Notes (Signed)
Pt and all belongings transferred upstairs by [place] [facility]

## 2020-01-01 NOTE — Consult Note (Signed)
Referring Provider: Dr. Milton Ferguson  Primary Care Physician:  Charlott Rakes, MD Primary Gastroenterologist:  Dr. Loletha Carrow   Reason for Consultation:  EtOH cirrhosis   HPI: Jesse Esparza is a 54 y.o. male with a past medical history of depression,  H. pylori gastritis 2017, colon polyps and alcoholic cirrhosis. His sister, Meredith Pel, is present to assist with interpretation as he speaks [place] fairly well but additional interpretation required. He presented to [facility] ED earlier this morning with complaints of nausea, decreased appetite, abdominal distension and lower extremity edema which has progressively worsened over the past week. No specific abdominal pain. He reports drinking 1/2 of a fifth of whiskey only from the age of 41 until 4 or 5 years ago.  He reported drinking 4-6 8 to 12 ounce beers daily and 12 beers on Fridays and Saturdays.  He stopped drinking all alcohol approximately 2 weeks ago.  He denied having any significant withdrawal symptoms.  He reported feeling chilled and shaky for about 30 minutes on 1 day about 1 week ago.  He stated he was informed by one of his doctors for 5 years ago that he had cirrhosis.  It was at that time,  he stopped drinking whiskey and switched to beer.  No drug use. He is a nonsmoker. He complains of having fatigue.  No chest pain or shortness of breath.  He noticed his urine was darker in color about 2 weeks ago.  No pruritus.  He has nausea without vomiting.  Decreased appetite without weight loss. He awakened one morning confused, didn't know where he was which resolved within 30 minutes. No further confusion since then.   He reports passing small thin black solid stools for the past 4 to 5 years.  He often has constipation.  He reports passing bright red blood per the rectum 2 occasions 2 months ago and most recently occurred approximately 1 month ago.  He describes seeing bright red blood on the toilet tissue and in the toilet water which  occurred after straining.  He denies having any dysphagia or heartburn.  No upper abdominal pain.  He is somewhat uncomfortable due to his abdominal distention which started about 1 week ago with a noticeable swelling to his legs as well.  No NSAIDs.  Three maternal uncles died from alcoholic cirrhosis in their 25s. He was previously evaluated by Dr. Loletha Carrow in our office  in 2017 for further evaluation regarding epigastric pain, jaundice with elevated LFTs ( AST 124. ALT 62. T. Bili 4.0. Alk phos 116). An abdominal/pelvic CT 04/05/2016  identified a fatty liver without evidence of cirrhosis at that time.  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  No hepatoma.( A repeat abd  MRI was done 09/2017 assessed the adrenal lesion to be benign).  He underwent an EGD 04/07/2016 which showed a normal esophagus and H. pylori gastritis which was treated with Pylera and omeprazole.  Colonoscopy was done 10/14/2014 by Dr. Deatra Ina which identified 3 sessile serrated, hyperplastic and lymphoid polyps and diverticulosis to the sigmoid colon.  ED course: Sodium 130.  Potassium 3.3.  Glucose 89.  BUN 13.  Creatinine 0.63.  Calcium 7.8.  Anion gap 6.  Alk phos 151.  AST 181.  ALT 69.  Total bili 26.  Albumin 1.8.  WBC 6.8.  Hemoglobin 12.3.  Hematocrit 36.1.  MCV 108.1.  Platelet 51. PT 29.8.  INR  3.0. Discriminant function 101.9.   Abdominal/Pelvic CT with contrast 01/01/2020: Cirrhotic liver with splenomegaly and spontaneous splenorenal shunt. Moderate ascites. Cholelithiasis. Minimal prostatic enlargement  GI procedures:   EGD 04/07/2016: - Normal esophagus. - Chronic gastritis.  Biopsies identified chronic mildly active H. Pylori gastritis.  No metaplasia or dysplasia. - Normal examined duodenum.  Colonoscopy 10/14/2014 by Dr. Deatra Ina: 1. Two sessile polyps ranging from 2 to 17m in size were found in  the ascending colon.  2. Sessile polyp was found in the sigmoid colon; polypectomy was performed with a cold snare 3. Mild diverticulosis was noted in the sigmoid colon Biopsies: Surgical [P], ascending sigmoid, polyps (3) - SESSILE SERRATED POLYP, HYPERPLASTIC POLYP AND BENIGN LYMPHOID POLYP. - NO HIGH GRADE DYSPLASIA OR MALIGNANCY IDENTIFIED - Recall colonoscopy 3 years recommended   Past Medical History:  Diagnosis Date  . [place]   . Depression   . Elevated liver function tests   . Gallbladder sludge   . GERD (gastroesophageal reflux disease)   . Hypercholesteremia   . Hypertension   . Obesity     Past Surgical History:  Procedure Laterality Date  . COLONOSCOPY  2016  . left testicular  childhood  . tumor on right breast  childhood    Prior to Admission medications   Medication Sig Start Date End Date Taking? Authorizing Provider  simethicone (MYLICON) 1542MG chewable tablet Chew 125 mg by mouth every 6 (six) hours as needed for flatulence.   Yes [provider]    No current facility-administered medications for this encounter.   Current Outpatient Medications  Medication Sig Dispense Refill  . simethicone (MYLICON) 1706MG chewable tablet Chew 125 mg by mouth every 6 (six) hours as needed for flatulence.      Allergies as of 01/01/2020  . (No Known Allergies)    Family History  Problem Relation Age of Onset  . Hyperlipidemia Father   . Hyperlipidemia Mother   . Colon cancer Neg Hx   . Esophageal cancer Neg Hx   . Rectal cancer Neg Hx   . Stomach cancer Neg Hx     Social History   Socioeconomic History  . Marital status: Divorced    Spouse name: Not on file  . Number of children: Not on file  . Years of education: Not on file  . Highest education level: Not on file  Occupational History  . Not on file  Tobacco Use  . Smoking status: Never Smoker  . Smokeless tobacco: Never Used  Substance and Sexual Activity  . Alcohol use: Yes     Alcohol/week: 2.0 - 3.0 standard drinks    Types: 2 - 3 Shots of liquor per week    Comment: last drink 6-7 months ago  . Drug use: No  . Sexual activity: Not on file  Other Topics Concern  . Not on file  Social History Narrative  . Not on file   Social Determinants of Health   Financial Resource Strain:   . Difficulty of Paying Living Expenses:   Food Insecurity:   . Worried About RCharity fundraiserin the Last Year:   . RArboriculturistin the Last Year:   Transportation Needs:   . LFilm/video editor(Medical):   .Marland KitchenLack of Transportation (Non-Medical):   Physical Activity:   . Days of Exercise per Week:   . Minutes of Exercise per Session:   Stress:   . Feeling of Stress :   Social  Connections:   . Frequency of Communication with Friends and Family:   . Frequency of Social Gatherings with Friends and Family:   . Attends Religious Services:   . Active Member of Clubs or Organizations:   . Attends Archivist Meetings:   Marland Kitchen Marital Status:   Intimate Partner Violence:   . Fear of Current or Ex-Partner:   . Emotionally Abused:   Marland Kitchen Physically Abused:   . Sexually Abused:     Review of Systems: See HPI, all other systems reviewed and are negative  Physical Exam: Vital signs in last 24 hours: Temp:  [98.2 F (36.8 C)] 98.2 F (36.8 C) (05/04 0900) Pulse Rate:  [88-97] 91 (05/04 1400) Resp:  [16-18] 18 (05/04 1400) BP: (107-127)/(62-74) 127/71 (05/04 1400) SpO2:  [94 %-98 %] 97 % (05/04 1400)   General:  Alert 54 year old male in no acute distress. Head:  Normocephalic and atraumatic. Eyes: Moderate scleral icterus. Conjunctiva pink. Ears:  Normal auditory acuity. Nose:  No deformity, discharge or lesions. Mouth:  Dentition intact. No ulcers or lesions.  Yellowish coating on tongue. Neck:  Supple. No lymphadenopathy or thyromegaly.  Lungs: Breath sounds clear throughout. Heart: Regular rate and rhythm, no murmurs. Abdomen: Upper abdomen is  distended, moderate amount of ascites but the abdomen is not tense.  Nontender.  No obvious hepatomegaly. + Splenomegaly.  Positive bowel sounds to all 4 quadrants. Rectal: No external hemorrhoids.  Internal hemorrhoids palpated.  Brown stool in the rectal vault grossly guaiac positive.  No red blood in the rectum. Musculoskeletal:  Symmetrical without gross deformities.  Pulses:  Normal pulses noted. Extremities: 1+ pitting edema to the lower extremities bilaterally. Neurologic:  Alert and  oriented x4. No focal deficits.  No asterixis.  Tremors. Skin: Jaundiced.Intact without significant lesions or rashes. Psych:  Alert and cooperative. Normal mood and affect.  Intake/Output from previous day: No intake/output data recorded. Intake/Output this shift: No intake/output data recorded.  Lab Results: Recent Labs    01/01/20 1018  WBC 6.8  HGB 12.3*  HCT 36.1*  PLT 51*   BMET Recent Labs    01/01/20 1018  NA 130*  K 3.3*  CL 99  CO2 25  GLUCOSE 89  BUN 13  CREATININE 0.63  CALCIUM 7.8*   LFT Recent Labs    01/01/20 1018  PROT 6.9  ALBUMIN 1.8*  AST 181*  ALT 69*  ALKPHOS 151*  BILITOT 26.0*   PT/INR Recent Labs    01/01/20 1018  LABPROT 29.8*  INR 3.0*   Hepatitis Panel No results for input(s): HEPBSAG, HCVAB, HEPAIGM, HEPBIGM in the last 72 hours.    Studies/Results: CT ABDOMEN PELVIS W CONTRAST  Result Date: 01/01/2020 CLINICAL DATA:  Cirrhosis, weight gain, abdominal distension, leg swelling, jaundice, history GERD, hypertension EXAM: CT ABDOMEN AND PELVIS WITH CONTRAST TECHNIQUE: Multidetector CT imaging of the abdomen and pelvis was performed using the standard protocol following bolus administration of intravenous contrast. Sagittal and coronal MPR images reconstructed from axial data set. CONTRAST:  138m OMNIPAQUE IOHEXOL 300 MG/ML SOLN IV. No oral contrast. COMPARISON:  04/05/2016 FINDINGS: Lower chest: Subsegmental atelectasis RIGHT lower lobe. Tiny  RIGHT pleural effusion. Hepatobiliary: Dependent calculi within gallbladder. Gallbladder wall appears thickened, nonspecific in the setting of ascites. Heterogeneous cirrhotic appearing liver without discrete mass. No intrahepatic or extrahepatic biliary dilatation. Pancreas: Normal appearance Spleen: Mildly enlarged, 14.6 x 13.6 x 5.3 cm (volume = 550 cm^3). No focal mass. Adrenals/Urinary Tract: Adrenal glands, kidneys, ureters, and bladder normal  appearance Stomach/Bowel: Normal appendix. Stomach and bowel loops normal appearance Vascular/Lymphatic: Aorta normal caliber with mild scattered atherosclerotic calcifications. Vascular structures patent. Large perisplenic collateral extending to LEFT renal vein consistent with spontaneous splenorenal shunt. Major vascular structures patent. No adenopathy. Reproductive: Unremarkable seminal vesicles. Prostate gland minimally enlarged at 4.8 x 3.5 cm image 96. Other: Moderate ascites. No free air. No hernia. Scattered edema/infiltration of tissue planes in abdomen. 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 Moderate ascites. Cholelithiasis. Minimal prostatic enlargement. Electronically Signed   By: Lavonia Dana M.D.   On: 01/01/2020 12:54    IMPRESSION/PLAN:  66.  54 year old male with developing acute alcoholic hepatitis with chronic alcoholic cirrhosis with ascites.  Albumin 1.8.  Alk phos 151.  AST 181.  ALT 69.  Total bili 26.  PT 29.8.  INR 3.0.  Discriminant function 101.9.  WBC 6.8. CT abdomen pelvis 01/01/2020 consistent with cirrhosis with moderate ascites, splenomegaly with a spontaneous splenorenal shunt and cholelithiasis. -Sono guided paracentesis, labs to include cell count with diff, gram stain, aerobic and anaerobic cultures, albumin, protein and cytology.  IR prefers to defer paracentesis until tomorrow due to INR 3.0. Patient to receive Vitamin K '10mg'$  po x 3 days first dose now.    -Patient to receive 25 gm of albumin IV after paracentesis completed. -Discriminant function 101.9, Prednisolone deferred until SBP ruled out -ECHO to rule out right heart failure -No alcohol ever discussed with patient  -Thiamine 100 mg p.o. daily, folate 1 mg daily and multivitamin daily -No diuretics due to hyponatremia and hypokalemia -In am check hepatic panel, INR, BNP, hep A, B and C serologies. Ceruloplasmin. A1AT. Iron. Ferritin.  B12 and folate. Urine sodium level.  -? CIWA protocol   2.  Hyponatremia secondary to #1.  -Fluid restriction not advised unless sodium level less than 125  3.  Hypokalemia -KCL Placement per the hospitalist  4.  Macrocytic anemia.  Hemoglobin 12.3. (Base line Hg 13.3 on 10/10/2017).  Hematocrit 36.1.  MCV 108.1.  -Repeat CBC in am. Iron, vitamin B12.   5.  Coagulopathy secondary to cirrhosis. INR 3.0. -Vitamin K '10mg'$  po Q day x 3 days -Repeat INR in am.  6.  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 PLT 51.   7.  Patient reports 4 to 5-year history of black solid stool and intermittent bright red rectal bleeding x 3 occasions over the past 2 months.  Exam today showed brown stool grossly heme positive. -EGD to assess for esophageal and gastric varices after INR corrected -Colonoscopy as outpatient after his cirrhosis has stabilized  8. History of colon polyps -See plan in # 7  Further  recommendations per Dr. Adline Peals Dorathy Daft  01/01/2020, 2:16 PM

## 2020-01-02 ENCOUNTER — Inpatient Hospital Stay (HOSPITAL_COMMUNITY): Payer: Self-pay

## 2020-01-02 DIAGNOSIS — K7011 Alcoholic hepatitis with ascites: Secondary | ICD-10-CM

## 2020-01-02 DIAGNOSIS — K766 Portal hypertension: Secondary | ICD-10-CM

## 2020-01-02 DIAGNOSIS — D689 Coagulation defect, unspecified: Secondary | ICD-10-CM

## 2020-01-02 DIAGNOSIS — I5030 Unspecified diastolic (congestive) heart failure: Secondary | ICD-10-CM

## 2020-01-02 LAB — COMPREHENSIVE METABOLIC PANEL
ALT: 71 U/L — ABNORMAL HIGH (ref 0–44)
AST: 168 U/L — ABNORMAL HIGH (ref 15–41)
Albumin: 1.7 g/dL — ABNORMAL LOW (ref 3.5–5.0)
Alkaline Phosphatase: 137 U/L — ABNORMAL HIGH (ref 38–126)
Anion gap: 8 (ref 5–15)
BUN: 15 mg/dL (ref 6–20)
CO2: 25 mmol/L (ref 22–32)
Calcium: 7.9 mg/dL — ABNORMAL LOW (ref 8.9–10.3)
Chloride: 100 mmol/L (ref 98–111)
Creatinine, Ser: 0.8 mg/dL (ref 0.61–1.24)
GFR calc Af Amer: >60 mL/min (ref >60)
GFR calc non Af Amer: >60 mL/min (ref >60)
Glucose, Bld: 83 mg/dL (ref 70–99)
Potassium: 3.2 mmol/L — ABNORMAL LOW (ref 3.5–5.1)
Sodium: 133 mmol/L — ABNORMAL LOW (ref 135–145)
Total Bilirubin: 26.2 mg/dL (ref 0.3–1.2)
Total Protein: 6.7 g/dL (ref 6.5–8.1)

## 2020-01-02 LAB — CBC WITH DIFFERENTIAL/PLATELET
Abs Immature Granulocytes: 0.1 10*3/uL — ABNORMAL HIGH (ref 0.00–0.07)
Basophils Absolute: 0 10*3/uL (ref 0.0–0.1)
Basophils Relative: 1 %
Eosinophils Absolute: 0.1 10*3/uL (ref 0.0–0.5)
Eosinophils Relative: 1 %
HCT: 34.1 % — ABNORMAL LOW (ref 39.0–52.0)
Hemoglobin: 12 g/dL — ABNORMAL LOW (ref 13.0–17.0)
Immature Granulocytes: 1 %
Lymphocytes Relative: 7 %
Lymphs Abs: 0.5 10*3/uL — ABNORMAL LOW (ref 0.7–4.0)
MCH: 37.9 pg — ABNORMAL HIGH (ref 26.0–34.0)
MCHC: 35.2 g/dL (ref 30.0–36.0)
MCV: 107.6 fL — ABNORMAL HIGH (ref 80.0–100.0)
Monocytes Absolute: 0.9 10*3/uL (ref 0.1–1.0)
Monocytes Relative: 13 %
Neutro Abs: 5.5 10*3/uL (ref 1.7–7.7)
Neutrophils Relative %: 77 %
Platelets: 54 10*3/uL — ABNORMAL LOW (ref 150–400)
RBC: 3.17 MIL/uL — ABNORMAL LOW (ref 4.22–5.81)
RDW: 18.2 % — ABNORMAL HIGH (ref 11.5–15.5)
WBC: 7.1 10*3/uL (ref 4.0–10.5)
nRBC: 0 % (ref 0.0–0.2)

## 2020-01-02 LAB — BODY FLUID CELL COUNT WITH DIFFERENTIAL
Lymphs, Fluid: 5 %
Monocyte-Macrophage-Serous Fluid: 92 % — ABNORMAL HIGH (ref 50–90)
Neutrophil Count, Fluid: 3 % (ref 0–25)
Total Nucleated Cell Count, Fluid: 123 cu mm (ref 0–1000)

## 2020-01-02 LAB — FOLATE: Folate: 6.9 ng/mL (ref >5.9)

## 2020-01-02 LAB — IRON AND TIBC
Iron: 104 ug/dL (ref 45–182)
Saturation Ratios: 120 % — ABNORMAL HIGH (ref 17.9–39.5)
TIBC: 87 ug/dL — ABNORMAL LOW (ref 250–450)

## 2020-01-02 LAB — PROTEIN, PLEURAL OR PERITONEAL FLUID: Total protein, fluid: <3 g/dL

## 2020-01-02 LAB — PROTIME-INR
INR: 3.3 — ABNORMAL HIGH (ref 0.8–1.2)
Prothrombin Time: 32.6 seconds — ABNORMAL HIGH (ref 11.4–15.2)

## 2020-01-02 LAB — ECHOCARDIOGRAM COMPLETE
Height: 65 in
Weight: 4065.6 oz

## 2020-01-02 LAB — GRAM STAIN

## 2020-01-02 LAB — GAMMA GT: GGT: 117 U/L — ABNORMAL HIGH (ref 7–50)

## 2020-01-02 LAB — HEPATITIS C ANTIBODY: HCV Ab: NONREACTIVE

## 2020-01-02 LAB — HEPATITIS A ANTIBODY, TOTAL: hep A Total Ab: REACTIVE — AB

## 2020-01-02 LAB — FERRITIN: Ferritin: 1692 ng/mL — ABNORMAL HIGH (ref 24–336)

## 2020-01-02 LAB — ALBUMIN, PLEURAL OR PERITONEAL FLUID: Albumin, Fluid: <1 g/dL

## 2020-01-02 LAB — HEPATITIS B SURFACE ANTIGEN: Hepatitis B Surface Ag: NONREACTIVE

## 2020-01-02 LAB — HEPATITIS B CORE ANTIBODY, IGM: Hep B C IgM: NONREACTIVE

## 2020-01-02 LAB — VITAMIN B12: Vitamin B-12: 2818 pg/mL — ABNORMAL HIGH (ref 180–914)

## 2020-01-02 LAB — HEPATITIS B CORE ANTIBODY, TOTAL: Hep B Core Total Ab: NONREACTIVE

## 2020-01-02 MED ORDER — POLYETHYLENE GLYCOL 3350 17 G PO PACK
17.0000 g | PACK | Freq: Every day | ORAL | Status: DC
  Administered 2020-01-02 – 2020-01-07 (×5): 17 g via ORAL
  Filled 2020-01-02 (×5): qty 1

## 2020-01-02 MED ORDER — ADULT MULTIVITAMIN W/MINERALS CH
1.0000 | ORAL_TABLET | Freq: Every day | ORAL | Status: DC
  Administered 2020-01-02 – 2020-01-11 (×10): 1 via ORAL
  Filled 2020-01-02 (×10): qty 1

## 2020-01-02 MED ORDER — PREDNISOLONE 5 MG PO TABS
40.0000 mg | ORAL_TABLET | Freq: Every day | ORAL | Status: DC
  Administered 2020-01-02 – 2020-01-10 (×9): 40 mg via ORAL
  Filled 2020-01-02 (×10): qty 8

## 2020-01-02 MED ORDER — POTASSIUM CHLORIDE CRYS ER 20 MEQ PO TBCR
40.0000 meq | EXTENDED_RELEASE_TABLET | Freq: Once | ORAL | Status: AC
  Administered 2020-01-02: 40 meq via ORAL
  Filled 2020-01-02: qty 2

## 2020-01-02 MED ORDER — DOCUSATE SODIUM 100 MG PO CAPS
100.0000 mg | ORAL_CAPSULE | Freq: Every day | ORAL | Status: DC | PRN
  Filled 2020-01-02: qty 1

## 2020-01-02 MED ORDER — FOLIC ACID 1 MG PO TABS
1.0000 mg | ORAL_TABLET | Freq: Every day | ORAL | Status: DC
  Administered 2020-01-02 – 2020-01-11 (×10): 1 mg via ORAL
  Filled 2020-01-02 (×10): qty 1

## 2020-01-02 MED ORDER — LIDOCAINE HCL 1 % IJ SOLN
INTRAMUSCULAR | Status: AC
  Filled 2020-01-02: qty 20

## 2020-01-02 MED ORDER — THIAMINE HCL 100 MG PO TABS
100.0000 mg | ORAL_TABLET | Freq: Every day | ORAL | Status: DC
  Administered 2020-01-02 – 2020-01-11 (×10): 100 mg via ORAL
  Filled 2020-01-02 (×9): qty 1

## 2020-01-02 NOTE — Procedures (Signed)
Ultrasound-guided diagnostic and therapeutic paracentesis performed yielding 1.4 liters of clear, golden yellow fluid. No immediate complications.  A portion of the fluid was submitted to the lab for preordered studies. EBL none.

## 2020-01-02 NOTE — Progress Notes (Signed)
[facility] Progress Note  CC:  EtOH cirrhosis   Subjective: He had epigastric pain at 5 am which lasted for 10 minutes. He had central lower abdominal pain at 7am which lasted for 10 minutes. No pain medications required. No N/V. Urinating dark orange/yellow urine. No BM past 24 hours.  Sister at bed side.   Objective:   ECHO 01/01/2020: In process   Vital signs in last 24 hours: Temp:  [97.7 F (36.5 C)-98.6 F (37 C)] 97.8 F (36.6 C) (05/05 0555) Pulse Rate:  [88-98] 90 (05/05 0555) Resp:  [16-20] 18 (05/05 0555) BP: (103-127)/(56-74) 111/67 (05/05 0555) SpO2:  [94 %-98 %] 94 % (05/05 0555) Weight:  [115.3 kg] 115.3 kg (05/04 1737) Last BM Date: 01/01/20 General:   Alert in NAD.  Eyes: Moderate scleral icterus.  Heart:  RRR, no murmur.  Pulm:  Breath sounds clear throughout.  Abdomen: Soft but mildly distended upper abdomen, small to moderate amount of ascites. No hepatomegaly appreciated. + splenomegaly.  Extremities:  Bilateral LEs with 1+ pitting edema.  Neurologic:  Alert and  oriented x4;  grossly normal neurologically. No asterixis.  Psych:  Alert and cooperative. Normal mood and affect.  Intake/Output from previous day: No intake/output data recorded. Intake/Output this shift: No intake/output data recorded.  Lab Results: Recent Labs    01/01/20 1018 01/02/20 0524  WBC 6.8 7.1  HGB 12.3* 12.0*  HCT 36.1* 34.1*  PLT 51* 54*   BMET Recent Labs    01/01/20 1018 01/02/20 0524  NA 130* 133*  K 3.3* 3.2*  CL 99 100  CO2 25 25  GLUCOSE 89 83  BUN 13 15  CREATININE 0.63 0.80  CALCIUM 7.8* 7.9*   LFT Recent Labs    01/02/20 0524  PROT 6.7  ALBUMIN 1.7*  AST 168*  ALT 71*  ALKPHOS 137*  BILITOT 26.2*   PT/INR Recent Labs    01/01/20 1018 01/02/20 0524  LABPROT 29.8* 32.6*  INR 3.0* 3.3*   Hepatitis Panel No results for input(s): HEPBSAG, HCVAB, HEPAIGM, HEPBIGM in the last 72 hours.  CT ABDOMEN PELVIS W  CONTRAST  Result Date: 01/01/2020 CLINICAL DATA:  Cirrhosis, weight gain, abdominal distension, leg swelling, jaundice, history GERD, hypertension EXAM: CT ABDOMEN AND PELVIS WITH CONTRAST TECHNIQUE: Multidetector CT imaging of the abdomen and pelvis was performed using the standard protocol following bolus administration of intravenous contrast. Sagittal and coronal MPR images reconstructed from axial data set. CONTRAST:  181m OMNIPAQUE IOHEXOL 300 MG/ML SOLN IV. No oral contrast. COMPARISON:  04/05/2016 FINDINGS: Lower chest: Subsegmental atelectasis RIGHT lower lobe. Tiny RIGHT pleural effusion. Hepatobiliary: Dependent calculi within gallbladder. Gallbladder wall appears thickened, nonspecific in the setting of ascites. Heterogeneous cirrhotic appearing liver without discrete mass. No intrahepatic or extrahepatic biliary dilatation. Pancreas: Normal appearance Spleen: Mildly enlarged, 14.6 x 13.6 x 5.3 cm (volume = 550 cm^3). No focal mass. Adrenals/Urinary Tract: Adrenal glands, kidneys, ureters, and bladder normal appearance Stomach/Bowel: Normal appendix. Stomach and bowel loops normal appearance Vascular/Lymphatic: Aorta normal caliber with mild scattered atherosclerotic calcifications. Vascular structures patent. Large perisplenic collateral extending to LEFT renal vein consistent with spontaneous splenorenal shunt. Major vascular structures patent. No adenopathy. Reproductive: Unremarkable seminal vesicles. Prostate gland minimally enlarged at 4.8 x 3.5 cm image 96. Other: Moderate ascites. No free air. No hernia. Scattered edema/infiltration of tissue planes in abdomen. 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 Moderate ascites. Cholelithiasis. Minimal prostatic enlargement. Electronically Signed   By: MElta Guadeloupe  Thornton Papas M.D.   On: 01/01/2020 12:54    Assessment / Plan:  19.  54 year old male with acute alcoholic hepatitis with chronic  alcoholic cirrhosis with ascites. Today MELD 32. Albumin 1.8.  Alk phos 151.  AST 181 -> 168.  ALT 69 -> 71.  Total bili 26 -> 26.2.  PT 29.8 -> 32.6.  INR 3.0 -> 3.3. GGT 117. Discriminant function 101.9 -> 115.  WBC 7.1. Normal BUN/Cr. Urine Na+ < 10. Autoimmune serologies pending. CT abdomen pelvis 01/01/2020 consistent with cirrhosis with moderate ascites, splenomegaly with a spontaneous splenorenal shunt and cholelithiasis. -Sono guided paracentesis, labs to include cell count with diff, gram stain, aerobic and anaerobic cultures, albumin, protein and cytology.  IR to complete paracentesis today with INR 3.3. Patient is aware he is at a higher risk of bleeding/hematoma formation post paracentesis and he consents to proceed with the paracentesis. SBP to be ruled out prior to patient receiving Prednisolone.  -Patient to receive 25 gm of albumin IV after paracentesis completed. -Discriminant function 101.9 on 5/4,  Prednisolone deferred until SBP ruled out -No alcohol ever discussed with patient  -Thiamine 100 mg p.o. daily, folate 1 mg daily and multivitamin daily -No diuretics due to hyponatremia and hypokalemia -? CIWA protocol, no signs of alcohol withdrawl -Ondansetron '4mg'$  po or IV Q 6hrs PRN  2.  Hyponatremia secondary to #1. Na+ 130 -> 133.  -Fluid restriction not advised unless sodium level less than 125  3.  Hypokalemia. K+ 3.2. -KCL Placement per the hospitalist  4.  Macrocytic anemia.  Hemoglobin 12.3 -> 12. (Base line Hg 13.3 on 10/10/2017).  Hematocrit 36.1 -> 34.1.  MCV 108.1. Iron 104.  Ferritin 1692.  Folate 6.9.  Vitamin B12 2818.  5.  Coagulopathy secondary to cirrhosis. INR 3.0 - 3.3. Vitamin K '10mg'$  po x 1 daily x 3 days, first dose received on 5/4.  -Repeat INR in am.  6. 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 PLT 51 -> 54.  7.  Patient reports 4 to 5-year history of black solid stool and intermittent bright red rectal bleeding x 3 occasions over  the past 2 months.  Exam 5/4 showed brown stool grossly heme positive. -EGD to assess for esophageal and gastric varices after INR corrected -Colonoscopy as outpatient after his cirrhosis has stabilized  8. At risk for hepatic encephalopathy in setting or splenorenal shunt. Ammonia 48.  -Monitor neuro status closely -Repeat Ammonia level in am   9. History of colon polyps -See plan in # 7  Further recommendations per Dr. Tarri Glenn     Active Problems:   Cirrhosis ([place])     LOS: 1 day   Jesse Esparza  01/02/2020, 9:23 AM

## 2020-01-02 NOTE — Progress Notes (Signed)
  Echocardiogram 2D Echocardiogram has been performed.  Jesse Esparza 01/02/2020, 8:45 AM

## 2020-01-02 NOTE — Progress Notes (Signed)
[facility]                                                                              Patient Demographics  Jesse Esparza, is a 54 y.o. male, DOB - 06-May-1966, [id number]  Admit date - 01/01/2020   Admitting Physician Emeterio Reeve, DO  Outpatient Primary MD for the patient is Charlott Rakes, MD  Outpatient specialists:   LOS - 1  days   Medical records reviewed and are as summarized below:    Chief Complaint  Patient presents with  . abdominal distention  . Leg Swelling  . Jaundice       Brief summary   Patient is a 54 year old male with history of known cirrhosis, GERD, hyperlipidemia, hypertension, obesity presented with 3 weeks of worsening fatigue, nausea, decreased appetite.  Also reported lower extremity edema, worsening abdominal distention, yellowing of the skin and eyes.  Patient reports he has been consuming 4 to 6 cans of beers per day for years and stopped 2 weeks ago.  Former use of liquor Showed cirrhotic liver with splenomegaly and spontaneous splenorenal shunt, moderate ascites with cholelithiasis. Labs with transaminitis, elevated bili, hypoalbuminemia, hypercalcemia, elevated INR, elevated ammonia, macrocytic anemia   Assessment & Plan    Principal problem   Cirrhosis ([place]) with associated transaminitis, elevated bilirubin, thrombocytopenia, alcoholic hepatitis -Alcohol-related advanced liver disease with heavy daily alcohol use until 2 weeks ago, findings consistent with alcoholic hepatitis -Had an EGD in 2017 which showed no portal hypertension -GI consulted, recommended start prednisolone if no evidence of SBP -Discriminant function 101, follow closely for hepatic encephalopathy, at risk -Currently alert and oriented     Alcoholic hepatitis with ascites - IR consulted for paracentesis, 1.4 liters, removed, cultures pending, studies not consistent with SBP - will defer to GI regarding steroids    - s/p albumin 25g x 1      History of alcohol abuse - counseled on alcohol cessation, continue thiamine and folic acid   Hyponatremia - improving   Hypokalemia - replaced   Macrocytic anemia -Likely likely due to #1, continue thiamine, folate  Thrombocytopenia secondary to cirrhosis with splenomegaly, coagulopathy -Continue to follow counts -Received vitamin K for elevated INR -GI recommended vitamin K 10 mg p.o. daily for 3 days  Intermittent GI bleed -Patient reported history of black solid stool and intermittent bright red blood over the past 2 months -FOBT positive.  Per GI EGD to assess for esophageal and gastric varices after INR is corrected -Colonoscopy outpatient  Obesity Estimated body mass index is 42.28 kg/m as calculated from the following:   Height as of this encounter: 5\' 5"  (1.651 m).   Weight as of this encounter: 115.3 kg.  Code Status: Full code DVT Prophylaxis: SCDs Family Communication: Discussed all imaging results, lab results, explained to the patient and sister at the bedside   Disposition Plan:     Status is: Inpatient  Remains inpatient appropriate because:Inpatient level of care appropriate due to severity of illness   Dispo: The patient is from: Home              Anticipated d/c is to: Home  Anticipated d/c date is: > 3 days              Patient currently is not medically stable to d/c.       Time Spent in minutes   35mins    Procedures:  Paracentesis  Consultants:   Interventional radiology Gastroenterology  Antimicrobials:   Anti-infectives (From admission, onward)   None          Medications  Scheduled Meds: . folic acid  1 mg Oral Daily  . multivitamin with minerals  1 tablet Oral Daily  . phytonadione  10 mg Oral Daily  . pneumococcal 23 valent vaccine  0.5 mL Intramuscular Tomorrow-1000  . thiamine  100 mg Oral Daily   Continuous Infusions: PRN Meds:.      Subjective:   Jesse Esparza was seen and  examined today.  Currently alert and oriented, family member at the bedside.  States intermittent epigastric abdominal pain, nondistended.  Patient denies dizziness, chest pain, shortness of breath, abdominal pain, new weakness, numbess, tingling. No acute events overnight.  No fever  Objective:   Vitals:   01/02/20 1122 01/02/20 1155 01/02/20 1200 01/02/20 1341  BP: 117/69 110/60 105/67 112/67  Pulse:    93  Resp:    16  Temp:    98 F (36.7 C)  TempSrc:    Oral  SpO2:    94%  Weight:      Height:        Intake/Output Summary (Last 24 hours) at 01/02/2020 1519 Last data filed at 01/02/2020 1431 Gross per 24 hour  Intake 240 ml  Output --  Net 240 ml     Wt Readings from Last 3 Encounters:  01/01/20 115.3 kg  11/07/17 99.3 kg  10/10/17 98 kg     Exam  General: Alert and oriented x 3, NAD, scleral icterus  Cardiovascular: S1 S2 auscultated, no murmurs, RRR  Respiratory: Clear to auscultation bilaterally  Gastrointestinal: Soft, nontender, distended, + bowel sounds  Ext: 1+ pedal edema bilaterally  Neuro: No new deficits  Musculoskeletal: No digital cyanosis, clubbing  Skin: No rashes  Psych: Normal affect and demeanor, alert and oriented x3    Data Reviewed:  I have personally reviewed following labs and imaging studies  Micro Results Recent Results (from the past 240 hour(s))  Respiratory Panel by RT PCR (Flu A&B, Covid) - Nasopharyngeal Swab     Status: None   Collection Time: 01/01/20  2:07 PM   Specimen: Nasopharyngeal Swab  Result Value Ref Range Status   SARS Coronavirus 2 by RT PCR NEGATIVE NEGATIVE Final    Comment: (NOTE) SARS-CoV-2 target nucleic acids are NOT DETECTED. The SARS-CoV-2 RNA is generally detectable in upper respiratoy specimens during the acute phase of infection. The lowest concentration of SARS-CoV-2 viral copies this assay can detect is 131 copies/mL. A negative result does not preclude SARS-Cov-2 infection and should not be  used as the sole basis for treatment or other patient management decisions. A negative result may occur with  improper specimen collection/handling, submission of specimen other than nasopharyngeal swab, presence of viral mutation(s) within the areas targeted by this assay, and inadequate number of viral copies (<131 copies/mL). A negative result must be combined with clinical observations, patient history, and epidemiological information. The expected result is Negative. Fact Sheet for Patients:  PinkCheek.be Fact Sheet for Healthcare Providers:  GravelBags.it This test is not yet ap proved or cleared by the [place] and  has been authorized for  detection and/or diagnosis of SARS-CoV-2 by FDA under an Emergency Use Authorization (EUA). This EUA will remain  in effect (meaning this test can be used) for the duration of the COVID-19 declaration under Section 564(b)(1) of the Act, 21 U.S.C. section 360bbb-3(b)(1), unless the authorization is terminated or revoked sooner.    Influenza A by PCR NEGATIVE NEGATIVE Final   Influenza B by PCR NEGATIVE NEGATIVE Final    Comment: (NOTE) The Xpert Xpress SARS-CoV-2/FLU/RSV assay is intended as an aid in  the diagnosis of influenza from Nasopharyngeal swab specimens and  should not be used as a sole basis for treatment. Nasal washings and  aspirates are unacceptable for Xpert Xpress SARS-CoV-2/FLU/RSV  testing. Fact Sheet for Patients: PinkCheek.be Fact Sheet for Healthcare Providers: GravelBags.it This test is not yet approved or cleared by the [place] FDA and  has been authorized for detection and/or diagnosis of SARS-CoV-2 by  FDA under an Emergency Use Authorization (EUA). This EUA will remain  in effect (meaning this test can be used) for the duration of the  Covid-19 declaration under Section 564(b)(1) of the  Act, 21  U.S.C. section 360bbb-3(b)(1), unless the authorization is  terminated or revoked. Performed at [facility], [place] [address] [place], [place] [address]     Radiology Reports CT ABDOMEN PELVIS W CONTRAST  Result Date: 01/01/2020 CLINICAL DATA:  Cirrhosis, weight gain, abdominal distension, leg swelling, jaundice, history GERD, hypertension EXAM: CT ABDOMEN AND PELVIS WITH CONTRAST TECHNIQUE: Multidetector CT imaging of the abdomen and pelvis was performed using the standard protocol following bolus administration of intravenous contrast. Sagittal and coronal MPR images reconstructed from axial data set. CONTRAST:  179mL OMNIPAQUE IOHEXOL 300 MG/ML SOLN IV. No oral contrast. COMPARISON:  04/05/2016 FINDINGS: Lower chest: Subsegmental atelectasis RIGHT lower lobe. Tiny RIGHT pleural effusion. Hepatobiliary: Dependent calculi within gallbladder. Gallbladder wall appears thickened, nonspecific in the setting of ascites. Heterogeneous cirrhotic appearing liver without discrete mass. No intrahepatic or extrahepatic biliary dilatation. Pancreas: Normal appearance Spleen: Mildly enlarged, 14.6 x 13.6 x 5.3 cm (volume = 550 cm^3). No focal mass. Adrenals/Urinary Tract: Adrenal glands, kidneys, ureters, and bladder normal appearance Stomach/Bowel: Normal appendix. Stomach and bowel loops normal appearance Vascular/Lymphatic: Aorta normal caliber with mild scattered atherosclerotic calcifications. Vascular structures patent. Large perisplenic collateral extending to LEFT renal vein consistent with spontaneous splenorenal shunt. Major vascular structures patent. No adenopathy. Reproductive: Unremarkable seminal vesicles. Prostate gland minimally enlarged at 4.8 x 3.5 cm image 96. Other: Moderate ascites. No free air. No hernia. Scattered edema/infiltration of tissue planes in abdomen. Musculoskeletal: Unremarkable IMPRESSION: Cirrhotic liver with splenomegaly and spontaneous splenorenal  shunt. Moderate ascites. Cholelithiasis. Minimal prostatic enlargement. Electronically Signed   By: Lavonia Dana M.D.   On: 01/01/2020 12:54   US Paracentesis  Result Date: 01/02/2020 INDICATION: Patient with history of acute alcoholic hepatitis/chronic alcoholic cirrhosis, ascites; request received for diagnostic and therapeutic paracentesis. EXAM: ULTRASOUND GUIDED DIAGNOSTIC AND THERAPEUTIC PARACENTESIS MEDICATIONS: None COMPLICATIONS: None immediate. PROCEDURE: Informed written consent was obtained from the patient after a discussion of the risks, benefits and alternatives to treatment. A timeout was performed prior to the initiation of the procedure. Initial ultrasound scanning demonstrates a small amount of ascites within the right lower abdominal quadrant. The right lower abdomen was prepped and draped in the usual sterile fashion. 1% lidocaine was used for local anesthesia. Following this, a 19 gauge, 10-cm, Yueh catheter was introduced. An ultrasound image was saved for documentation purposes. The paracentesis was performed. The catheter was  removed and a dressing was applied. The patient tolerated the procedure well without immediate post procedural complication. FINDINGS: A total of approximately 1.4 liters of clear, golden yellow fluid was removed. Samples were sent to the laboratory as requested by the clinical team. IMPRESSION: Successful ultrasound-guided diagnostic and therapeutic paracentesis yielding 1.4 liters of peritoneal fluid. Read by: Rowe Robert, PA-C Electronically Signed   By: Corrie Mckusick D.O.   On: 01/02/2020 12:44   ECHOCARDIOGRAM COMPLETE  Result Date: 01/02/2020    ECHOCARDIOGRAM REPORT   Patient Name:   HELEN BUWALDA Date of Exam: 01/02/2020 Medical Rec #:  [id number]       Height:       65.0 in Accession #:    [id number]      Weight:       254.1 lb Date of Birth:  12-07-1965      BSA:          2.190 m Patient Age:    1 years        BP:           111/67 mmHg Patient Gender: M                HR:           93 bpm. Exam Location:  Inpatient Procedure: 2D Echo, Cardiac Doppler and Color Doppler Indications:    R94.31 Abnormal EKG  History:        Patient has no prior history of Echocardiogram examinations.                 Risk Factors:Hypertension. ETOH. Edema. Ascites.  Sonographer:    Roseanna Rainbow RDCS Referring Phys: [id number] [place] Comments: Suboptimal parasternal window, no subcostal window, Technically difficult study due to poor echo windows and patient is morbidly obese. Image acquisition challenging due to patient body habitus. Study is to rule out right heart failure. IMPRESSIONS  1. Left ventricular ejection fraction, by estimation, is 70 to 75%. The left ventricle has hyperdynamic function. The left ventricle has no regional wall motion abnormalities. There is moderate left ventricular hypertrophy. Left ventricular diastolic parameters are consistent with Grade I diastolic dysfunction (impaired relaxation).  2. Right ventricular systolic function is normal. The right ventricular size is normal.  3. The mitral valve is normal in structure. No evidence of mitral valve regurgitation. No evidence of mitral stenosis.  4. The aortic valve was not well visualized. Aortic valve regurgitation is not visualized. No aortic stenosis is present.  5. The inferior vena cava is normal in size with greater than 50% respiratory variability, suggesting right atrial pressure of 3 mmHg. FINDINGS  Left Ventricle: Left ventricular ejection fraction, by estimation, is 70 to 75%. The left ventricle has hyperdynamic function. The left ventricle has no regional wall motion abnormalities. The left ventricular internal cavity size was normal in size. There is moderate left ventricular hypertrophy. Left ventricular diastolic parameters are consistent with Grade I diastolic dysfunction (impaired relaxation). Right Ventricle: The right ventricular size is normal. No increase in right  ventricular wall thickness. Right ventricular systolic function is normal. Left Atrium: Left atrial size was normal in size. Right Atrium: Right atrial size was normal in size. Pericardium: There is no evidence of pericardial effusion. Mitral Valve: The mitral valve is normal in structure. Normal mobility of the mitral valve leaflets. No evidence of mitral valve regurgitation. No evidence of mitral valve stenosis. Tricuspid Valve: The tricuspid valve is normal in structure. Tricuspid valve regurgitation is  not demonstrated. No evidence of tricuspid stenosis. Aortic Valve: The aortic valve was not well visualized. Aortic valve regurgitation is not visualized. No aortic stenosis is present. Pulmonic Valve: The pulmonic valve was normal in structure. Pulmonic valve regurgitation is not visualized. No evidence of pulmonic stenosis. Aorta: The aortic root is normal in size and structure. Venous: The inferior vena cava is normal in size with greater than 50% respiratory variability, suggesting right atrial pressure of 3 mmHg. IAS/Shunts: No atrial level shunt detected by color flow Doppler.  LEFT VENTRICLE PLAX 2D LVIDd:         4.08 cm     Diastology LVIDs:         2.54 cm     LV e' lateral:   9.79 cm/s LV PW:         1.46 cm     LV E/e' lateral: 9.0 LV IVS:        1.33 cm     LV e' medial:    7.40 cm/s LVOT diam:     2.10 cm     LV E/e' medial:  11.9 LV SV:         80 LV SV Index:   37 LVOT Area:     3.46 cm  LV Volumes (MOD) LV vol d, MOD A2C: 48.6 ml LV vol d, MOD A4C: 68.5 ml LV vol s, MOD A2C: 11.8 ml LV vol s, MOD A4C: 17.0 ml LV SV MOD A2C:     36.8 ml LV SV MOD A4C:     68.5 ml LV SV MOD BP:      45.2 ml RIGHT VENTRICLE RV S prime:     13.40 cm/s TAPSE (M-mode): 2.0 cm LEFT ATRIUM             Index       RIGHT ATRIUM           Index LA diam:        3.70 cm 1.69 cm/m  RA Area:     12.50 cm LA Vol (A2C):   31.2 ml 14.25 ml/m RA Volume:   20.40 ml  9.32 ml/m LA Vol (A4C):   46.5 ml 21.23 ml/m LA Biplane Vol:  40.8 ml 18.63 ml/m  AORTIC VALVE LVOT Vmax:   134.00 cm/s LVOT Vmean:  96.000 cm/s LVOT VTI:    0.231 m  AORTA Ao Root diam: 3.30 cm Ao Asc diam:  3.20 cm MITRAL VALVE MV Area (PHT): 3.31 cm     SHUNTS MV Decel Time: 229 msec     Systemic VTI:  0.23 m MV E velocity: 87.80 cm/s   Systemic Diam: 2.10 cm MV A velocity: 123.00 cm/s MV E/A ratio:  0.71 Candee Furbish MD Electronically signed by Candee Furbish MD Signature Date/Time: 01/02/2020/11:01:13 AM    Final     Lab Data:  CBC: Recent Labs  Lab 01/01/20 1018 01/02/20 0524  WBC 6.8 7.1  NEUTROABS 5.4 5.5  HGB 12.3* 12.0*  HCT 36.1* 34.1*  MCV 108.1* 107.6*  PLT 51* 54*   Basic Metabolic Panel: Recent Labs  Lab 01/01/20 1018 01/02/20 0524  NA 130* 133*  K 3.3* 3.2*  CL 99 100  CO2 25 25  GLUCOSE 89 83  BUN 13 15  CREATININE 0.63 0.80  CALCIUM 7.8* 7.9*   GFR: Estimated Creatinine Clearance: 122.5 mL/min (by C-G formula based on SCr of 0.8 mg/dL). Liver Function Tests: Recent Labs  Lab 01/01/20 1018 01/02/20 0524  AST 181*  168*  ALT 69* 71*  ALKPHOS 151* 137*  BILITOT 26.0* 26.2*  PROT 6.9 6.7  ALBUMIN 1.8* 1.7*   No results for input(s): LIPASE, AMYLASE in the last 168 hours. Recent Labs  Lab 01/01/20 1018  AMMONIA 48*   Coagulation Profile: Recent Labs  Lab 01/01/20 1018 01/02/20 0524  INR 3.0* 3.3*   Cardiac Enzymes: No results for input(s): CKTOTAL, CKMB, CKMBINDEX, TROPONINI in the last 168 hours. BNP (last 3 results) No results for input(s): PROBNP in the last 8760 hours. HbA1C: No results for input(s): HGBA1C in the last 72 hours. CBG: No results for input(s): GLUCAP in the last 168 hours. Lipid Profile: No results for input(s): CHOL, HDL, LDLCALC, TRIG, CHOLHDL, LDLDIRECT in the last 72 hours. Thyroid Function Tests: No results for input(s): TSH, T4TOTAL, FREET4, T3FREE, THYROIDAB in the last 72 hours. Anemia Panel: Recent Labs    01/02/20 0524  VITAMINB12 2,818*  FOLATE 6.9  FERRITIN 1,692*   TIBC 87*  IRON 104   Urine analysis:    Component Value Date/Time   COLORURINE AMBER (A) 01/01/2020 1031   APPEARANCEUR HAZY (A) 01/01/2020 1031   LABSPEC 1.013 01/01/2020 1031   PHURINE 6.0 01/01/2020 1031   GLUCOSEU 50 (A) 01/01/2020 1031   HGBUR SMALL (A) 01/01/2020 1031   BILIRUBINUR MODERATE (A) 01/01/2020 1031   BILIRUBINUR large (A) 03/19/2016 1103   BILIRUBINUR small 12/25/2014 1001   KETONESUR 5 (A) 01/01/2020 1031   PROTEINUR NEGATIVE 01/01/2020 1031   UROBILINOGEN 4.0 03/19/2016 1103   UROBILINOGEN 0.2 01/21/2015 0623   NITRITE NEGATIVE 01/01/2020 1031   LEUKOCYTESUR NEGATIVE 01/01/2020 1031     Dorthie Santini M.D. [facility] 01/02/2020, 3:19 PM   Call night coverage person covering after 7pm

## 2020-01-03 LAB — CBC
HCT: 33.3 % — ABNORMAL LOW (ref 39.0–52.0)
Hemoglobin: 11.4 g/dL — ABNORMAL LOW (ref 13.0–17.0)
MCH: 37 pg — ABNORMAL HIGH (ref 26.0–34.0)
MCHC: 34.2 g/dL (ref 30.0–36.0)
MCV: 108.1 fL — ABNORMAL HIGH (ref 80.0–100.0)
Platelets: 47 10*3/uL — ABNORMAL LOW (ref 150–400)
RBC: 3.08 MIL/uL — ABNORMAL LOW (ref 4.22–5.81)
RDW: 18 % — ABNORMAL HIGH (ref 11.5–15.5)
WBC: 6.4 10*3/uL (ref 4.0–10.5)
nRBC: 0 % (ref 0.0–0.2)

## 2020-01-03 LAB — HCV INTERPRETATION

## 2020-01-03 LAB — ANTI-SMOOTH MUSCLE ANTIBODY, IGG: F-Actin IgG: 21 Units — ABNORMAL HIGH (ref 0–19)

## 2020-01-03 LAB — COMPREHENSIVE METABOLIC PANEL
ALT: 73 U/L — ABNORMAL HIGH (ref 0–44)
AST: 155 U/L — ABNORMAL HIGH (ref 15–41)
Albumin: 2 g/dL — ABNORMAL LOW (ref 3.5–5.0)
Alkaline Phosphatase: 127 U/L — ABNORMAL HIGH (ref 38–126)
Anion gap: 9 (ref 5–15)
BUN: 17 mg/dL (ref 6–20)
CO2: 25 mmol/L (ref 22–32)
Calcium: 8.5 mg/dL — ABNORMAL LOW (ref 8.9–10.3)
Chloride: 102 mmol/L (ref 98–111)
Creatinine, Ser: 0.76 mg/dL (ref 0.61–1.24)
GFR calc Af Amer: >60 mL/min (ref >60)
GFR calc non Af Amer: >60 mL/min (ref >60)
Glucose, Bld: 111 mg/dL — ABNORMAL HIGH (ref 70–99)
Potassium: 4.1 mmol/L (ref 3.5–5.1)
Sodium: 136 mmol/L (ref 135–145)
Total Bilirubin: 29.3 mg/dL (ref 0.3–1.2)
Total Protein: 6.6 g/dL (ref 6.5–8.1)

## 2020-01-03 LAB — PROTIME-INR
INR: 3.2 — ABNORMAL HIGH (ref 0.8–1.2)
Prothrombin Time: 31.5 seconds — ABNORMAL HIGH (ref 11.4–15.2)

## 2020-01-03 LAB — ALPHA-1-ANTITRYPSIN: A-1 Antitrypsin, Ser: 129 mg/dL (ref 101–187)

## 2020-01-03 LAB — CYTOLOGY - NON PAP

## 2020-01-03 LAB — ANA

## 2020-01-03 LAB — HCV AB W REFLEX TO QUANT PCR: HCV Ab: <0.1 s/co ratio (ref 0.0–0.9)

## 2020-01-03 LAB — MITOCHONDRIAL ANTIBODIES: Mitochondrial M2 Ab, IgG: <20 Units (ref 0.0–20.0)

## 2020-01-03 LAB — HEPATITIS B SURFACE ANTIBODY, QUANTITATIVE: Hep B S AB Quant (Post): 13.1 m[IU]/mL (ref >9.9)

## 2020-01-03 LAB — CERULOPLASMIN: Ceruloplasmin: 13.8 mg/dL — ABNORMAL LOW (ref 16.0–31.0)

## 2020-01-03 LAB — IGG: IgG (Immunoglobin G), Serum: 2269 mg/dL — ABNORMAL HIGH (ref 603–1613)

## 2020-01-03 MED ORDER — IBUPROFEN 200 MG PO TABS
600.0000 mg | ORAL_TABLET | Freq: Four times a day (QID) | ORAL | Status: AC | PRN
  Administered 2020-01-03 – 2020-01-06 (×2): 600 mg via ORAL
  Filled 2020-01-03 (×3): qty 3

## 2020-01-03 MED ORDER — SPIRONOLACTONE 25 MG PO TABS
50.0000 mg | ORAL_TABLET | Freq: Every day | ORAL | Status: DC
  Administered 2020-01-03 – 2020-01-04 (×2): 50 mg via ORAL
  Filled 2020-01-03 (×3): qty 2

## 2020-01-03 MED ORDER — LACTULOSE 10 GM/15ML PO SOLN
30.0000 g | Freq: Once | ORAL | Status: AC
  Administered 2020-01-03: 30 g via ORAL
  Filled 2020-01-03: qty 45

## 2020-01-03 MED ORDER — PANTOPRAZOLE SODIUM 40 MG PO TBEC
40.0000 mg | DELAYED_RELEASE_TABLET | Freq: Every day | ORAL | Status: DC
  Administered 2020-01-03 – 2020-01-07 (×5): 40 mg via ORAL
  Filled 2020-01-03 (×5): qty 1

## 2020-01-03 MED ORDER — LACTULOSE 10 GM/15ML PO SOLN: 10.0000 g | Freq: Two times a day (BID) | ORAL | Status: DC

## 2020-01-03 NOTE — Progress Notes (Signed)
[facility]                                                                              Patient Demographics  Jesse Esparza, is a 54 y.o. male, DOB - [birth year], [id number]  Admit date - 01/01/2020   Admitting Physician Emeterio Reeve, DO  Outpatient Primary MD for the patient is Charlott Rakes, MD  Outpatient specialists:   LOS - 2  days   Medical records reviewed and are as summarized below:    Chief Complaint  Patient presents with  . abdominal distention  . Leg Swelling  . Jaundice       Brief summary   Patient is a 54 year old male with history of known cirrhosis, GERD, hyperlipidemia, hypertension, obesity presented with 3 weeks of worsening fatigue, nausea, decreased appetite.  Also reported lower extremity edema, worsening abdominal distention, yellowing of the skin and eyes.  Patient reports he has been consuming 4 to 6 cans of beers per day for years and stopped 2 weeks ago.  Former use of liquor Showed cirrhotic liver with splenomegaly and spontaneous splenorenal shunt, moderate ascites with cholelithiasis. Labs with transaminitis, elevated bili, hypoalbuminemia, hypercalcemia, elevated INR, elevated ammonia, macrocytic anemia   Assessment & Plan    Principal problem   Cirrhosis ([place]) with associated transaminitis, elevated bilirubin, thrombocytopenia, alcoholic hepatitis -Alcohol-related advanced liver disease with heavy daily alcohol use until 2 weeks ago, findings consistent with alcoholic hepatitis -Had an EGD in 2017 which showed no portal hypertension -Status post paracentesis on 5/5, not consistent with SBP -Discriminant function 101, follow closely for hepatic encephalopathy, at risk -Started on prednisolone 40 mg daily -Started on spironolactone today, follow electrolytes.  If tolerated with BP and renal function, will add Lasix -States he feels constipated, started on lactulose 30 mg today, states no BM in the last 2 days.   Will continue 10 mg twice daily Lasix from tomorrow -Total bili still trending high, [id number]    Alcoholic hepatitis with ascites -Status post paracentesis on 5/5, 1.4 L removed. 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 daily for 3 days  Intermittent GI bleed -Patient reported history of black solid stool and intermittent bright red blood over the past 2 months -FOBT positive.  Per GI EGD to assess for esophageal and gastric varices after INR is corrected -Colonoscopy outpatient  Obesity Estimated body mass index is 42.28 kg/m as calculated from the following:   Height as of this encounter: 5\' 5"  (1.651 m).   Weight as of this encounter: 115.3 kg.  Code Status: Full code DVT Prophylaxis: SCDs Family Communication: Discussed all imaging results, lab results, explained to the patient in detail.   Disposition Plan:     Status is: Inpatient  Remains inpatient appropriate because:Inpatient level of care appropriate due to severity of illness  Dispo: The patient is from: Home              Anticipated d/c is to: Home              Anticipated d/c date is: > 3 days              Patient currently is not medically stable to d/c.       Time Spent in minutes   22mins    Procedures:  Paracentesis  Consultants:   Interventional radiology Gastroenterology  Antimicrobials:   Anti-infectives (From admission, onward)   None         Medications  Scheduled Meds: . folic acid  1 mg Oral Daily  . multivitamin with minerals  1 tablet Oral Daily  . pantoprazole  40 mg Oral Daily  . polyethylene glycol  17  g Oral Daily  . prednisoLONE  40 mg Oral Daily  . spironolactone  50 mg Oral Daily  . thiamine  100 mg Oral Daily   Continuous Infusions: PRN Meds:.      Subjective:   Jesse Esparza was seen and examined today.  Currently alert and oriented, states feels better after paracentesis however constipated for last 2 days.  No abdominal pain.  Occasionally has hiccups. Patient denies dizziness, chest pain, shortness of breath, new weakness, numbess, tingling. No acute events overnight.  No fevers.  Objective:   Vitals:   01/02/20 1200 01/02/20 1341 01/02/20 2006 01/03/20 0554  BP: 105/67 112/67 115/66 116/76  Pulse:  93 90 85  Resp:  16 18 18   Temp:  98 F (36.7 C)  98.1 F (36.7 C)  TempSrc:  Oral    SpO2:  94% 94% 95%  Weight:      Height:        Intake/Output Summary (Last 24 hours) at 01/03/2020 1214 Last data filed at 01/03/2020 0900 Gross per 24 hour  Intake 720 ml  Output --  Net 720 ml     Wt Readings from Last 3 Encounters:  01/01/20 115.3 kg  11/07/17 99.3 kg  10/10/17 98 kg    Physical Exam  General: Alert and oriented x 3, NAD, scleral icterus  Cardiovascular: S1 S2 clear, RRR. 1+ pedal edema b/l  Respiratory: CTAB, no wheezing, rales or rhonchi  Gastrointestinal: Soft, nontender, distended, NBS  Ext: 1+ pedal edema bilaterally  Neuro: no new deficits  Musculoskeletal: No cyanosis, clubbing  Skin: No rashes  Psych: Normal affect and demeanor, alert and oriented x3    Data Reviewed:  I have personally reviewed following labs and imaging studies  Micro Results Recent Results (from the past 240 hour(s))  Respiratory Panel by RT PCR (Flu A&B, Covid) - Nasopharyngeal Swab     Status: None   Collection Time: 01/01/20  2:07 PM   Specimen: Nasopharyngeal Swab  Result Value Ref Range Status   SARS Coronavirus 2 by RT PCR NEGATIVE NEGATIVE Final    Comment: (NOTE) SARS-CoV-2 target nucleic acids are NOT DETECTED. The SARS-CoV-2 RNA is generally  detectable in upper respiratoy specimens during the acute phase of infection. The lowest concentration of SARS-CoV-2 viral copies this assay can detect is 131 copies/mL. A negative result does not preclude SARS-Cov-2 infection and should not be used as the sole basis for treatment or other patient management decisions. A negative result may occur with  improper specimen collection/handling, submission of specimen other than nasopharyngeal swab, presence of viral mutation(s) within the areas targeted by this assay, and inadequate  number of viral copies (<131 copies/mL). A negative result must be combined with clinical observations, patient history, and epidemiological information. The expected result is Negative. Fact Sheet for Patients:  PinkCheek.be Fact Sheet for Healthcare Providers:  GravelBags.it This test is not yet ap proved or cleared by the [place] FDA and  has been authorized for detection and/or diagnosis of SARS-CoV-2 by FDA under an Emergency Use Authorization (EUA). This EUA will remain  in effect (meaning this test can be used) for the duration of the COVID-19 declaration under Section 564(b)(1) of the Act, 21 U.S.C. section 360bbb-3(b)(1), unless the authorization is terminated or revoked sooner.    Influenza A by PCR NEGATIVE NEGATIVE Final   Influenza B by PCR NEGATIVE NEGATIVE Final    Comment: (NOTE) The Xpert Xpress SARS-CoV-2/FLU/RSV assay is intended as an aid in  the diagnosis of influenza from Nasopharyngeal swab specimens and  should not be used as a sole basis for treatment. Nasal washings and  aspirates are unacceptable for Xpert Xpress SARS-CoV-2/FLU/RSV  testing. Fact Sheet for Patients: PinkCheek.be Fact Sheet for Healthcare Providers: GravelBags.it This test is not yet approved or cleared by the [place] FDA and  has been  authorized for detection and/or diagnosis of SARS-CoV-2 by  FDA under an Emergency Use Authorization (EUA). This EUA will remain  in effect (meaning this test can be used) for the duration of the  Covid-19 declaration under Section 564(b)(1) of the Act, 21  U.S.C. section 360bbb-3(b)(1), unless the authorization is  terminated or revoked. Performed at [facility], [place] [address], [place], [place] [address]   Culture, body fluid-bottle     Status: None (Preliminary result)   Collection Time: 01/02/20 12:36 PM   Specimen: Fluid  Result Value Ref Range Status   Specimen Description FLUID PERITONEAL  Final   Special Requests BOTTLES DRAWN AEROBIC AND ANAEROBIC 10CC  Final   Culture   Final    NO GROWTH < 24 HOURS Performed at [facility], [place] [address], [place], [place] [address]    Report Status PENDING  Incomplete  Gram stain     Status: None   Collection Time: 01/02/20 12:36 PM   Specimen: Fluid  Result Value Ref Range Status   Specimen Description FLUID PERITONEAL  Final   Special Requests NONE  Final   Gram Stain   Final    FEW WBC PRESENT, PREDOMINANTLY MONONUCLEAR NO ORGANISMS SEEN Performed at [facility], [address], [place], [place] [address]    Report Status 01/02/2020 FINAL  Final    Radiology Reports CT ABDOMEN PELVIS W CONTRAST  Result Date: 01/01/2020 CLINICAL DATA:  Cirrhosis, weight gain, abdominal distension, leg swelling, jaundice, history GERD, hypertension EXAM: CT ABDOMEN AND PELVIS WITH CONTRAST TECHNIQUE: Multidetector CT imaging of the abdomen and pelvis was performed using the standard protocol following bolus administration of intravenous contrast. Sagittal and coronal MPR images reconstructed from axial data set. CONTRAST:  171mL OMNIPAQUE IOHEXOL 300 MG/ML SOLN IV. No oral contrast. COMPARISON:  04/05/2016 FINDINGS: Lower chest: Subsegmental atelectasis RIGHT lower lobe. Tiny RIGHT pleural effusion. Hepatobiliary:  Dependent calculi within gallbladder. Gallbladder wall appears thickened, nonspecific in the setting of ascites. Heterogeneous cirrhotic appearing liver without discrete mass. No intrahepatic or extrahepatic biliary dilatation. Pancreas: Normal appearance Spleen: Mildly enlarged, 14.6 x 13.6 x 5.3 cm (volume = 550 cm^3). No focal mass. Adrenals/Urinary Tract: Adrenal glands, kidneys, ureters, and bladder normal appearance Stomach/Bowel: Normal appendix. Stomach and bowel loops  normal appearance Vascular/Lymphatic: Aorta normal caliber with mild scattered atherosclerotic calcifications. Vascular structures patent. Large perisplenic collateral extending to LEFT renal vein consistent with spontaneous splenorenal shunt. Major vascular structures patent. No adenopathy. Reproductive: Unremarkable seminal vesicles. Prostate gland minimally enlarged at 4.8 x 3.5 cm image 96. Other: Moderate ascites. No free air. No hernia. Scattered edema/infiltration of tissue planes in abdomen. 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 Moderate ascites. Cholelithiasis. Minimal prostatic enlargement. Electronically Signed   By: Lavonia Dana M.D.   On: 01/01/2020 12:54   US Paracentesis  Result Date: 01/02/2020 INDICATION: Patient with history of acute alcoholic hepatitis/chronic alcoholic cirrhosis, ascites; request received for diagnostic and therapeutic paracentesis. EXAM: ULTRASOUND GUIDED DIAGNOSTIC AND THERAPEUTIC PARACENTESIS MEDICATIONS: None COMPLICATIONS: None immediate. PROCEDURE: Informed written consent was obtained from the patient after a discussion of the risks, benefits and alternatives to treatment. A timeout was performed prior to the initiation of the procedure. Initial ultrasound scanning demonstrates a small amount of ascites within the right lower abdominal quadrant. The right lower abdomen was prepped and draped in the usual sterile fashion. 1%  lidocaine was used for local anesthesia. Following this, a 19 gauge, 10-cm, Yueh catheter was introduced. An ultrasound image was saved for documentation purposes. The paracentesis was performed. The catheter was removed and a dressing was applied. The patient tolerated the procedure well without immediate post procedural complication. FINDINGS: A total of approximately 1.4 liters of clear, golden yellow fluid was removed. Samples were sent to the laboratory as requested by the clinical team. IMPRESSION: Successful ultrasound-guided diagnostic and therapeutic paracentesis yielding 1.4 liters of peritoneal fluid. Read by: Rowe Robert, PA-C Electronically Signed   By: Corrie Mckusick D.O.   On: 01/02/2020 12:44   ECHOCARDIOGRAM COMPLETE  Result Date: 01/02/2020    ECHOCARDIOGRAM REPORT   Patient Name:   Jesse Esparza Date of Exam: 01/02/2020 Medical Rec #:  [id number]       Height:       65.0 in Accession #:    [id number]      Weight:       254.1 lb Date of Birth:  1966-05-28      BSA:          2.190 m Patient Age:    25 years        BP:           111/67 mmHg Patient Gender: M               HR:           93 bpm. Exam Location:  Inpatient Procedure: 2D Echo, Cardiac Doppler and Color Doppler Indications:    R94.31 Abnormal EKG  History:        Patient has no prior history of Echocardiogram examinations.                 Risk Factors:Hypertension. ETOH. Edema. Ascites.  Sonographer:    Roseanna Rainbow RDCS Referring Phys: [id number] [place] Comments: Suboptimal parasternal window, no subcostal window, Technically difficult study due to poor echo windows and patient is morbidly obese. Image acquisition challenging due to patient body habitus. Study is to rule out right heart failure. IMPRESSIONS  1. Left ventricular ejection fraction, by estimation, is 70 to 75%. The left ventricle has hyperdynamic function. The left ventricle has no regional wall motion abnormalities. There is moderate left  ventricular hypertrophy. Left ventricular diastolic parameters are consistent with Grade I diastolic dysfunction (impaired relaxation).  2. Right ventricular systolic function is normal. The right ventricular size is normal.  3. The mitral valve is normal in structure. No evidence of mitral valve regurgitation. No evidence of mitral stenosis.  4. The aortic valve was not well visualized. Aortic valve regurgitation is not visualized. No aortic stenosis is present.  5. The inferior vena cava is normal in size with greater than 50% respiratory variability, suggesting right atrial pressure of 3 mmHg. FINDINGS  Left Ventricle: Left ventricular ejection fraction, by estimation, is 70 to 75%. The left ventricle has hyperdynamic function. The left ventricle has no regional wall motion abnormalities. The left ventricular internal cavity size was normal in size. There is moderate left ventricular hypertrophy. Left ventricular diastolic parameters are consistent with Grade I diastolic dysfunction (impaired relaxation). Right Ventricle: The right ventricular size is normal. No increase in right ventricular wall thickness. Right ventricular systolic function is normal. Left Atrium: Left atrial size was normal in size. Right Atrium: Right atrial size was normal in size. Pericardium: There is no evidence of pericardial effusion. Mitral Valve: The mitral valve is normal in structure. Normal mobility of the mitral valve leaflets. No evidence of mitral valve regurgitation. No evidence of mitral valve stenosis. Tricuspid Valve: The tricuspid valve is normal in structure. Tricuspid valve regurgitation is not demonstrated. No evidence of tricuspid stenosis. Aortic Valve: The aortic valve was not well visualized. Aortic valve regurgitation is not visualized. No aortic stenosis is present. Pulmonic Valve: The pulmonic valve was normal in structure. Pulmonic valve regurgitation is not visualized. No evidence of pulmonic stenosis. Aorta:  The aortic root is normal in size and structure. Venous: The inferior vena cava is normal in size with greater than 50% respiratory variability, suggesting right atrial pressure of 3 mmHg. IAS/Shunts: No atrial level shunt detected by color flow Doppler.  LEFT VENTRICLE PLAX 2D LVIDd:         4.08 cm     Diastology LVIDs:         2.54 cm     LV e' lateral:   9.79 cm/s LV PW:         1.46 cm     LV E/e' lateral: 9.0 LV IVS:        1.33 cm     LV e' medial:    7.40 cm/s LVOT diam:     2.10 cm     LV E/e' medial:  11.9 LV SV:         80 LV SV Index:   37 LVOT Area:     3.46 cm  LV Volumes (MOD) LV vol d, MOD A2C: 48.6 ml LV vol d, MOD A4C: 68.5 ml LV vol s, MOD A2C: 11.8 ml LV vol s, MOD A4C: 17.0 ml LV SV MOD A2C:     36.8 ml LV SV MOD A4C:     68.5 ml LV SV MOD BP:      45.2 ml RIGHT VENTRICLE RV S prime:     13.40 cm/s TAPSE (M-mode): 2.0 cm LEFT ATRIUM             Index       RIGHT ATRIUM           Index LA diam:        3.70 cm 1.69 cm/m  RA Area:     12.50 cm LA Vol (A2C):   31.2 ml 14.25 ml/m RA Volume:   20.40 ml  9.32 ml/m LA Vol (A4C):   46.5 ml 21.23  ml/m LA Biplane Vol: 40.8 ml 18.63 ml/m  AORTIC VALVE LVOT Vmax:   134.00 cm/s LVOT Vmean:  96.000 cm/s LVOT VTI:    0.231 m  AORTA Ao Root diam: 3.30 cm Ao Asc diam:  3.20 cm MITRAL VALVE MV Area (PHT): 3.31 cm     SHUNTS MV Decel Time: 229 msec     Systemic VTI:  0.23 m MV E velocity: 87.80 cm/s   Systemic Diam: 2.10 cm MV A velocity: 123.00 cm/s MV E/A ratio:  0.71 Candee Furbish MD Electronically signed by Candee Furbish MD Signature Date/Time: 01/02/2020/11:01:13 AM    Final     Lab Data:  CBC: Recent Labs  Lab 01/01/20 1018 01/02/20 0524 01/03/20 0519  WBC 6.8 7.1 6.4  NEUTROABS 5.4 5.5  --   HGB 12.3* 12.0* 11.4*  HCT 36.1* 34.1* 33.3*  MCV 108.1* 107.6* 108.1*  PLT 51* 54* 47*   Basic Metabolic Panel: Recent Labs  Lab 01/01/20 1018 01/02/20 0524 01/03/20 0519  NA 130* 133* 136  K 3.3* 3.2* 4.1  CL 99 100 102  CO2 25 25 25     GLUCOSE 89 83 111*  BUN 13 15 17   CREATININE 0.63 0.80 0.76  CALCIUM 7.8* 7.9* 8.5*   GFR: Estimated Creatinine Clearance: 122.5 mL/min (by C-G formula based on SCr of 0.76 mg/dL). Liver Function Tests: Recent Labs  Lab 01/01/20 1018 01/02/20 0524 01/03/20 0519  AST 181* 168* 155*  ALT 69* 71* 73*  ALKPHOS 151* 137* 127*  BILITOT 26.0* 26.2* 29.3*  PROT 6.9 6.7 6.6  ALBUMIN 1.8* 1.7* 2.0*   No results for input(s): LIPASE, AMYLASE in the last 168 hours. Recent Labs  Lab 01/01/20 1018  AMMONIA 48*   Coagulation Profile: Recent Labs  Lab 01/01/20 1018 01/02/20 0524 01/03/20 0519  INR 3.0* 3.3* 3.2*   Cardiac Enzymes: No results for input(s): CKTOTAL, CKMB, CKMBINDEX, TROPONINI in the last 168 hours. BNP (last 3 results) No results for input(s): PROBNP in the last 8760 hours. HbA1C: No results for input(s): HGBA1C in the last 72 hours. CBG: No results for input(s): GLUCAP in the last 168 hours. Lipid Profile: No results for input(s): CHOL, HDL, LDLCALC, TRIG, CHOLHDL, LDLDIRECT in the last 72 hours. Thyroid Function Tests: No results for input(s): TSH, T4TOTAL, FREET4, T3FREE, THYROIDAB in the last 72 hours. Anemia Panel: Recent Labs    01/02/20 0524  VITAMINB12 2,818*  FOLATE 6.9  FERRITIN 1,692*  TIBC 87*  IRON 104   Urine analysis:    Component Value Date/Time   COLORURINE AMBER (A) 01/01/2020 1031   APPEARANCEUR HAZY (A) 01/01/2020 1031   LABSPEC 1.013 01/01/2020 1031   PHURINE 6.0 01/01/2020 1031   GLUCOSEU 50 (A) 01/01/2020 1031   HGBUR SMALL (A) 01/01/2020 1031   BILIRUBINUR MODERATE (A) 01/01/2020 1031   BILIRUBINUR large (A) 03/19/2016 1103   BILIRUBINUR small 12/25/2014 1001   KETONESUR 5 (A) 01/01/2020 1031   PROTEINUR NEGATIVE 01/01/2020 1031   UROBILINOGEN 4.0 03/19/2016 1103   UROBILINOGEN 0.2 01/21/2015 0623   NITRITE NEGATIVE 01/01/2020 1031   LEUKOCYTESUR NEGATIVE 01/01/2020 1031     Eulalie Speights M.D. [facility] 01/03/2020, 12:14 PM   Call night coverage person covering after 7pm

## 2020-01-03 NOTE — Progress Notes (Signed)
Gastroenterology Progress Note  CC: alcoholic hepatitis, EtOH cirrhosis  Subjective:  He complains of having mild central abdominal pain when eating. No N/V. No BM. He took Miralax this am. He feels a little confused at times. Lactulose 30gm po x 1 given this am. No other complaints at this time.  No family at the bed side.   Objective:   Paracentesis 01/02/2020: 1.4 L of clear golden yellow fluid removed.  No evidence of SBP. SAAG < 1.1  Ascitic protein > 2.5. Urine sodium < 10. Normal BUN/Cr. Urine protein negative.   ECHO 01/02/2020: 1. Left ventricular ejection fraction, by estimation, is 70 to 75%. The left ventricle has hyperdynamic function. The left ventricle has no regional wall motion abnormalities. There is moderate left ventricular hypertrophy. Left ventricular diastolic parameters are consistent with Grade I diastolic dysfunction (impaired relaxation). 2. Right ventricular systolic function is normal. The right ventricular size is normal. 3. The mitral valve is normal in structure. No evidence of mitral valve regurgitation. No evidence of mitral stenosis. 4. The aortic valve was not well visualized. Aortic valve regurgitation is not visualized. No aortic stenosis is present. 5. The inferior vena cava is normal in size with greater than 50% respiratory variability, suggesting right atrial pressure of 3 mmHg.   Vital signs in last 24 hours: Temp:  [98 F (36.7 C)-98.1 F (36.7 C)] 98.1 F (36.7 C) (05/06 0554) Pulse Rate:  [85-93] 85 (05/06 0554) Resp:  [16-18] 18 (05/06 0554) BP: (105-117)/(60-76) 116/76 (05/06 0554) SpO2:  [94 %-95 %] 95 % (05/06 0554) Last BM Date: 01/01/20 General:   Alert in NAD Eyes: Moderate scleral icterus.  Heart: RRR, no murmur.  Pulm:  Breath sounds clear throughout.  Abdomen: Protuberant, soft, less distended since paracentesis yesterday. No hepatomegaly. + Splenomegaly. + BS x 4 quads.  Extremities:  1+ LE  edema. Neurologic:  Alert and  oriented x4;  grossly normal neurologically. Psych:  Alert and cooperative. Normal mood and affect.  Intake/Output from previous day: 05/05 0701 - 05/06 0700 In: 480 [P.O.:480] Out: -  Intake/Output this shift: No intake/output data recorded.  Lab Results: Recent Labs    01/01/20 1018 01/02/20 0524 01/03/20 0519  WBC 6.8 7.1 6.4  HGB 12.3* 12.0* 11.4*  HCT 36.1* 34.1* 33.3*  PLT 51* 54* 47*   BMET Recent Labs    01/01/20 1018 01/02/20 0524 01/03/20 0519  NA 130* 133* 136  K 3.3* 3.2* 4.1  CL 99 100 102  CO2 '25 25 25  '$ GLUCOSE 89 83 111*  BUN '13 15 17  '$ CREATININE 0.63 0.80 0.76  CALCIUM 7.8* 7.9* 8.5*   LFT Recent Labs    01/03/20 0519  PROT 6.6  ALBUMIN 2.0*  AST 155*  ALT 73*  ALKPHOS 127*  BILITOT 29.3*   PT/INR Recent Labs    01/02/20 0524 01/03/20 0519  LABPROT 32.6* 31.5*  INR 3.3* 3.2*   Hepatitis Panel Recent Labs    01/02/20 0524  HEPBSAG NON REACTIVE  HCVAB NON REACTIVE  HEPBIGM NON REACTIVE    CT ABDOMEN PELVIS W CONTRAST  Result Date: 01/01/2020 CLINICAL DATA:  Cirrhosis, weight gain, abdominal distension, leg swelling, jaundice, history GERD, hypertension EXAM: CT ABDOMEN AND PELVIS WITH CONTRAST TECHNIQUE: Multidetector CT imaging of the abdomen and pelvis was performed using the standard protocol following bolus administration of intravenous contrast. Sagittal and coronal MPR images reconstructed from axial data set. CONTRAST:  125m OMNIPAQUE IOHEXOL 300 MG/ML SOLN IV. No oral  contrast. COMPARISON:  04/05/2016 FINDINGS: Lower chest: Subsegmental atelectasis RIGHT lower lobe. Tiny RIGHT pleural effusion. Hepatobiliary: Dependent calculi within gallbladder. Gallbladder wall appears thickened, nonspecific in the setting of ascites. Heterogeneous cirrhotic appearing liver without discrete mass. No intrahepatic or extrahepatic biliary dilatation. Pancreas: Normal appearance Spleen: Mildly enlarged, 14.6 x 13.6 x  5.3 cm (volume = 550 cm^3). No focal mass. Adrenals/Urinary Tract: Adrenal glands, kidneys, ureters, and bladder normal appearance Stomach/Bowel: Normal appendix. Stomach and bowel loops normal appearance Vascular/Lymphatic: Aorta normal caliber with mild scattered atherosclerotic calcifications. Vascular structures patent. Large perisplenic collateral extending to LEFT renal vein consistent with spontaneous splenorenal shunt. Major vascular structures patent. No adenopathy. Reproductive: Unremarkable seminal vesicles. Prostate gland minimally enlarged at 4.8 x 3.5 cm image 96. Other: Moderate ascites. No free air. No hernia. Scattered edema/infiltration of tissue planes in abdomen. 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 Moderate ascites. Cholelithiasis. Minimal prostatic enlargement. Electronically Signed   By: Lavonia Dana M.D.   On: 01/01/2020 12:54   US Paracentesis  Result Date: 01/02/2020 INDICATION: Patient with history of acute alcoholic hepatitis/chronic alcoholic cirrhosis, ascites; request received for diagnostic and therapeutic paracentesis. EXAM: ULTRASOUND GUIDED DIAGNOSTIC AND THERAPEUTIC PARACENTESIS MEDICATIONS: None COMPLICATIONS: None immediate. PROCEDURE: Informed written consent was obtained from the patient after a discussion of the risks, benefits and alternatives to treatment. A timeout was performed prior to the initiation of the procedure. Initial ultrasound scanning demonstrates a small amount of ascites within the right lower abdominal quadrant. The right lower abdomen was prepped and draped in the usual sterile fashion. 1% lidocaine was used for local anesthesia. Following this, a 19 gauge, 10-cm, Yueh catheter was introduced. An ultrasound image was saved for documentation purposes. The paracentesis was performed. The catheter was removed and a dressing was applied. The patient tolerated the procedure well without  immediate post procedural complication. FINDINGS: A total of approximately 1.4 liters of clear, golden yellow fluid was removed. Samples were sent to the laboratory as requested by the clinical team. IMPRESSION: Successful ultrasound-guided diagnostic and therapeutic paracentesis yielding 1.4 liters of peritoneal fluid. Read by: Rowe Robert, PA-C Electronically Signed   By: Corrie Mckusick D.O.   On: 01/02/2020 12:44   ECHOCARDIOGRAM COMPLETE  Result Date: 01/02/2020    ECHOCARDIOGRAM REPORT   Patient Name:   JAD JOHANSSON Date of Exam: 01/02/2020 Medical Rec #:  [id number]       Height:       65.0 in Accession #:    [id number]      Weight:       254.1 lb Date of Birth:  26-Jan-1966      BSA:          2.190 m Patient Age:    47 years        BP:           111/67 mmHg Patient Gender: M               HR:           93 bpm. Exam Location:  Inpatient Procedure: 2D Echo, Cardiac Doppler and Color Doppler Indications:    R94.31 Abnormal EKG  History:        Patient has no prior history of Echocardiogram examinations.                 Risk Factors:Hypertension. ETOH. Edema. Ascites.  Sonographer:    Roseanna Rainbow RDCS Referring Phys: [id number] [place]  Sonographer Comments: Suboptimal parasternal  window, no subcostal window, Technically difficult study due to poor echo windows and patient is morbidly obese. Image acquisition challenging due to patient body habitus. Study is to rule out right heart failure. IMPRESSIONS  1. Left ventricular ejection fraction, by estimation, is 70 to 75%. The left ventricle has hyperdynamic function. The left ventricle has no regional wall motion abnormalities. There is moderate left ventricular hypertrophy. Left ventricular diastolic parameters are consistent with Grade I diastolic dysfunction (impaired relaxation).  2. Right ventricular systolic function is normal. The right ventricular size is normal.  3. The mitral valve is normal in structure. No evidence of mitral valve  regurgitation. No evidence of mitral stenosis.  4. The aortic valve was not well visualized. Aortic valve regurgitation is not visualized. No aortic stenosis is present.  5. The inferior vena cava is normal in size with greater than 50% respiratory variability, suggesting right atrial pressure of 3 mmHg. FINDINGS  Left Ventricle: Left ventricular ejection fraction, by estimation, is 70 to 75%. The left ventricle has hyperdynamic function. The left ventricle has no regional wall motion abnormalities. The left ventricular internal cavity size was normal in size. There is moderate left ventricular hypertrophy. Left ventricular diastolic parameters are consistent with Grade I diastolic dysfunction (impaired relaxation). Right Ventricle: The right ventricular size is normal. No increase in right ventricular wall thickness. Right ventricular systolic function is normal. Left Atrium: Left atrial size was normal in size. Right Atrium: Right atrial size was normal in size. Pericardium: There is no evidence of pericardial effusion. Mitral Valve: The mitral valve is normal in structure. Normal mobility of the mitral valve leaflets. No evidence of mitral valve regurgitation. No evidence of mitral valve stenosis. Tricuspid Valve: The tricuspid valve is normal in structure. Tricuspid valve regurgitation is not demonstrated. No evidence of tricuspid stenosis. Aortic Valve: The aortic valve was not well visualized. Aortic valve regurgitation is not visualized. No aortic stenosis is present. Pulmonic Valve: The pulmonic valve was normal in structure. Pulmonic valve regurgitation is not visualized. No evidence of pulmonic stenosis. Aorta: The aortic root is normal in size and structure. Venous: The inferior vena cava is normal in size with greater than 50% respiratory variability, suggesting right atrial pressure of 3 mmHg. IAS/Shunts: No atrial level shunt detected by color flow Doppler.  LEFT VENTRICLE PLAX 2D LVIDd:         4.08  cm     Diastology LVIDs:         2.54 cm     LV e' lateral:   9.79 cm/s LV PW:         1.46 cm     LV E/e' lateral: 9.0 LV IVS:        1.33 cm     LV e' medial:    7.40 cm/s LVOT diam:     2.10 cm     LV E/e' medial:  11.9 LV SV:         80 LV SV Index:   37 LVOT Area:     3.46 cm  LV Volumes (MOD) LV vol d, MOD A2C: 48.6 ml LV vol d, MOD A4C: 68.5 ml LV vol s, MOD A2C: 11.8 ml LV vol s, MOD A4C: 17.0 ml LV SV MOD A2C:     36.8 ml LV SV MOD A4C:     68.5 ml LV SV MOD BP:      45.2 ml RIGHT VENTRICLE RV S prime:     13.40 cm/s TAPSE (M-mode): 2.0 cm  LEFT ATRIUM             Index       RIGHT ATRIUM           Index LA diam:        3.70 cm 1.69 cm/m  RA Area:     12.50 cm LA Vol (A2C):   31.2 ml 14.25 ml/m RA Volume:   20.40 ml  9.32 ml/m LA Vol (A4C):   46.5 ml 21.23 ml/m LA Biplane Vol: 40.8 ml 18.63 ml/m  AORTIC VALVE LVOT Vmax:   134.00 cm/s LVOT Vmean:  96.000 cm/s LVOT VTI:    0.231 m  AORTA Ao Root diam: 3.30 cm Ao Asc diam:  3.20 cm MITRAL VALVE MV Area (PHT): 3.31 cm     SHUNTS MV Decel Time: 229 msec     Systemic VTI:  0.23 m MV E velocity: 87.80 cm/s   Systemic Diam: 2.10 cm MV A velocity: 123.00 cm/s MV E/A ratio:  0.71 Candee Furbish MD Electronically signed by Candee Furbish MD Signature Date/Time: 01/02/2020/11:01:13 AM    Final     Assessment / Plan:    42. 54 year old male with acute alcoholichepatitis with chronicalcoholic cirrhosis with ascites.  MELD 32.  CTAP 01/01/2020 consistent with cirrhosis with moderate ascites, splenomegaly with a spontaneous splenorenal shunt and cholelithiasis. Discriminant function 101.9 -> 115. Prednisolone 40 mg p.o. daily started on 5/5. S/P paracentesis 5/5 with 1.4 L of clear golden yellow fluid removed.  No evidence of SBP. SAAG < 1.1  Ascitic protein > 2.5.  Echo 5/5 normal RV function, LVEF 70 to 75% and grade 1 diastolic dysfunction.  Urine sodium < 10. Normal BUN/Cr.  Low ceruloplasmin level 13.8 (most likely due to alcoholic liver disease and unlikely  Wilson's dz).  A1AT 129.  Acute hepatitis panel negative. GGT 117. IgG grossly elevated 2,269. ANA and SMA pending. Iron 104.Today alk phos 127. AST 155.  ALT is 73.  Total bili 29.3 up from 26.2.  Albumin 2.0. He complains of mild central abdominal burning discomfort after eating. He is not a liver transplant candidate due to alcohol abuse. He intends to enter inpatient rehab after hospital discharge.  -Continue Prednisolone 40 mg daily, check [place] score on day 7 -Spironolactone 50 mg 1 p.o. daily -Monitor BMP and hepatic panel daily -Ondansetron '4mg'$  po or IV Q 6hrs PRN -Pantoprazole '40mg'$  po QD -2 g low-sodium diet  2. Hyponatremia secondary to #1. Na+ 130 -> 133 -> 136 -Fluid restriction notadvised unless sodium level less than 125  3. Hypokalemia, resolved  4. Macrocytic anemia. Hemoglobin 12.3 -> 12 ->11.4.. (Base line Hg 13.3 on 10/10/2017).Hematocrit 36.1 -> 34.1. MCV 108.1. Iron 104.  Ferritin 1692.  Folate 6.9.  Vitamin B12 2818.  5. Coagulopathy secondary to cirrhosis. INR 3.0 - 3.3 -> today 3.2. Vitamin K '10mg'$  po x 1 daily x 3 days, first dose received on 5/4.  -Repeat INR in am.  6. 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 PLT 51 -> 54 -> 47.  7.Patient reports 4 to 5-year history of black solid stool and intermittent bright red rectal bleeding x 3 occasions over the past 2 months. Exam 5/4 showed brown stool grossly heme positive. -EGD to assess for esophageal and gastric varicesafter INR corrected -Colonoscopy as outpatient after his cirrhosis has stabilized  8. At risk for hepatic encephalopathy in setting or splenorenal shunt. Ammonia 48.  -Monitor neuro status closely - Ammonia level in am  -Lactulose 30gm po daily for  now   Active Problems:   Cirrhosis ([place])   Alcoholic hepatitis with ascites   Coagulopathy ([place])   Portal hypertension ([place])     LOS: 2 days   Noralyn Pick  01/03/2020, 8:54 AM

## 2020-01-04 LAB — COMPREHENSIVE METABOLIC PANEL
ALT: 71 U/L — ABNORMAL HIGH (ref 0–44)
AST: 123 U/L — ABNORMAL HIGH (ref 15–41)
Albumin: 1.8 g/dL — ABNORMAL LOW (ref 3.5–5.0)
Alkaline Phosphatase: 122 U/L (ref 38–126)
Anion gap: 8 (ref 5–15)
BUN: 21 mg/dL — ABNORMAL HIGH (ref 6–20)
CO2: 23 mmol/L (ref 22–32)
Calcium: 8.2 mg/dL — ABNORMAL LOW (ref 8.9–10.3)
Chloride: 105 mmol/L (ref 98–111)
Creatinine, Ser: 1.02 mg/dL (ref 0.61–1.24)
GFR calc Af Amer: >60 mL/min (ref >60)
GFR calc non Af Amer: >60 mL/min (ref >60)
Glucose, Bld: 118 mg/dL — ABNORMAL HIGH (ref 70–99)
Potassium: 3.7 mmol/L (ref 3.5–5.1)
Sodium: 136 mmol/L (ref 135–145)
Total Bilirubin: 26 mg/dL (ref 0.3–1.2)
Total Protein: 6.1 g/dL — ABNORMAL LOW (ref 6.5–8.1)

## 2020-01-04 LAB — AMMONIA: Ammonia: 107 umol/L — ABNORMAL HIGH (ref 9–35)

## 2020-01-04 LAB — CBC
HCT: 31.6 % — ABNORMAL LOW (ref 39.0–52.0)
Hemoglobin: 11 g/dL — ABNORMAL LOW (ref 13.0–17.0)
MCH: 38.7 pg — ABNORMAL HIGH (ref 26.0–34.0)
MCHC: 34.8 g/dL (ref 30.0–36.0)
MCV: 111.3 fL — ABNORMAL HIGH (ref 80.0–100.0)
Platelets: 45 10*3/uL — ABNORMAL LOW (ref 150–400)
RBC: 2.84 MIL/uL — ABNORMAL LOW (ref 4.22–5.81)
RDW: 18.1 % — ABNORMAL HIGH (ref 11.5–15.5)
WBC: 5.2 10*3/uL (ref 4.0–10.5)
nRBC: 0 % (ref 0.0–0.2)

## 2020-01-04 LAB — PROTIME-INR
INR: 3 — ABNORMAL HIGH (ref 0.8–1.2)
Prothrombin Time: 30.1 seconds — ABNORMAL HIGH (ref 11.4–15.2)

## 2020-01-04 MED ORDER — SALINE SPRAY 0.65 % NA SOLN
1.0000 | NASAL | Status: DC | PRN
  Administered 2020-01-04: 1 via NASAL
  Filled 2020-01-04: qty 44

## 2020-01-04 MED ORDER — LACTULOSE 10 GM/15ML PO SOLN
10.0000 g | Freq: Two times a day (BID) | ORAL | Status: DC
  Administered 2020-01-04 – 2020-01-05 (×2): 10 g via ORAL
  Filled 2020-01-04 (×2): qty 15

## 2020-01-04 MED ORDER — LACTULOSE 10 GM/15ML PO SOLN
30.0000 g | Freq: Two times a day (BID) | ORAL | Status: DC
  Administered 2020-01-04: 30 g via ORAL
  Filled 2020-01-04: qty 45

## 2020-01-04 MED ORDER — ALUM & MAG HYDROXIDE-SIMETH 200-200-20 MG/5ML PO SUSP
30.0000 mL | ORAL | Status: DC | PRN
  Administered 2020-01-06 – 2020-01-09 (×5): 30 mL via ORAL
  Filled 2020-01-04 (×6): qty 30

## 2020-01-04 MED ORDER — VITAMIN K1 10 MG/ML IJ SOLN
10.0000 mg | Freq: Once | INTRAMUSCULAR | Status: AC
  Administered 2020-01-04: 10 mg via SUBCUTANEOUS
  Filled 2020-01-04: qty 1

## 2020-01-04 NOTE — Progress Notes (Signed)
[facility] Progress Note  CC:  alcoholic hepatitis, EtOH cirrhosis  Subjective: He had low level upper abdominal pain that radiated to his central abdomen at 3:30am. No N/V. He has lower abdominal discomfort if he turns in bed and when getting out of bed to stand. He passed 3 brown loose stools with bits of solid stool yesterday, few areas of "black" color to his stool. No frank melena. No rectal bleeding. No confusion last night or this morning.   Objective:  Paracentesis 01/02/2020: 1.4 L of clear golden yellow fluid removed.  No evidence of SBP. SAAG < 1.1  Ascitic protein > 2.5. Cytology showed reactive mesothelial cells. Urine sodium < 10. Normal BUN/Cr. Urine protein negative.   ECHO 01/02/2020: 1. Left ventricular ejection fraction, by estimation, is 70 to 75%. The left ventricle has hyperdynamic function. The left ventricle has no regional wall motion abnormalities. There is moderate left ventricular hypertrophy. Left ventricular diastolic parameters are consistent with Grade I diastolic dysfunction (impaired relaxation). 2. Right ventricular systolic function is normal. The right ventricular size is normal.  Vital signs in last 24 hours: Temp:  [97.8 F (36.6 C)-98.2 F (36.8 C)] 98.2 F (36.8 C) (05/07 0645) Pulse Rate:  [83-97] 97 (05/07 0645) Resp:  [17-20] 17 (05/07 0645) BP: (106-124)/(58-76) 106/58 (05/07 0645) SpO2:  [82 %-95 %] 82 % (05/07 0645) Last BM Date: 01/03/20   General:   Alert 54 year old male in NAD.  Eyes:  Moderate scleral icterus. Conjunctiva pink.  Heart: RRR, no murmur.  Pulm:  Breath sounds clear throughout.  Abdomen: Protuberant, small amount of ascites, abdomen is not tense. Nontender. No hepatomegaly. + Splenomegaly. + BS x 4 quads. Extremities:  Bilateral lower extremity 1+ edema. Neurologic:  Alert and  oriented x4;  grossly normal neurologically. Psych:  Alert and cooperative. Normal mood and affect.  Intake/Output  from previous day: 05/06 0701 - 05/07 0700 In: 480 [P.O.:480] Out: -  Intake/Output this shift: No intake/output data recorded.  Lab Results: Recent Labs    01/02/20 0524 01/03/20 0519 01/04/20 0457  WBC 7.1 6.4 5.2  HGB 12.0* 11.4* 11.0*  HCT 34.1* 33.3* 31.6*  PLT 54* 47* 45*   BMET Recent Labs    01/02/20 0524 01/03/20 0519 01/04/20 0457  NA 133* 136 136  K 3.2* 4.1 3.7  CL 100 102 105  CO2 '25 25 23  '$ GLUCOSE 83 111* 118*  BUN 15 17 21*  CREATININE 0.80 0.76 1.02  CALCIUM 7.9* 8.5* 8.2*   LFT Recent Labs    01/04/20 0457  PROT 6.1*  ALBUMIN 1.8*  AST 123*  ALT 71*  ALKPHOS 122  BILITOT 26.0*   PT/INR Recent Labs    01/03/20 0519 01/04/20 0457  LABPROT 31.5* 30.1*  INR 3.2* 3.0*   Hepatitis Panel Recent Labs    01/02/20 0524  HEPBSAG NON REACTIVE  HCVAB NON REACTIVE  HEPBIGM NON REACTIVE    US Paracentesis  Result Date: 01/02/2020 INDICATION: Patient with history of acute alcoholic hepatitis/chronic alcoholic cirrhosis, ascites; request received for diagnostic and therapeutic paracentesis. EXAM: ULTRASOUND GUIDED DIAGNOSTIC AND THERAPEUTIC PARACENTESIS MEDICATIONS: None COMPLICATIONS: None immediate. PROCEDURE: Informed written consent was obtained from the patient after a discussion of the risks, benefits and alternatives to treatment. A timeout was performed prior to the initiation of the procedure. Initial ultrasound scanning demonstrates a small amount of ascites within the right lower abdominal quadrant. The right lower abdomen was prepped and draped in the usual sterile  fashion. 1% lidocaine was used for local anesthesia. Following this, a 19 gauge, 10-cm, Yueh catheter was introduced. An ultrasound image was saved for documentation purposes. The paracentesis was performed. The catheter was removed and a dressing was applied. The patient tolerated the procedure well without immediate post procedural complication. FINDINGS: A total of approximately  1.4 liters of clear, golden yellow fluid was removed. Samples were sent to the laboratory as requested by the clinical team. IMPRESSION: Successful ultrasound-guided diagnostic and therapeutic paracentesis yielding 1.4 liters of peritoneal fluid. Read by: Rowe Robert, PA-C Electronically Signed   By: Corrie Mckusick D.O.   On: 01/02/2020 12:44   ECHOCARDIOGRAM COMPLETE  Result Date: 01/02/2020    ECHOCARDIOGRAM REPORT   Patient Name:   Jesse Esparza Date of Exam: 01/02/2020 Medical Rec #:  [id number]       Height:       65.0 in Accession #:    [id number]      Weight:       254.1 lb Date of Birth:  1966/07/29      BSA:          2.190 m Patient Age:    69 years        BP:           111/67 mmHg Patient Gender: M               HR:           93 bpm. Exam Location:  Inpatient Procedure: 2D Echo, Cardiac Doppler and Color Doppler Indications:    R94.31 Abnormal EKG  History:        Patient has no prior history of Echocardiogram examinations.                 Risk Factors:Hypertension. ETOH. Edema. Ascites.  Sonographer:    Roseanna Rainbow RDCS Referring Phys: [id number] [place] Comments: Suboptimal parasternal window, no subcostal window, Technically difficult study due to poor echo windows and patient is morbidly obese. Image acquisition challenging due to patient body habitus. Study is to rule out right heart failure. IMPRESSIONS  1. Left ventricular ejection fraction, by estimation, is 70 to 75%. The left ventricle has hyperdynamic function. The left ventricle has no regional wall motion abnormalities. There is moderate left ventricular hypertrophy. Left ventricular diastolic parameters are consistent with Grade I diastolic dysfunction (impaired relaxation).  2. Right ventricular systolic function is normal. The right ventricular size is normal.  3. The mitral valve is normal in structure. No evidence of mitral valve regurgitation. No evidence of mitral stenosis.  4. The aortic valve was not well  visualized. Aortic valve regurgitation is not visualized. No aortic stenosis is present.  5. The inferior vena cava is normal in size with greater than 50% respiratory variability, suggesting right atrial pressure of 3 mmHg. FINDINGS  Left Ventricle: Left ventricular ejection fraction, by estimation, is 70 to 75%. The left ventricle has hyperdynamic function. The left ventricle has no regional wall motion abnormalities. The left ventricular internal cavity size was normal in size. There is moderate left ventricular hypertrophy. Left ventricular diastolic parameters are consistent with Grade I diastolic dysfunction (impaired relaxation). Right Ventricle: The right ventricular size is normal. No increase in right ventricular wall thickness. Right ventricular systolic function is normal. Left Atrium: Left atrial size was normal in size. Right Atrium: Right atrial size was normal in size. Pericardium: There is no evidence of pericardial effusion. Mitral Valve: The mitral valve is normal in  structure. Normal mobility of the mitral valve leaflets. No evidence of mitral valve regurgitation. No evidence of mitral valve stenosis. Tricuspid Valve: The tricuspid valve is normal in structure. Tricuspid valve regurgitation is not demonstrated. No evidence of tricuspid stenosis. Aortic Valve: The aortic valve was not well visualized. Aortic valve regurgitation is not visualized. No aortic stenosis is present. Pulmonic Valve: The pulmonic valve was normal in structure. Pulmonic valve regurgitation is not visualized. No evidence of pulmonic stenosis. Aorta: The aortic root is normal in size and structure. Venous: The inferior vena cava is normal in size with greater than 50% respiratory variability, suggesting right atrial pressure of 3 mmHg. IAS/Shunts: No atrial level shunt detected by color flow Doppler.  LEFT VENTRICLE PLAX 2D LVIDd:         4.08 cm     Diastology LVIDs:         2.54 cm     LV e' lateral:   9.79 cm/s LV PW:          1.46 cm     LV E/e' lateral: 9.0 LV IVS:        1.33 cm     LV e' medial:    7.40 cm/s LVOT diam:     2.10 cm     LV E/e' medial:  11.9 LV SV:         80 LV SV Index:   37 LVOT Area:     3.46 cm  LV Volumes (MOD) LV vol d, MOD A2C: 48.6 ml LV vol d, MOD A4C: 68.5 ml LV vol s, MOD A2C: 11.8 ml LV vol s, MOD A4C: 17.0 ml LV SV MOD A2C:     36.8 ml LV SV MOD A4C:     68.5 ml LV SV MOD BP:      45.2 ml RIGHT VENTRICLE RV S prime:     13.40 cm/s TAPSE (M-mode): 2.0 cm LEFT ATRIUM             Index       RIGHT ATRIUM           Index LA diam:        3.70 cm 1.69 cm/m  RA Area:     12.50 cm LA Vol (A2C):   31.2 ml 14.25 ml/m RA Volume:   20.40 ml  9.32 ml/m LA Vol (A4C):   46.5 ml 21.23 ml/m LA Biplane Vol: 40.8 ml 18.63 ml/m  AORTIC VALVE LVOT Vmax:   134.00 cm/s LVOT Vmean:  96.000 cm/s LVOT VTI:    0.231 m  AORTA Ao Root diam: 3.30 cm Ao Asc diam:  3.20 cm MITRAL VALVE MV Area (PHT): 3.31 cm     SHUNTS MV Decel Time: 229 msec     Systemic VTI:  0.23 m MV E velocity: 87.80 cm/s   Systemic Diam: 2.10 cm MV A velocity: 123.00 cm/s MV E/A ratio:  0.71 Candee Furbish MD Electronically signed by Candee Furbish MD Signature Date/Time: 01/02/2020/11:01:13 AM    Final     Assessment / Plan:  57. 54 year old male with acute alcoholichepatitis, chronicalcoholic cirrhosis with ascites. MELD 32. CTAP 01/01/2020 consistent with cirrhosis with moderate ascites, splenomegaly with a spontaneous splenorenal shunt and cholelithiasis. Discriminant function 101.9-> 115. Prednisolone 40 mg p.o. daily started on 5/5. S/P paracentesis 5/5 with 1.4 L of clear golden yellow fluid removed.  No evidence of SBP.  Cytology negtive for malignancy. SAAG < 1.1  Ascitic protein > 2.5.  Echo 5/5 normal RV  function, LVEF 70 to 75% and grade 1 diastolic dysfunction.  Urine sodium < 10. Normal BUN/Cr.  Low ceruloplasmin level 13.8 (most likely due to alcoholic liver disease and unlikely Wilson's dz).  A1AT 129.  Acute hepatitis panel negative.  GGT 117. IgG grossly elevated 2,269. SMA mildly elevated at 21. ANA ordered but lab error occurred. AMA < 20.  Iron 104.  Today alk phos 122. AST 123.  ALT 71.  Total bili 26 down from 29.3.  He complains of having intermittent epigastric and central abdominal burning discomfort. PPI started 5/6. He is not a liver transplant candidate due to alcohol abuse. He intends to enter inpatient rehab after hospital discharge.  -Continue Prednisolone 40 mg daily, check Lille score on day 7 -Continue Spironolactone 50 mg 1 p.o. daily (started on 5/6) -Na+, K+, BUN/Cr normal, add Furosemide '20mg'$  QD -Monitor BMP and hepatic panel daily -Add ANA to prior lab draw this am -Ondansetron '4mg'$  po or IV Q 6hrs PRN -Pantoprazole '40mg'$  po QD -2 g low-sodium diet -Eventual EGD due to heme + stools, epigastric discomfort and to assess for esophageal/gastric varices, timing to be determined by Dr. Tarri Glenn. INR 3.0  today.   2. Hyponatremia secondary to #1, resolvedNa+ 130 -> 133 -> 136  3. Hypokalemia, resolved  4. Macrocytic anemia. Hemoglobin 12.3-> 12 ->11.4. -> 11.0 (Base line Hg 13.3 on 10/10/2017).Hematocrit 36.1-> 34.1 -> 31.6.MCV 111.3.[id number]. Ferritin 1692. Folate 6.9. Vitamin B12 2818.  5. Coagulopathy secondary to cirrhosis. INR 3.0 - 3.3 -> 3.2 -> today 3.0. Vitamin K '10mg'$  po x 1 daily x 3 days (last dose was on 5/6).  -? Additional vitamin K -Repeat INR in am.  6. 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 PLT 51-> 54 -> 47.  7.Patient reports 4 to 5-year history of black solid stool and intermittent bright red rectal bleeding x 3 occasions over the past 2 months. Exam5/4showed brown stool grossly heme positive. -EGD to assess for esophageal and gastric varicesafter INR corrected -Colonoscopy as outpatient after his cirrhosis has stabilized  8. At risk for hepatic encephalopathy in setting or splenorenal shunt. Ammonia 48 -> 107. No signs of confusion today.   -Monitor neuro status closely -Continue Lactulose 10gm po bid titrate not to exceed 3 to 4 loose BMs daily  Further recommendations per Dr. Tarri Glenn   Active Problems:   Cirrhosis ([place])   Alcoholic hepatitis with ascites   Coagulopathy ([place])   Portal hypertension ([place])     LOS: 3 days   Noralyn Pick  01/04/2020, 8:31am

## 2020-01-04 NOTE — Progress Notes (Signed)
[facility]                                                                              Patient Demographics  Jesse Esparza, is a 54 y.o. male, DOB - 02-Jul-1966, [id number]  Admit date - 01/01/2020   Admitting Physician Emeterio Reeve, DO  Outpatient Primary MD for the patient is Charlott Rakes, MD  Outpatient specialists:   LOS - 3  days   Medical records reviewed and are as summarized below:    Chief Complaint  Patient presents with  . abdominal distention  . Leg Swelling  . Jaundice       Brief summary   Patient is a 54 year old male with history of known cirrhosis, GERD, hyperlipidemia, hypertension, obesity presented with 3 weeks of worsening fatigue, nausea, decreased appetite.  Also reported lower extremity edema, worsening abdominal distention, yellowing of the skin and eyes.  Patient reports he has been consuming 4 to 6 cans of beers per day for years and stopped 2 weeks ago.  Former use of liquor Showed cirrhotic liver with splenomegaly and spontaneous splenorenal shunt, moderate ascites with cholelithiasis. Labs with transaminitis, elevated bili, hypoalbuminemia, hypercalcemia, elevated INR, elevated ammonia, macrocytic anemia   Assessment & Plan    Principal problem   Cirrhosis ([place]) with associated transaminitis, elevated bilirubin, thrombocytopenia, alcoholic hepatitis -Alcohol-related advanced liver disease with heavy daily alcohol use until 2 weeks ago, findings consistent with alcoholic hepatitis -Had an EGD in 2017 which showed no portal hypertension -Status post paracentesis on 5/5, not consistent with SBP -Discriminant function 101, follow closely for hepatic encephalopathy, at risk -Started on prednisolone 40 mg daily, continue spironolactone.  Hold off on Lasix due to borderline BP, monitor electrolytes -Total bili trending down -Ammonia level 107, currently alert and oriented, continue lactulose, patient refused MiraLAX  today    Alcoholic hepatitis with ascites -Status post paracentesis on 5/5, 1.4 L removed. 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 daily for 3 days  Intermittent GI bleed -Patient reported history of black solid stool and intermittent bright red blood over the past 2 months -FOBT positive.  Per GI EGD to assess for esophageal and gastric varices after INR is corrected -Colonoscopy outpatient  Obesity Estimated body mass index is 42.28 kg/m as calculated from the following:   Height as of this encounter: 5\' 5"  (1.651 m).   Weight as of this encounter: 115.3 kg.  Code Status: Full code DVT Prophylaxis: SCDs Family Communication: Discussed all imaging results, lab results, explained to the patient and family member at bedside in detail   Disposition  Plan:     Status is: Inpatient  Remains inpatient appropriate because:Inpatient level of care appropriate due to severity of illness   Dispo: The patient is from: Home              Anticipated d/c is to: Home              Anticipated d/c date is: > 3 days              Patient currently is not medically stable to d/c.       Time Spent in minutes   59mins    Procedures:  Paracentesis  Consultants:   Interventional radiology Gastroenterology  Antimicrobials:   Anti-infectives (From admission, onward)   None          Medications  Scheduled Meds: . folic acid  1 mg Oral Daily  . lactulose  10 g Oral BID  . multivitamin with minerals  1 tablet Oral Daily  . pantoprazole  40 mg Oral Daily  . polyethylene glycol  17 g Oral Daily  . prednisoLONE  40 mg Oral Daily  . spironolactone  50 mg Oral Daily  . thiamine  100 mg Oral Daily   Continuous Infusions: PRN Meds:.      Subjective:   Jesse Esparza was seen and examined today.  Having epistaxis today morning, mild,.  No significant nausea vomiting or abdominal pain.  Patient denies dizziness, chest pain, shortness of breath, new weakness, numbess, tingling. No acute events overnight.  No fevers, alert and oriented  Objective:   Vitals:   01/04/20 0645 01/04/20 1025 01/04/20 1027 01/04/20 1028  BP: (!) 106/58 112/62 117/73 115/73  Pulse: 97 93 95 (!) 105  Resp: 17 20 20 20   Temp: 98.2 F (36.8 C) 97.6 F (36.4 C)    TempSrc: Oral Oral    SpO2: (!) 82% 94% 96% 96%  Weight:      Height:        Intake/Output Summary (Last 24 hours) at 01/04/2020 1418 Last data filed at 01/04/2020 0900 Gross per 24 hour  Intake 240 ml  Output --  Net 240 ml     Wt Readings from Last 3 Encounters:  01/01/20 115.3 kg  11/07/17 99.3 kg  10/10/17 98 kg   Physical Exam  General: Alert and oriented x 3, NAD, scleral icterus  Cardiovascular: S1 S2 clear, RRR.  Trace pedal edema b/l  Respiratory: CTAB, no wheezing, rales or rhonchi  Gastrointestinal: Soft, nontender, nondistended, NBS  Ext: Trace pedal edema bilaterally  Neuro: no new deficits  Musculoskeletal: No cyanosis, clubbing  Skin: No rashes  Psych: Normal affect and demeanor, alert and oriented x3    Data Reviewed:  I have personally reviewed following labs and imaging studies  Micro Results Recent Results (from the past 240 hour(s))  Respiratory Panel by RT PCR (Flu A&B, Covid) - Nasopharyngeal Swab     Status: None   Collection Time: 01/01/20  2:07 PM   Specimen:  Nasopharyngeal Swab  Result Value Ref Range Status   SARS Coronavirus 2 by RT PCR NEGATIVE NEGATIVE Final    Comment: (NOTE) SARS-CoV-2 target nucleic acids are NOT DETECTED. The SARS-CoV-2 RNA is generally detectable in upper respiratoy specimens during the acute phase of infection. The lowest concentration of SARS-CoV-2 viral copies this assay can detect is 131 copies/mL. A negative result does not preclude SARS-Cov-2 infection and should not be used as the sole basis for treatment or other patient management decisions. A  negative result may occur with  improper specimen collection/handling, submission of specimen other than nasopharyngeal swab, presence of viral mutation(s) within the areas targeted by this assay, and inadequate number of viral copies (<131 copies/mL). A negative result must be combined with clinical observations, patient history, and epidemiological information. The expected result is Negative. Fact Sheet for Patients:  PinkCheek.be Fact Sheet for Healthcare Providers:  GravelBags.it This test is not yet ap proved or cleared by the [place] FDA and  has been authorized for detection and/or diagnosis of SARS-CoV-2 by FDA under an Emergency Use Authorization (EUA). This EUA will remain  in effect (meaning this test can be used) for the duration of the COVID-19 declaration under Section 564(b)(1) of the Act, 21 U.S.C. section 360bbb-3(b)(1), unless the authorization is terminated or revoked sooner.    Influenza A by PCR NEGATIVE NEGATIVE Final   Influenza B by PCR NEGATIVE NEGATIVE Final    Comment: (NOTE) The Xpert Xpress SARS-CoV-2/FLU/RSV assay is intended as an aid in  the diagnosis of influenza from Nasopharyngeal swab specimens and  should not be used as a sole basis for treatment. Nasal washings and  aspirates are unacceptable for Xpert Xpress SARS-CoV-2/FLU/RSV  testing. Fact Sheet for  Patients: PinkCheek.be Fact Sheet for Healthcare Providers: GravelBags.it This test is not yet approved or cleared by the [place] FDA and  has been authorized for detection and/or diagnosis of SARS-CoV-2 by  FDA under an Emergency Use Authorization (EUA). This EUA will remain  in effect (meaning this test can be used) for the duration of the  Covid-19 declaration under Section 564(b)(1) of the Act, 21  U.S.C. section 360bbb-3(b)(1), unless the authorization is  terminated or revoked. Performed at [facility], [place] [address], [place], [place] [address]   Culture, body fluid-bottle     Status: None (Preliminary result)   Collection Time: 01/02/20 12:36 PM   Specimen: Fluid  Result Value Ref Range Status   Specimen Description FLUID PERITONEAL  Final   Special Requests BOTTLES DRAWN AEROBIC AND ANAEROBIC 10CC  Final   Culture   Final    NO GROWTH 2 DAYS Performed at [facility], [place] [address], [place], [place] [address]    Report Status PENDING  Incomplete  Gram stain     Status: None   Collection Time: 01/02/20 12:36 PM   Specimen: Fluid  Result Value Ref Range Status   Specimen Description FLUID PERITONEAL  Final   Special Requests NONE  Final   Gram Stain   Final    FEW WBC PRESENT, PREDOMINANTLY MONONUCLEAR NO ORGANISMS SEEN Performed at [facility], [address] [place], [place] [address]    Report Status 01/02/2020 FINAL  Final    Radiology Reports CT ABDOMEN PELVIS W CONTRAST  Result Date: 01/01/2020 CLINICAL DATA:  Cirrhosis, weight gain, abdominal distension, leg swelling, jaundice, history GERD, hypertension EXAM: CT ABDOMEN AND PELVIS WITH CONTRAST TECHNIQUE: Multidetector CT imaging of the abdomen and pelvis was performed using the standard protocol following bolus administration of intravenous contrast. Sagittal and coronal MPR images reconstructed from axial data  set. CONTRAST:  12mL OMNIPAQUE IOHEXOL 300 MG/ML SOLN IV. No oral contrast. COMPARISON:  04/05/2016 FINDINGS: Lower chest: Subsegmental atelectasis RIGHT lower lobe. Tiny RIGHT pleural effusion. Hepatobiliary: Dependent calculi within gallbladder. Gallbladder wall appears thickened, nonspecific in the setting of ascites. Heterogeneous cirrhotic appearing liver without discrete mass. No intrahepatic or extrahepatic biliary dilatation. Pancreas: Normal appearance Spleen: Mildly enlarged, 14.6 x  13.6 x 5.3 cm (volume = 550 cm^3). No focal mass. Adrenals/Urinary Tract: Adrenal glands, kidneys, ureters, and bladder normal appearance Stomach/Bowel: Normal appendix. Stomach and bowel loops normal appearance Vascular/Lymphatic: Aorta normal caliber with mild scattered atherosclerotic calcifications. Vascular structures patent. Large perisplenic collateral extending to LEFT renal vein consistent with spontaneous splenorenal shunt. Major vascular structures patent. No adenopathy. Reproductive: Unremarkable seminal vesicles. Prostate gland minimally enlarged at 4.8 x 3.5 cm image 96. Other: Moderate ascites. No free air. No hernia. Scattered edema/infiltration of tissue planes in abdomen. 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 Moderate ascites. Cholelithiasis. Minimal prostatic enlargement. Electronically Signed   By: Lavonia Dana M.D.   On: 01/01/2020 12:54   US Paracentesis  Result Date: 01/02/2020 INDICATION: Patient with history of acute alcoholic hepatitis/chronic alcoholic cirrhosis, ascites; request received for diagnostic and therapeutic paracentesis. EXAM: ULTRASOUND GUIDED DIAGNOSTIC AND THERAPEUTIC PARACENTESIS MEDICATIONS: None COMPLICATIONS: None immediate. PROCEDURE: Informed written consent was obtained from the patient after a discussion of the risks, benefits and alternatives to treatment. A timeout was performed prior to the initiation  of the procedure. Initial ultrasound scanning demonstrates a small amount of ascites within the right lower abdominal quadrant. The right lower abdomen was prepped and draped in the usual sterile fashion. 1% lidocaine was used for local anesthesia. Following this, a 19 gauge, 10-cm, Yueh catheter was introduced. An ultrasound image was saved for documentation purposes. The paracentesis was performed. The catheter was removed and a dressing was applied. The patient tolerated the procedure well without immediate post procedural complication. FINDINGS: A total of approximately 1.4 liters of clear, golden yellow fluid was removed. Samples were sent to the laboratory as requested by the clinical team. IMPRESSION: Successful ultrasound-guided diagnostic and therapeutic paracentesis yielding 1.4 liters of peritoneal fluid. Read by: Rowe Robert, PA-C Electronically Signed   By: Corrie Mckusick D.O.   On: 01/02/2020 12:44   ECHOCARDIOGRAM COMPLETE  Result Date: 01/02/2020    ECHOCARDIOGRAM REPORT   Patient Name:   Jesse Esparza Date of Exam: 01/02/2020 Medical Rec #:  [id number]       Height:       65.0 in Accession #:    [id number]      Weight:       254.1 lb Date of Birth:  22-Jun-1966      BSA:          2.190 m Patient Age:    37 years        BP:           111/67 mmHg Patient Gender: M               HR:           93 bpm. Exam Location:  Inpatient Procedure: 2D Echo, Cardiac Doppler and Color Doppler Indications:    R94.31 Abnormal EKG  History:        Patient has no prior history of Echocardiogram examinations.                 Risk Factors:Hypertension. ETOH. Edema. Ascites.  Sonographer:    Roseanna Rainbow RDCS Referring Phys: [id number] [place] Comments: Suboptimal parasternal window, no subcostal window, Technically difficult study due to poor echo windows and patient is morbidly obese. Image acquisition challenging due to patient body habitus. Study is to rule out right heart failure. IMPRESSIONS   1. Left ventricular ejection fraction, by estimation, is 70 to 75%. The left ventricle has hyperdynamic function.  The left ventricle has no regional wall motion abnormalities. There is moderate left ventricular hypertrophy. Left ventricular diastolic parameters are consistent with Grade I diastolic dysfunction (impaired relaxation).  2. Right ventricular systolic function is normal. The right ventricular size is normal.  3. The mitral valve is normal in structure. No evidence of mitral valve regurgitation. No evidence of mitral stenosis.  4. The aortic valve was not well visualized. Aortic valve regurgitation is not visualized. No aortic stenosis is present.  5. The inferior vena cava is normal in size with greater than 50% respiratory variability, suggesting right atrial pressure of 3 mmHg. FINDINGS  Left Ventricle: Left ventricular ejection fraction, by estimation, is 70 to 75%. The left ventricle has hyperdynamic function. The left ventricle has no regional wall motion abnormalities. The left ventricular internal cavity size was normal in size. There is moderate left ventricular hypertrophy. Left ventricular diastolic parameters are consistent with Grade I diastolic dysfunction (impaired relaxation). Right Ventricle: The right ventricular size is normal. No increase in right ventricular wall thickness. Right ventricular systolic function is normal. Left Atrium: Left atrial size was normal in size. Right Atrium: Right atrial size was normal in size. Pericardium: There is no evidence of pericardial effusion. Mitral Valve: The mitral valve is normal in structure. Normal mobility of the mitral valve leaflets. No evidence of mitral valve regurgitation. No evidence of mitral valve stenosis. Tricuspid Valve: The tricuspid valve is normal in structure. Tricuspid valve regurgitation is not demonstrated. No evidence of tricuspid stenosis. Aortic Valve: The aortic valve was not well visualized. Aortic valve regurgitation is  not visualized. No aortic stenosis is present. Pulmonic Valve: The pulmonic valve was normal in structure. Pulmonic valve regurgitation is not visualized. No evidence of pulmonic stenosis. Aorta: The aortic root is normal in size and structure. Venous: The inferior vena cava is normal in size with greater than 50% respiratory variability, suggesting right atrial pressure of 3 mmHg. IAS/Shunts: No atrial level shunt detected by color flow Doppler.  LEFT VENTRICLE PLAX 2D LVIDd:         4.08 cm     Diastology LVIDs:         2.54 cm     LV e' lateral:   9.79 cm/s LV PW:         1.46 cm     LV E/e' lateral: 9.0 LV IVS:        1.33 cm     LV e' medial:    7.40 cm/s LVOT diam:     2.10 cm     LV E/e' medial:  11.9 LV SV:         80 LV SV Index:   37 LVOT Area:     3.46 cm  LV Volumes (MOD) LV vol d, MOD A2C: 48.6 ml LV vol d, MOD A4C: 68.5 ml LV vol s, MOD A2C: 11.8 ml LV vol s, MOD A4C: 17.0 ml LV SV MOD A2C:     36.8 ml LV SV MOD A4C:     68.5 ml LV SV MOD BP:      45.2 ml RIGHT VENTRICLE RV S prime:     13.40 cm/s TAPSE (M-mode): 2.0 cm LEFT ATRIUM             Index       RIGHT ATRIUM           Index LA diam:        3.70 cm 1.69 cm/m  RA Area:  12.50 cm LA Vol (A2C):   31.2 ml 14.25 ml/m RA Volume:   20.40 ml  9.32 ml/m LA Vol (A4C):   46.5 ml 21.23 ml/m LA Biplane Vol: 40.8 ml 18.63 ml/m  AORTIC VALVE LVOT Vmax:   134.00 cm/s LVOT Vmean:  96.000 cm/s LVOT VTI:    0.231 m  AORTA Ao Root diam: 3.30 cm Ao Asc diam:  3.20 cm MITRAL VALVE MV Area (PHT): 3.31 cm     SHUNTS MV Decel Time: 229 msec     Systemic VTI:  0.23 m MV E velocity: 87.80 cm/s   Systemic Diam: 2.10 cm MV A velocity: 123.00 cm/s MV E/A ratio:  0.71 Candee Furbish MD Electronically signed by Candee Furbish MD Signature Date/Time: 01/02/2020/11:01:13 AM    Final     Lab Data:  CBC: Recent Labs  Lab 01/01/20 1018 01/02/20 0524 01/03/20 0519 01/04/20 0457  WBC 6.8 7.1 6.4 5.2  NEUTROABS 5.4 5.5  --   --   HGB 12.3* 12.0* 11.4* 11.0*  HCT  36.1* 34.1*  NOT PERFORMED 33.3* 31.6*  MCV 108.1* 107.6* 108.1* 111.3*  PLT 51* 54* 47* 45*   Basic Metabolic Panel: Recent Labs  Lab 01/01/20 1018 01/02/20 0524 01/03/20 0519 01/04/20 0457  NA 130* 133* 136 136  K 3.3* 3.2* 4.1 3.7  CL 99 100 102 105  CO2 25 25 25 23   GLUCOSE 89 83 111* 118*  BUN 13 15 17  21*  CREATININE 0.63 0.80 0.76 1.02  CALCIUM 7.8* 7.9* 8.5* 8.2*   GFR: Estimated Creatinine Clearance: 96.1 mL/min (by C-G formula based on SCr of 1.02 mg/dL). Liver Function Tests: Recent Labs  Lab 01/01/20 1018 01/02/20 0524 01/03/20 0519 01/04/20 0457  AST 181* 168* 155* 123*  ALT 69* 71* 73* 71*  ALKPHOS 151* 137* 127* 122  BILITOT 26.0* 26.2* 29.3* 26.0*  PROT 6.9 6.7 6.6 6.1*  ALBUMIN 1.8* 1.7* 2.0* 1.8*   No results for input(s): LIPASE, AMYLASE in the last 168 hours. Recent Labs  Lab 01/01/20 1018 01/04/20 0458  AMMONIA 48* 107*   Coagulation Profile: Recent Labs  Lab 01/01/20 1018 01/02/20 0524 01/03/20 0519 01/04/20 0457  INR 3.0* 3.3* 3.2* 3.0*   Cardiac Enzymes: No results for input(s): CKTOTAL, CKMB, CKMBINDEX, TROPONINI in the last 168 hours. BNP (last 3 results) No results for input(s): PROBNP in the last 8760 hours. HbA1C: No results for input(s): HGBA1C in the last 72 hours. CBG: No results for input(s): GLUCAP in the last 168 hours. Lipid Profile: No results for input(s): CHOL, HDL, LDLCALC, TRIG, CHOLHDL, LDLDIRECT in the last 72 hours. Thyroid Function Tests: No results for input(s): TSH, T4TOTAL, FREET4, T3FREE, THYROIDAB in the last 72 hours. Anemia Panel: Recent Labs    01/02/20 0524  VITAMINB12 2,818*  FOLATE 6.9  FERRITIN 1,692*  TIBC 87*  IRON 104   Urine analysis:    Component Value Date/Time   COLORURINE AMBER (A) 01/01/2020 1031   APPEARANCEUR HAZY (A) 01/01/2020 1031   LABSPEC 1.013 01/01/2020 1031   PHURINE 6.0 01/01/2020 1031   GLUCOSEU 50 (A) 01/01/2020 1031   HGBUR SMALL (A) 01/01/2020 1031    BILIRUBINUR MODERATE (A) 01/01/2020 1031   BILIRUBINUR large (A) 03/19/2016 1103   BILIRUBINUR small 12/25/2014 1001   KETONESUR 5 (A) 01/01/2020 1031   PROTEINUR NEGATIVE 01/01/2020 1031   UROBILINOGEN 4.0 03/19/2016 1103   UROBILINOGEN 0.2 01/21/2015 0623   NITRITE NEGATIVE 01/01/2020 1031   LEUKOCYTESUR NEGATIVE 01/01/2020 1031  Estill Cotta M.D. [facility] 01/04/2020, 2:18 PM   Call night coverage person covering after 7pm

## 2020-01-05 DIAGNOSIS — K7030 Alcoholic cirrhosis of liver without ascites: Secondary | ICD-10-CM

## 2020-01-05 LAB — COMPREHENSIVE METABOLIC PANEL
ALT: 79 U/L — ABNORMAL HIGH (ref 0–44)
AST: 118 U/L — ABNORMAL HIGH (ref 15–41)
Albumin: 1.8 g/dL — ABNORMAL LOW (ref 3.5–5.0)
Alkaline Phosphatase: 140 U/L — ABNORMAL HIGH (ref 38–126)
Anion gap: 9 (ref 5–15)
BUN: 26 mg/dL — ABNORMAL HIGH (ref 6–20)
CO2: 24 mmol/L (ref 22–32)
Calcium: 7.9 mg/dL — ABNORMAL LOW (ref 8.9–10.3)
Chloride: 100 mmol/L (ref 98–111)
Creatinine, Ser: 1.24 mg/dL (ref 0.61–1.24)
GFR calc Af Amer: >60 mL/min (ref >60)
GFR calc non Af Amer: >60 mL/min (ref >60)
Glucose, Bld: 146 mg/dL — ABNORMAL HIGH (ref 70–99)
Potassium: 3.9 mmol/L (ref 3.5–5.1)
Sodium: 133 mmol/L — ABNORMAL LOW (ref 135–145)
Total Bilirubin: 28.9 mg/dL (ref 0.3–1.2)
Total Protein: 6.4 g/dL — ABNORMAL LOW (ref 6.5–8.1)

## 2020-01-05 LAB — CBC
HCT: 33.6 % — ABNORMAL LOW (ref 39.0–52.0)
Hemoglobin: 11.5 g/dL — ABNORMAL LOW (ref 13.0–17.0)
MCH: 37.2 pg — ABNORMAL HIGH (ref 26.0–34.0)
MCHC: 34.2 g/dL (ref 30.0–36.0)
MCV: 108.7 fL — ABNORMAL HIGH (ref 80.0–100.0)
Platelets: 48 10*3/uL — ABNORMAL LOW (ref 150–400)
RBC: 3.09 MIL/uL — ABNORMAL LOW (ref 4.22–5.81)
RDW: 17.8 % — ABNORMAL HIGH (ref 11.5–15.5)
WBC: 7.9 10*3/uL (ref 4.0–10.5)
nRBC: 0 % (ref 0.0–0.2)

## 2020-01-05 LAB — PROTIME-INR
INR: 2.7 — ABNORMAL HIGH (ref 0.8–1.2)
Prothrombin Time: 28.1 seconds — ABNORMAL HIGH (ref 11.4–15.2)

## 2020-01-05 LAB — AMMONIA: Ammonia: 122 umol/L — ABNORMAL HIGH (ref 9–35)

## 2020-01-05 NOTE — Progress Notes (Addendum)
[facility] Progress Note  CC:  alcoholic hepatitis, EtOH cirrhosis  Subjective: Continues to have intermittent lower abdominal cramping abdominal pain without nausea/vomiting. May precede defecation.  No new complaints today. Sister present at the bedside.   Objective:   Vital signs in last 24 hours: Temp:  [97.8 F (36.6 C)-98.8 F (37.1 C)] 98 F (36.7 C) (05/08 0939) Pulse Rate:  [91-110] 110 (05/08 0942) Resp:  [16-20] 20 (05/08 0942) BP: (105-124)/(63-77) 123/69 (05/08 0942) SpO2:  [94 %-96 %] 96 % (05/08 0942) Last BM Date: 01/03/20   General:   Alert 54 year old male in NAD.  Marked jaundice. Bilateral temporal wasting.  Eyes:  Moderate scleral icterus. Conjunctiva pink.  Heart: RRR, no murmur.  Pulm:  Breath sounds clear throughout.  Abdomen: Protuberant but not tense. Subtle fluid wave.  Nontender. No hepatomegaly. + Splenomegaly. Normal bowel sounds. No leakage noted from paracentesis site.  Extremities:  Bilateral lower extremity 1+ edema. Neurologic:  Alert and  oriented x4; no asterixis or clonus.  Psych:  Alert and cooperative. Normal mood and affect.  Paracentesis 01/02/2020: 1.4 L of clear golden yellow fluid removed.  No evidence of SBP. SAAG < 1.1  Ascitic protein > 2.5. Cytology showed reactive mesothelial cells. Urine sodium < 10. Normal BUN/Cr. Urine protein negative.   ECHO 01/02/2020: 1. Left ventricular ejection fraction, by estimation, is 70 to 75%. The left ventricle has hyperdynamic function. The left ventricle has no regional wall motion abnormalities. There is moderate left ventricular hypertrophy. Left ventricular diastolic parameters are consistent with Grade I diastolic dysfunction (impaired relaxation). 2. Right ventricular systolic function is normal. The right ventricular size is normal.  Lab Results: Recent Labs    01/03/20 0519 01/04/20 0457 01/05/20 0626  WBC 6.4 5.2 7.9  HGB 11.4* 11.0* 11.5*  HCT 33.3* 31.6*  33.6*  PLT 47* 45* 48*   BMET Recent Labs    01/03/20 0519 01/04/20 0457 01/05/20 0626  NA 136 136 133*  K 4.1 3.7 3.9  CL 102 105 100  CO2 25 23 24   GLUCOSE 111* 118* 146*  BUN 17 21* 26*  CREATININE 0.76 1.02 1.24  CALCIUM 8.5* 8.2* 7.9*   LFT Recent Labs    01/05/20 0626  PROT 6.4*  ALBUMIN 1.8*  AST 118*  ALT 79*  ALKPHOS 140*  BILITOT 28.9*   PT/INR Recent Labs    01/04/20 0457 01/05/20 0626  LABPROT 30.1* 28.1*  INR 3.0* 2.7*    Assessment / Plan:  43. 54 year old male with acute alcoholichepatitis, chronicalcoholic cirrhosis with ascites. MELD 32. Discriminant function 101. CTAP 01/01/2020 consistent with cirrhosis with moderate ascites, splenomegaly with a spontaneous splenorenal shunt and cholelithiasis. Discriminant function 101.9-> 115. Likely due to alcohol, but autoimmune labs including IgG and SMAb are elevated.  ANA pending. Prednisolone 40 mg p.o. daily started on 5/5. Hepatic labs are slightly worse today.  INR has improved to 2.7. He has been complaining of intermittent epigastric, central, and lower abdominal pain throughout this admission. Paracentesis 5/5 consistent with portal hypertension. No SBP. Spironolactone started 5/6 with slow decline in renal function since then, creatinine is now 1.24.  PPI started 5/6.  He is not a liver transplant candidate due to alcohol abuse. He intends to enter inpatient rehab after hospital discharge.  -Continue Prednisolone 40 mg daily, check Lille score on day 7 -Hold spironolactone, resume at 25 mg daily when creatinine stabilizes -Follow renal function closely  -Ondansetron 4mg  po or IV Q 6hrs  PRN -Pantoprazole 40mg  po QD -2 g low-sodium diet -Eventual EGD due to heme + stools, abdominal pain and to screen for esophageal/gastric varices prior to discharge, timing to be determined by Dr. Tarri Glenn. INR 3.0  today.  - Discontinue lactulose as this may be contributing to his abdominal symptoms. Add Miralax if  he is not have at least 2 BM daily.   2. Hyponatremia due to portal hypertension, Na now 133  3. Hypokalemia, resolved  4. Macrocytic anemia. Hemoglobin stable.  5. Coagulopathy secondary to cirrhosis. Some improvement in INR with Vitamin K.  6. 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  Platelets stable at 48 today.     LOS: 4 days   [place]  01/05/2020, 8:31am

## 2020-01-05 NOTE — Progress Notes (Signed)
[facility]                                                                              Patient Demographics  Jesse Esparza, is a 54 y.o. male, DOB - 1965-10-23, [id number]  Admit date - 01/01/2020   Admitting Physician Emeterio Reeve, DO  Outpatient Primary MD for the patient is Charlott Rakes, MD  Outpatient specialists:   LOS - 4  days   Medical records reviewed and are as summarized below:    Chief Complaint  Patient presents with  . abdominal distention  . Leg Swelling  . Jaundice       Brief summary   Patient is a 54 year old male with history of known cirrhosis, GERD, hyperlipidemia, hypertension, obesity presented with 3 weeks of worsening fatigue, nausea, decreased appetite.  Also reported lower extremity edema, worsening abdominal distention, yellowing of the skin and eyes.  Patient reports he has been consuming 4 to 6 cans of beers per day for years and stopped 2 weeks ago.  Former use of liquor Showed cirrhotic liver with splenomegaly and spontaneous splenorenal shunt, moderate ascites with cholelithiasis. Labs with transaminitis, elevated bili, hypoalbuminemia, hypercalcemia, elevated INR, elevated ammonia, macrocytic anemia   Assessment & Plan    Principal problem   Cirrhosis ([place]) with associated transaminitis, elevated bilirubin, thrombocytopenia, alcoholic hepatitis -Alcohol-related advanced liver disease with heavy daily alcohol use until 2 weeks ago, findings consistent with alcoholic hepatitis -Had an EGD in 2017 which showed no portal hypertension -Status post paracentesis on 5/5, not consistent with SBP -Patient started on prednisolone, continue  -Hold off on spironolactone, creatinine trending up, feeling dizzy  -Total bili, ammonia level elevated.  Continue prednisolone, continue MiraLAX, lactulose.  No BM this morning.    Alcoholic hepatitis with ascites -Status post paracentesis on 5/5, 1.4 L removed. -Status post  albumin 25 g x 1 -Albumin level 1.8   History of alcohol abuse - counseled on alcohol cessation, continue thiamine, folic acid  Epistaxis -Improving, INR improving, 2.7.  H&H stable  Hyponatremia -Sodium trending down, hold Aldactone  Hypokalemia -Resolved, stable  Macrocytic anemia -Likely likely due to #1, continue thiamine, folate  Thrombocytopenia secondary to cirrhosis with splenomegaly, coagulopathy -INR improving -Vitamin K 10 mg p.o. daily for 3 days  Intermittent GI bleed -Patient reported history of black solid stool and intermittent bright red blood over the past 2 months -FOBT positive.  Per GI EGD to assess for esophageal and gastric varices after INR is corrected -Colonoscopy outpatient  Obesity Estimated body mass index is 42.28 kg/m as calculated from the following:   Height as of this encounter: 5\' 5"  (1.651 m).   Weight as of this encounter: 115.3 kg.  Code Status: Full code DVT Prophylaxis: SCDs Family Communication: Discussed all imaging results, lab results, explained to the patient and family member at bedside in detail   Disposition Plan:     Status is: Inpatient  Remains inpatient appropriate because:Inpatient level of care appropriate due to severity of illness   Dispo: The patient is from: Home              Anticipated d/c is to:  Home              Anticipated d/c date is: > 3 days              Patient currently is not medically stable to d/c.       Time Spent in minutes   78mins    Procedures:  Paracentesis  Consultants:   Interventional radiology Gastroenterology  Antimicrobials:   Anti-infectives (From admission, onward)   None         Medications  Scheduled Meds: . folic acid  1 mg Oral Daily  . multivitamin with minerals  1 tablet Oral Daily  . pantoprazole  40 mg Oral Daily  . polyethylene glycol  17 g Oral Daily  . prednisoLONE  40 mg Oral Daily  . thiamine  100 mg Oral Daily   Continuous  Infusions: PRN Meds:.      Subjective:   Jhonathon Rosenboom was seen and examined today.  States epistaxis is improving however has intermittent abdominal cramping, pain.  No nausea or vomiting.  Also complaining of dizziness on ambulation.  Denies chest pain, shortness of breath, new weakness, numbess, tingling. No acute events overnight.  No fevers, alert and oriented  Objective:   Vitals:   01/05/20 0544 01/05/20 0939 01/05/20 0940 01/05/20 0942  BP: 105/77 124/73 110/63 123/69  Pulse: 91 97 100 (!) 110  Resp: 20 16 18 20   Temp: 97.8 F (36.6 C) 98 F (36.7 C)    TempSrc: Oral Oral    SpO2: 96% 94% 96% 96%  Weight:      Height:        Intake/Output Summary (Last 24 hours) at 01/05/2020 1143 Last data filed at 01/05/2020 0827 Gross per 24 hour  Intake 600 ml  Output 1 ml  Net 599 ml     Wt Readings from Last 3 Encounters:  01/01/20 115.3 kg  11/07/17 99.3 kg  10/10/17 98 kg   Physical Exam  General: Alert and oriented x 3, NAD, scleral icterus  Cardiovascular: S1 S2 clear, RRR.  1+ pedal edema b/l  Respiratory: CTAB, no wheezing, rales or rhonchi  Gastrointestinal: Soft, nontender, distended, NBS  Ext: 1+ pedal edema bilaterally  Neuro: no new deficits  Musculoskeletal: No cyanosis, clubbing  Skin: No rashes  Psych: Normal affect and demeanor, alert and oriented x3    Data Reviewed:  I have personally reviewed following labs and imaging studies  Micro Results Recent Results (from the past 240 hour(s))  Respiratory Panel by RT PCR (Flu A&B, Covid) - Nasopharyngeal Swab     Status: None   Collection Time: 01/01/20  2:07 PM   Specimen: Nasopharyngeal Swab  Result Value Ref Range Status   SARS Coronavirus 2 by RT PCR NEGATIVE NEGATIVE Final    Comment: (NOTE) SARS-CoV-2 target nucleic acids are NOT DETECTED. The SARS-CoV-2 RNA is generally detectable in upper respiratoy specimens during the acute phase of infection. The lowest concentration of  SARS-CoV-2 viral copies this assay can detect is 131 copies/mL. A negative result does not preclude SARS-Cov-2 infection and should not be used as the sole basis for treatment or other patient management decisions. A negative result may occur with  improper specimen collection/handling, submission of specimen other than nasopharyngeal swab, presence of viral mutation(s) within the areas targeted by this assay, and inadequate number of viral copies (<131 copies/mL). A negative result must be combined with clinical observations, patient history, and epidemiological information. The expected result is Negative. Fact Sheet for  Patients:  PinkCheek.be Fact Sheet for Healthcare Providers:  GravelBags.it This test is not yet ap proved or cleared by the [place] and  has been authorized for detection and/or diagnosis of SARS-CoV-2 by FDA under an Emergency Use Authorization (EUA). This EUA will remain  in effect (meaning this test can be used) for the duration of the COVID-19 declaration under Section 564(b)(1) of the Act, 21 U.S.C. section 360bbb-3(b)(1), unless the authorization is terminated or revoked sooner.    Influenza A by PCR NEGATIVE NEGATIVE Final   Influenza B by PCR NEGATIVE NEGATIVE Final    Comment: (NOTE) The Xpert Xpress SARS-CoV-2/FLU/RSV assay is intended as an aid in  the diagnosis of influenza from Nasopharyngeal swab specimens and  should not be used as a sole basis for treatment. Nasal washings and  aspirates are unacceptable for Xpert Xpress SARS-CoV-2/FLU/RSV  testing. Fact Sheet for Patients: PinkCheek.be Fact Sheet for Healthcare Providers: GravelBags.it This test is not yet approved or cleared by the [place] FDA and  has been authorized for detection and/or diagnosis of SARS-CoV-2 by  FDA under an Emergency Use Authorization (EUA).  This EUA will remain  in effect (meaning this test can be used) for the duration of the  Covid-19 declaration under Section 564(b)(1) of the Act, 21  U.S.C. section 360bbb-3(b)(1), unless the authorization is  terminated or revoked. Performed at [facility], [place] [address] [place], [place] [address]   Culture, body fluid-bottle     Status: None (Preliminary result)   Collection Time: 01/02/20 12:36 PM   Specimen: Fluid  Result Value Ref Range Status   Specimen Description FLUID PERITONEAL  Final   Special Requests BOTTLES DRAWN AEROBIC AND ANAEROBIC 10CC  Final   Culture   Final    NO GROWTH 3 DAYS Performed at [facility], [place] [address] [place], [place] [address]    Report Status PENDING  Incomplete  Gram stain     Status: None   Collection Time: 01/02/20 12:36 PM   Specimen: Fluid  Result Value Ref Range Status   Specimen Description FLUID PERITONEAL  Final   Special Requests NONE  Final   Gram Stain   Final    FEW WBC PRESENT, PREDOMINANTLY MONONUCLEAR NO ORGANISMS SEEN Performed at [facility], [address] [place], [place] [address]    Report Status 01/02/2020 FINAL  Final    Radiology Reports CT ABDOMEN PELVIS W CONTRAST  Result Date: 01/01/2020 CLINICAL DATA:  Cirrhosis, weight gain, abdominal distension, leg swelling, jaundice, history GERD, hypertension EXAM: CT ABDOMEN AND PELVIS WITH CONTRAST TECHNIQUE: Multidetector CT imaging of the abdomen and pelvis was performed using the standard protocol following bolus administration of intravenous contrast. Sagittal and coronal MPR images reconstructed from axial data set. CONTRAST:  158mL OMNIPAQUE IOHEXOL 300 MG/ML SOLN IV. No oral contrast. COMPARISON:  04/05/2016 FINDINGS: Lower chest: Subsegmental atelectasis RIGHT lower lobe. Tiny RIGHT pleural effusion. Hepatobiliary: Dependent calculi within gallbladder. Gallbladder wall appears thickened, nonspecific in the setting of ascites.  Heterogeneous cirrhotic appearing liver without discrete mass. No intrahepatic or extrahepatic biliary dilatation. Pancreas: Normal appearance Spleen: Mildly enlarged, 14.6 x 13.6 x 5.3 cm (volume = 550 cm^3). No focal mass. Adrenals/Urinary Tract: Adrenal glands, kidneys, ureters, and bladder normal appearance Stomach/Bowel: Normal appendix. Stomach and bowel loops normal appearance Vascular/Lymphatic: Aorta normal caliber with mild scattered atherosclerotic calcifications. Vascular structures patent. Large perisplenic collateral extending to LEFT renal vein consistent with spontaneous splenorenal shunt. Major vascular  structures patent. No adenopathy. Reproductive: Unremarkable seminal vesicles. Prostate gland minimally enlarged at 4.8 x 3.5 cm image 96. Other: Moderate ascites. No free air. No hernia. Scattered edema/infiltration of tissue planes in abdomen. 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 Moderate ascites. Cholelithiasis. Minimal prostatic enlargement. Electronically Signed   By: Lavonia Dana M.D.   On: 01/01/2020 12:54   US Paracentesis  Result Date: 01/02/2020 INDICATION: Patient with history of acute alcoholic hepatitis/chronic alcoholic cirrhosis, ascites; request received for diagnostic and therapeutic paracentesis. EXAM: ULTRASOUND GUIDED DIAGNOSTIC AND THERAPEUTIC PARACENTESIS MEDICATIONS: None COMPLICATIONS: None immediate. PROCEDURE: Informed written consent was obtained from the patient after a discussion of the risks, benefits and alternatives to treatment. A timeout was performed prior to the initiation of the procedure. Initial ultrasound scanning demonstrates a small amount of ascites within the right lower abdominal quadrant. The right lower abdomen was prepped and draped in the usual sterile fashion. 1% lidocaine was used for local anesthesia. Following this, a 19 gauge, 10-cm, Yueh catheter was introduced. An  ultrasound image was saved for documentation purposes. The paracentesis was performed. The catheter was removed and a dressing was applied. The patient tolerated the procedure well without immediate post procedural complication. FINDINGS: A total of approximately 1.4 liters of clear, golden yellow fluid was removed. Samples were sent to the laboratory as requested by the clinical team. IMPRESSION: Successful ultrasound-guided diagnostic and therapeutic paracentesis yielding 1.4 liters of peritoneal fluid. Read by: Rowe Robert, PA-C Electronically Signed   By: Corrie Mckusick D.O.   On: 01/02/2020 12:44   ECHOCARDIOGRAM COMPLETE  Result Date: 01/02/2020    ECHOCARDIOGRAM REPORT   Patient Name:   RICCO PILOTO Date of Exam: 01/02/2020 Medical Rec #:  [id number]       Height:       65.0 in Accession #:    [id number]      Weight:       254.1 lb Date of Birth:  Aug 24, 1966      BSA:          2.190 m Patient Age:    39 years        BP:           111/67 mmHg Patient Gender: M               HR:           93 bpm. Exam Location:  Inpatient Procedure: 2D Echo, Cardiac Doppler and Color Doppler Indications:    R94.31 Abnormal EKG  History:        Patient has no prior history of Echocardiogram examinations.                 Risk Factors:Hypertension. ETOH. Edema. Ascites.  Sonographer:    Roseanna Rainbow RDCS Referring Phys: [id number] [place] Comments: Suboptimal parasternal window, no subcostal window, Technically difficult study due to poor echo windows and patient is morbidly obese. Image acquisition challenging due to patient body habitus. Study is to rule out right heart failure. IMPRESSIONS  1. Left ventricular ejection fraction, by estimation, is 70 to 75%. The left ventricle has hyperdynamic function. The left ventricle has no regional wall motion abnormalities. There is moderate left ventricular hypertrophy. Left ventricular diastolic parameters are consistent with Grade I diastolic dysfunction  (impaired relaxation).  2. Right ventricular systolic function is normal. The right ventricular size is normal.  3. The mitral valve is normal in structure. No evidence of mitral valve regurgitation.  No evidence of mitral stenosis.  4. The aortic valve was not well visualized. Aortic valve regurgitation is not visualized. No aortic stenosis is present.  5. The inferior vena cava is normal in size with greater than 50% respiratory variability, suggesting right atrial pressure of 3 mmHg. FINDINGS  Left Ventricle: Left ventricular ejection fraction, by estimation, is 70 to 75%. The left ventricle has hyperdynamic function. The left ventricle has no regional wall motion abnormalities. The left ventricular internal cavity size was normal in size. There is moderate left ventricular hypertrophy. Left ventricular diastolic parameters are consistent with Grade I diastolic dysfunction (impaired relaxation). Right Ventricle: The right ventricular size is normal. No increase in right ventricular wall thickness. Right ventricular systolic function is normal. Left Atrium: Left atrial size was normal in size. Right Atrium: Right atrial size was normal in size. Pericardium: There is no evidence of pericardial effusion. Mitral Valve: The mitral valve is normal in structure. Normal mobility of the mitral valve leaflets. No evidence of mitral valve regurgitation. No evidence of mitral valve stenosis. Tricuspid Valve: The tricuspid valve is normal in structure. Tricuspid valve regurgitation is not demonstrated. No evidence of tricuspid stenosis. Aortic Valve: The aortic valve was not well visualized. Aortic valve regurgitation is not visualized. No aortic stenosis is present. Pulmonic Valve: The pulmonic valve was normal in structure. Pulmonic valve regurgitation is not visualized. No evidence of pulmonic stenosis. Aorta: The aortic root is normal in size and structure. Venous: The inferior vena cava is normal in size with greater than  50% respiratory variability, suggesting right atrial pressure of 3 mmHg. IAS/Shunts: No atrial level shunt detected by color flow Doppler.  LEFT VENTRICLE PLAX 2D LVIDd:         4.08 cm     Diastology LVIDs:         2.54 cm     LV e' lateral:   9.79 cm/s LV PW:         1.46 cm     LV E/e' lateral: 9.0 LV IVS:        1.33 cm     LV e' medial:    7.40 cm/s LVOT diam:     2.10 cm     LV E/e' medial:  11.9 LV SV:         80 LV SV Index:   37 LVOT Area:     3.46 cm  LV Volumes (MOD) LV vol d, MOD A2C: 48.6 ml LV vol d, MOD A4C: 68.5 ml LV vol s, MOD A2C: 11.8 ml LV vol s, MOD A4C: 17.0 ml LV SV MOD A2C:     36.8 ml LV SV MOD A4C:     68.5 ml LV SV MOD BP:      45.2 ml RIGHT VENTRICLE RV S prime:     13.40 cm/s TAPSE (M-mode): 2.0 cm LEFT ATRIUM             Index       RIGHT ATRIUM           Index LA diam:        3.70 cm 1.69 cm/m  RA Area:     12.50 cm LA Vol (A2C):   31.2 ml 14.25 ml/m RA Volume:   20.40 ml  9.32 ml/m LA Vol (A4C):   46.5 ml 21.23 ml/m LA Biplane Vol: 40.8 ml 18.63 ml/m  AORTIC VALVE LVOT Vmax:   134.00 cm/s LVOT Vmean:  96.000 cm/s LVOT VTI:    0.231  m  AORTA Ao Root diam: 3.30 cm Ao Asc diam:  3.20 cm MITRAL VALVE MV Area (PHT): 3.31 cm     SHUNTS MV Decel Time: 229 msec     Systemic VTI:  0.23 m MV E velocity: 87.80 cm/s   Systemic Diam: 2.10 cm MV A velocity: 123.00 cm/s MV E/A ratio:  0.71 Candee Furbish MD Electronically signed by Candee Furbish MD Signature Date/Time: 01/02/2020/11:01:13 AM    Final     Lab Data:  CBC: Recent Labs  Lab 01/01/20 1018 01/02/20 0524 01/03/20 0519 01/04/20 0457 01/05/20 0626  WBC 6.8 7.1 6.4 5.2 7.9  NEUTROABS 5.4 5.5  --   --   --   HGB 12.3* 12.0* 11.4* 11.0* 11.5*  HCT 36.1* 34.1*  NOT PERFORMED 33.3* 31.6* 33.6*  MCV 108.1* 107.6* 108.1* 111.3* 108.7*  PLT 51* 54* 47* 45* 48*   Basic Metabolic Panel: Recent Labs  Lab 01/01/20 1018 01/02/20 0524 01/03/20 0519 01/04/20 0457 01/05/20 0626  NA 130* 133* 136 136 133*  K 3.3* 3.2* 4.1 3.7  3.9  CL 99 100 102 105 100  CO2 25 25 25 23 24   GLUCOSE 89 83 111* 118* 146*  BUN 13 15 17  21* 26*  CREATININE 0.63 0.80 0.76 1.02 1.24  CALCIUM 7.8* 7.9* 8.5* 8.2* 7.9*   GFR: Estimated Creatinine Clearance: 79 mL/min (by C-G formula based on SCr of 1.24 mg/dL). Liver Function Tests: Recent Labs  Lab 01/01/20 1018 01/02/20 0524 01/03/20 0519 01/04/20 0457 01/05/20 0626  AST 181* 168* 155* 123* 118*  ALT 69* 71* 73* 71* 79*  ALKPHOS 151* 137* 127* 122 140*  BILITOT 26.0* 26.2* 29.3* 26.0* 28.9*  PROT 6.9 6.7 6.6 6.1* 6.4*  ALBUMIN 1.8* 1.7* 2.0* 1.8* 1.8*   No results for input(s): LIPASE, AMYLASE in the last 168 hours. Recent Labs  Lab 01/01/20 1018 01/04/20 0458 01/05/20 0626  AMMONIA 48* 107* 122*   Coagulation Profile: Recent Labs  Lab 01/01/20 1018 01/02/20 0524 01/03/20 0519 01/04/20 0457 01/05/20 0626  INR 3.0* 3.3* 3.2* 3.0* 2.7*   Cardiac Enzymes: No results for input(s): CKTOTAL, CKMB, CKMBINDEX, TROPONINI in the last 168 hours. BNP (last 3 results) No results for input(s): PROBNP in the last 8760 hours. HbA1C: No results for input(s): HGBA1C in the last 72 hours. CBG: No results for input(s): GLUCAP in the last 168 hours. Lipid Profile: No results for input(s): CHOL, HDL, LDLCALC, TRIG, CHOLHDL, LDLDIRECT in the last 72 hours. Thyroid Function Tests: No results for input(s): TSH, T4TOTAL, FREET4, T3FREE, THYROIDAB in the last 72 hours. Anemia Panel: No results for input(s): VITAMINB12, FOLATE, FERRITIN, TIBC, IRON, RETICCTPCT in the last 72 hours. Urine analysis:    Component Value Date/Time   COLORURINE AMBER (A) 01/01/2020 1031   APPEARANCEUR HAZY (A) 01/01/2020 1031   LABSPEC 1.013 01/01/2020 1031   PHURINE 6.0 01/01/2020 1031   GLUCOSEU 50 (A) 01/01/2020 1031   HGBUR SMALL (A) 01/01/2020 1031   BILIRUBINUR MODERATE (A) 01/01/2020 1031   BILIRUBINUR large (A) 03/19/2016 1103   BILIRUBINUR small 12/25/2014 1001   KETONESUR 5 (A)  01/01/2020 1031   PROTEINUR NEGATIVE 01/01/2020 1031   UROBILINOGEN 4.0 03/19/2016 1103   UROBILINOGEN 0.2 01/21/2015 0623   NITRITE NEGATIVE 01/01/2020 1031   LEUKOCYTESUR NEGATIVE 01/01/2020 1031     Skylah Delauter M.D. [facility] 01/05/2020, 11:43 AM   Call night coverage person covering after 7pm

## 2020-01-06 DIAGNOSIS — K7682 Hepatic encephalopathy: Secondary | ICD-10-CM

## 2020-01-06 DIAGNOSIS — K7290 Hepatic failure, unspecified without coma: Secondary | ICD-10-CM

## 2020-01-06 LAB — CBC
HCT: 33.5 % — ABNORMAL LOW (ref 39.0–52.0)
Hemoglobin: 11.7 g/dL — ABNORMAL LOW (ref 13.0–17.0)
MCH: 37.7 pg — ABNORMAL HIGH (ref 26.0–34.0)
MCHC: 34.9 g/dL (ref 30.0–36.0)
MCV: 108.1 fL — ABNORMAL HIGH (ref 80.0–100.0)
Platelets: 50 10*3/uL — ABNORMAL LOW (ref 150–400)
RBC: 3.1 MIL/uL — ABNORMAL LOW (ref 4.22–5.81)
RDW: 17.7 % — ABNORMAL HIGH (ref 11.5–15.5)
WBC: 10.2 10*3/uL (ref 4.0–10.5)
nRBC: 0 % (ref 0.0–0.2)

## 2020-01-06 LAB — COMPREHENSIVE METABOLIC PANEL
ALT: 85 U/L — ABNORMAL HIGH (ref 0–44)
AST: 123 U/L — ABNORMAL HIGH (ref 15–41)
Albumin: 1.8 g/dL — ABNORMAL LOW (ref 3.5–5.0)
Alkaline Phosphatase: 130 U/L — ABNORMAL HIGH (ref 38–126)
Anion gap: 9 (ref 5–15)
BUN: 28 mg/dL — ABNORMAL HIGH (ref 6–20)
CO2: 25 mmol/L (ref 22–32)
Calcium: 7.9 mg/dL — ABNORMAL LOW (ref 8.9–10.3)
Chloride: 101 mmol/L (ref 98–111)
Creatinine, Ser: 1.19 mg/dL (ref 0.61–1.24)
GFR calc Af Amer: >60 mL/min (ref >60)
GFR calc non Af Amer: >60 mL/min (ref >60)
Glucose, Bld: 105 mg/dL — ABNORMAL HIGH (ref 70–99)
Potassium: 4 mmol/L (ref 3.5–5.1)
Sodium: 135 mmol/L (ref 135–145)
Total Bilirubin: 28.4 mg/dL (ref 0.3–1.2)
Total Protein: 6.4 g/dL — ABNORMAL LOW (ref 6.5–8.1)

## 2020-01-06 LAB — PROTIME-INR
INR: 2.7 — ABNORMAL HIGH (ref 0.8–1.2)
Prothrombin Time: 27.5 seconds — ABNORMAL HIGH (ref 11.4–15.2)

## 2020-01-06 LAB — AMMONIA: Ammonia: 99 umol/L — ABNORMAL HIGH (ref 9–35)

## 2020-01-06 MED ORDER — MECLIZINE HCL 25 MG PO TABS
25.0000 mg | ORAL_TABLET | Freq: Once | ORAL | Status: AC
  Administered 2020-01-06: 25 mg via ORAL
  Filled 2020-01-06: qty 1

## 2020-01-06 MED ORDER — SUCRALFATE 1 G PO TABS
1.0000 g | ORAL_TABLET | Freq: Three times a day (TID) | ORAL | Status: DC
  Administered 2020-01-06 – 2020-01-07 (×6): 1 g via ORAL
  Filled 2020-01-06 (×5): qty 1

## 2020-01-06 MED ORDER — RIFAXIMIN 550 MG PO TABS
550.0000 mg | ORAL_TABLET | Freq: Two times a day (BID) | ORAL | Status: DC
  Administered 2020-01-06 – 2020-01-11 (×11): 550 mg via ORAL
  Filled 2020-01-06 (×11): qty 1

## 2020-01-06 NOTE — Progress Notes (Addendum)
[facility]                                                                              Patient Demographics  Jesse Esparza, is a 54 y.o. male, DOB - [birth year], [id number]  Admit date - 01/01/2020   Admitting Physician Emeterio Reeve, DO  Outpatient Primary MD for the patient is Charlott Rakes, MD  Outpatient specialists:   LOS - 5  days   Medical records reviewed and are as summarized below:    Chief Complaint  Patient presents with  . abdominal distention  . Leg Swelling  . Jaundice       Brief summary   Patient is a 54 year old male with history of known cirrhosis, GERD, hyperlipidemia, hypertension, obesity presented with 3 weeks of worsening fatigue, nausea, decreased appetite.  Also reported lower extremity edema, worsening abdominal distention, yellowing of the skin and eyes.  Patient reports he has been consuming 4 to 6 cans of beers per day for years and stopped 2 weeks ago.  Former use of liquor Showed cirrhotic liver with splenomegaly and spontaneous splenorenal shunt, moderate ascites with cholelithiasis. Labs with transaminitis, elevated bili, hypoalbuminemia, hypercalcemia, elevated INR, elevated ammonia, macrocytic anemia   Assessment & Plan    Principal problem   Cirrhosis ([place]) with associated transaminitis, elevated bilirubin, thrombocytopenia, alcoholic hepatitis -CT AP consistent with cirrhosis, moderate ascites, splenomegaly with a spontaneous splenorenal shunt and cholelithiasis. -Alcohol-related advanced liver disease with heavy daily alcohol use until 2 weeks ago, findings consistent with alcoholic hepatitis. MELD 32, DF 101, not a liver transplant candidate due to alcohol abuse. -Had an EGD in 2017 which showed no portal hypertension -Status post paracentesis on 5/5, not consistent with SBP -Continue prednisolone, started on 5/5, day #5.  GI following -Patient was complaining of dizziness, creatinine trending down,  spironolactone discontinued.  Lactulose was discontinued per GI -Ammonia level 99, total bilirubin hopefully starting to trend down 28.4 (<- [id number])    Alcoholic hepatitis with ascites, hepatic encephalopathy -Status post paracentesis on 5/5, 1.4 L removed. -Status post albumin 25 g x 1, albumin level 1.8 -Continue MiraLAX, added rifaximin  History of alcohol abuse - counseled on alcohol cessation, continue thiamine, folic acid -Not a liver transplant candidate due to alcohol abuse history, interested in entering inpatient rehab after hospital discharge  Epistaxis -Resolved, INR 2.7, stable, H&H stable   Hyponatremia -Improving, continue to hold Aldactone  Hypokalemia -Resolved  Macrocytic anemia -Likely likely due to #1, continue thiamine, folate  Thrombocytopenia secondary to cirrhosis with splenomegaly, coagulopathy -INR improving, platelets improving -Vitamin K 10 mg p.o. daily for 3 days  Intermittent GI bleed -Patient reported history of black solid stool and intermittent bright red blood over the past 2 months -FOBT positive.  Per GI EGD to assess for esophageal and gastric varices after INR is corrected -Colonoscopy outpatient  Obesity Estimated body mass index is 42.28 kg/m as calculated from the following:   Height as of this encounter: 5\' 5"  (1.651 m).   Weight as of this encounter: 115.3 kg.  Code Status: Full code DVT Prophylaxis: SCDs Family Communication: Discussed all imaging results, lab results, explained to  the patient.  Discussed with sister on 5/8   Disposition Plan:     Status is: Inpatient  Remains inpatient appropriate because:Inpatient level of care appropriate due to severity of illness   Dispo: The patient is from: Home              Anticipated d/c is to: Home              Anticipated d/c date is: > 3 days              Patient currently is not medically stable to d/c.       Time Spent in minutes   66mins    Procedures:    Paracentesis  Consultants:   Interventional radiology Gastroenterology  Antimicrobials:   Anti-infectives (From admission, onward)   None         Medications  Scheduled Meds: . folic acid  1 mg Oral Daily  . multivitamin with minerals  1 tablet Oral Daily  . pantoprazole  40 mg Oral Daily  . polyethylene glycol  17 g Oral Daily  . prednisoLONE  40 mg Oral Daily  . thiamine  100 mg Oral Daily   Continuous Infusions: PRN Meds:.      Subjective:   Trez Conliffe was seen and examined today.  Epistaxis resolved, continues to complain of mild epigastric discomfort, dizziness, no nausea or vomiting.  Had 3 BMs yesterday, one this morning.    Denies chest pain, shortness of breath, new weakness, numbess, tingling. No acute events overnight.  Currently alert and oriented no fevers  Objective:   Vitals:   01/05/20 0942 01/05/20 1241 01/05/20 2119 01/06/20 0623  BP: 123/69 116/72 122/72 108/65  Pulse: (!) 110 90 88 (!) 103  Resp: 20 14 19 20   Temp:  97.9 F (36.6 C) 98 F (36.7 C) 98.5 F (36.9 C)  TempSrc:  Oral Oral Oral  SpO2: 96% 97% 95% 95%  Weight:      Height:        Intake/Output Summary (Last 24 hours) at 01/06/2020 1112 Last data filed at 01/05/2020 1500 Gross per 24 hour  Intake 240 ml  Output --  Net 240 ml     Wt Readings from Last 3 Encounters:  01/01/20 115.3 kg  11/07/17 99.3 kg  10/10/17 98 kg   Physical Exam  General: Alert and oriented x 3, NAD, scleral icterus  Cardiovascular: S1 S2 clear, RRR.  1+ pedal edema b/l  Respiratory: CTAB, no wheezing, rales or rhonchi  Gastrointestinal: Soft, nontender, distended, NBS  Ext: 1+  pedal edema bilaterally  Neuro: no new deficits  Musculoskeletal: No cyanosis, clubbing  Skin: No rashes  Psych: Normal affect and demeanor, alert and oriented x3     Data Reviewed:  I have personally reviewed following labs and imaging studies  Micro Results Recent Results (from the past 240  hour(s))  Respiratory Panel by RT PCR (Flu A&B, Covid) - Nasopharyngeal Swab     Status: None   Collection Time: 01/01/20  2:07 PM   Specimen: Nasopharyngeal Swab  Result Value Ref Range Status   SARS Coronavirus 2 by RT PCR NEGATIVE NEGATIVE Final    Comment: (NOTE) SARS-CoV-2 target nucleic acids are NOT DETECTED. The SARS-CoV-2 RNA is generally detectable in upper respiratoy specimens during the acute phase of infection. The lowest concentration of SARS-CoV-2 viral copies this assay can detect is 131 copies/mL. A negative result does not preclude SARS-Cov-2 infection and should not be used as the  sole basis for treatment or other patient management decisions. A negative result may occur with  improper specimen collection/handling, submission of specimen other than nasopharyngeal swab, presence of viral mutation(s) within the areas targeted by this assay, and inadequate number of viral copies (<131 copies/mL). A negative result must be combined with clinical observations, patient history, and epidemiological information. The expected result is Negative. Fact Sheet for Patients:  PinkCheek.be Fact Sheet for Healthcare Providers:  GravelBags.it This test is not yet ap proved or cleared by the [place] FDA and  has been authorized for detection and/or diagnosis of SARS-CoV-2 by FDA under an Emergency Use Authorization (EUA). This EUA will remain  in effect (meaning this test can be used) for the duration of the COVID-19 declaration under Section 564(b)(1) of the Act, 21 U.S.C. section 360bbb-3(b)(1), unless the authorization is terminated or revoked sooner.    Influenza A by PCR NEGATIVE NEGATIVE Final   Influenza B by PCR NEGATIVE NEGATIVE Final    Comment: (NOTE) The Xpert Xpress SARS-CoV-2/FLU/RSV assay is intended as an aid in  the diagnosis of influenza from Nasopharyngeal swab specimens and  should not be used as  a sole basis for treatment. Nasal washings and  aspirates are unacceptable for Xpert Xpress SARS-CoV-2/FLU/RSV  testing. Fact Sheet for Patients: PinkCheek.be Fact Sheet for Healthcare Providers: GravelBags.it This test is not yet approved or cleared by the [place] FDA and  has been authorized for detection and/or diagnosis of SARS-CoV-2 by  FDA under an Emergency Use Authorization (EUA). This EUA will remain  in effect (meaning this test can be used) for the duration of the  Covid-19 declaration under Section 564(b)(1) of the Act, 21  U.S.C. section 360bbb-3(b)(1), unless the authorization is  terminated or revoked. Performed at [facility], [place] [address] [place], [place] [address]   Culture, body fluid-bottle     Status: None (Preliminary result)   Collection Time: 01/02/20 12:36 PM   Specimen: Fluid  Result Value Ref Range Status   Specimen Description FLUID PERITONEAL  Final   Special Requests BOTTLES DRAWN AEROBIC AND ANAEROBIC 10CC  Final   Culture   Final    NO GROWTH 4 DAYS Performed at [facility], [place] [address], [place], [place] [address]    Report Status PENDING  Incomplete  Gram stain     Status: None   Collection Time: 01/02/20 12:36 PM   Specimen: Fluid  Result Value Ref Range Status   Specimen Description FLUID PERITONEAL  Final   Special Requests NONE  Final   Gram Stain   Final    FEW WBC PRESENT, PREDOMINANTLY MONONUCLEAR NO ORGANISMS SEEN Performed at [facility], [address] [place], [place] [address]    Report Status 01/02/2020 FINAL  Final    Radiology Reports CT ABDOMEN PELVIS W CONTRAST  Result Date: 01/01/2020 CLINICAL DATA:  Cirrhosis, weight gain, abdominal distension, leg swelling, jaundice, history GERD, hypertension EXAM: CT ABDOMEN AND PELVIS WITH CONTRAST TECHNIQUE: Multidetector CT imaging of the abdomen and pelvis was performed using the  standard protocol following bolus administration of intravenous contrast. Sagittal and coronal MPR images reconstructed from axial data set. CONTRAST:  170mL OMNIPAQUE IOHEXOL 300 MG/ML SOLN IV. No oral contrast. COMPARISON:  04/05/2016 FINDINGS: Lower chest: Subsegmental atelectasis RIGHT lower lobe. Tiny RIGHT pleural effusion. Hepatobiliary: Dependent calculi within gallbladder. Gallbladder wall appears thickened, nonspecific in the setting of ascites. Heterogeneous cirrhotic appearing liver without discrete mass. No intrahepatic or extrahepatic  biliary dilatation. Pancreas: Normal appearance Spleen: Mildly enlarged, 14.6 x 13.6 x 5.3 cm (volume = 550 cm^3). No focal mass. Adrenals/Urinary Tract: Adrenal glands, kidneys, ureters, and bladder normal appearance Stomach/Bowel: Normal appendix. Stomach and bowel loops normal appearance Vascular/Lymphatic: Aorta normal caliber with mild scattered atherosclerotic calcifications. Vascular structures patent. Large perisplenic collateral extending to LEFT renal vein consistent with spontaneous splenorenal shunt. Major vascular structures patent. No adenopathy. Reproductive: Unremarkable seminal vesicles. Prostate gland minimally enlarged at 4.8 x 3.5 cm image 96. Other: Moderate ascites. No free air. No hernia. Scattered edema/infiltration of tissue planes in abdomen. 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 Moderate ascites. Cholelithiasis. Minimal prostatic enlargement. Electronically Signed   By: Lavonia Dana M.D.   On: 01/01/2020 12:54   US Paracentesis  Result Date: 01/02/2020 INDICATION: Patient with history of acute alcoholic hepatitis/chronic alcoholic cirrhosis, ascites; request received for diagnostic and therapeutic paracentesis. EXAM: ULTRASOUND GUIDED DIAGNOSTIC AND THERAPEUTIC PARACENTESIS MEDICATIONS: None COMPLICATIONS: None immediate. PROCEDURE: Informed written consent was obtained  from the patient after a discussion of the risks, benefits and alternatives to treatment. A timeout was performed prior to the initiation of the procedure. Initial ultrasound scanning demonstrates a small amount of ascites within the right lower abdominal quadrant. The right lower abdomen was prepped and draped in the usual sterile fashion. 1% lidocaine was used for local anesthesia. Following this, a 19 gauge, 10-cm, Yueh catheter was introduced. An ultrasound image was saved for documentation purposes. The paracentesis was performed. The catheter was removed and a dressing was applied. The patient tolerated the procedure well without immediate post procedural complication. FINDINGS: A total of approximately 1.4 liters of clear, golden yellow fluid was removed. Samples were sent to the laboratory as requested by the clinical team. IMPRESSION: Successful ultrasound-guided diagnostic and therapeutic paracentesis yielding 1.4 liters of peritoneal fluid. Read by: Rowe Robert, PA-C Electronically Signed   By: Corrie Mckusick D.O.   On: 01/02/2020 12:44   ECHOCARDIOGRAM COMPLETE  Result Date: 01/02/2020    ECHOCARDIOGRAM REPORT   Patient Name:   MALAKIE CRUPI Date of Exam: 01/02/2020 Medical Rec #:  [id number]       Height:       65.0 in Accession #:    [id number]      Weight:       254.1 lb Date of Birth:  February 19, 1966      BSA:          2.190 m Patient Age:    10 years        BP:           111/67 mmHg Patient Gender: M               HR:           93 bpm. Exam Location:  Inpatient Procedure: 2D Echo, Cardiac Doppler and Color Doppler Indications:    R94.31 Abnormal EKG  History:        Patient has no prior history of Echocardiogram examinations.                 Risk Factors:Hypertension. ETOH. Edema. Ascites.  Sonographer:    Roseanna Rainbow RDCS Referring Phys: [id number] [place] Comments: Suboptimal parasternal window, no subcostal window, Technically difficult study due to poor echo windows and  patient is morbidly obese. Image acquisition challenging due to patient body habitus. Study is to rule out right heart failure. IMPRESSIONS  1. Left ventricular ejection fraction, by estimation,  is 70 to 75%. The left ventricle has hyperdynamic function. The left ventricle has no regional wall motion abnormalities. There is moderate left ventricular hypertrophy. Left ventricular diastolic parameters are consistent with Grade I diastolic dysfunction (impaired relaxation).  2. Right ventricular systolic function is normal. The right ventricular size is normal.  3. The mitral valve is normal in structure. No evidence of mitral valve regurgitation. No evidence of mitral stenosis.  4. The aortic valve was not well visualized. Aortic valve regurgitation is not visualized. No aortic stenosis is present.  5. The inferior vena cava is normal in size with greater than 50% respiratory variability, suggesting right atrial pressure of 3 mmHg. FINDINGS  Left Ventricle: Left ventricular ejection fraction, by estimation, is 70 to 75%. The left ventricle has hyperdynamic function. The left ventricle has no regional wall motion abnormalities. The left ventricular internal cavity size was normal in size. There is moderate left ventricular hypertrophy. Left ventricular diastolic parameters are consistent with Grade I diastolic dysfunction (impaired relaxation). Right Ventricle: The right ventricular size is normal. No increase in right ventricular wall thickness. Right ventricular systolic function is normal. Left Atrium: Left atrial size was normal in size. Right Atrium: Right atrial size was normal in size. Pericardium: There is no evidence of pericardial effusion. Mitral Valve: The mitral valve is normal in structure. Normal mobility of the mitral valve leaflets. No evidence of mitral valve regurgitation. No evidence of mitral valve stenosis. Tricuspid Valve: The tricuspid valve is normal in structure. Tricuspid valve regurgitation  is not demonstrated. No evidence of tricuspid stenosis. Aortic Valve: The aortic valve was not well visualized. Aortic valve regurgitation is not visualized. No aortic stenosis is present. Pulmonic Valve: The pulmonic valve was normal in structure. Pulmonic valve regurgitation is not visualized. No evidence of pulmonic stenosis. Aorta: The aortic root is normal in size and structure. Venous: The inferior vena cava is normal in size with greater than 50% respiratory variability, suggesting right atrial pressure of 3 mmHg. IAS/Shunts: No atrial level shunt detected by color flow Doppler.  LEFT VENTRICLE PLAX 2D LVIDd:         4.08 cm     Diastology LVIDs:         2.54 cm     LV e' lateral:   9.79 cm/s LV PW:         1.46 cm     LV E/e' lateral: 9.0 LV IVS:        1.33 cm     LV e' medial:    7.40 cm/s LVOT diam:     2.10 cm     LV E/e' medial:  11.9 LV SV:         80 LV SV Index:   37 LVOT Area:     3.46 cm  LV Volumes (MOD) LV vol d, MOD A2C: 48.6 ml LV vol d, MOD A4C: 68.5 ml LV vol s, MOD A2C: 11.8 ml LV vol s, MOD A4C: 17.0 ml LV SV MOD A2C:     36.8 ml LV SV MOD A4C:     68.5 ml LV SV MOD BP:      45.2 ml RIGHT VENTRICLE RV S prime:     13.40 cm/s TAPSE (M-mode): 2.0 cm LEFT ATRIUM             Index       RIGHT ATRIUM           Index LA diam:  3.70 cm 1.69 cm/m  RA Area:     12.50 cm LA Vol (A2C):   31.2 ml 14.25 ml/m RA Volume:   20.40 ml  9.32 ml/m LA Vol (A4C):   46.5 ml 21.23 ml/m LA Biplane Vol: 40.8 ml 18.63 ml/m  AORTIC VALVE LVOT Vmax:   134.00 cm/s LVOT Vmean:  96.000 cm/s LVOT VTI:    0.231 m  AORTA Ao Root diam: 3.30 cm Ao Asc diam:  3.20 cm MITRAL VALVE MV Area (PHT): 3.31 cm     SHUNTS MV Decel Time: 229 msec     Systemic VTI:  0.23 m MV E velocity: 87.80 cm/s   Systemic Diam: 2.10 cm MV A velocity: 123.00 cm/s MV E/A ratio:  0.71 Candee Furbish MD Electronically signed by Candee Furbish MD Signature Date/Time: 01/02/2020/11:01:13 AM    Final     Lab Data:  CBC: Recent Labs  Lab  01/01/20 1018 01/01/20 1018 01/02/20 0524 01/03/20 0519 01/04/20 0457 01/05/20 0626 01/06/20 0530  WBC 6.8   < > 7.1 6.4 5.2 7.9 10.2  NEUTROABS 5.4  --  5.5  --   --   --   --   HGB 12.3*   < > 12.0* 11.4* 11.0* 11.5* 11.7*  HCT 36.1*   < > 34.1*  NOT PERFORMED 33.3* 31.6* 33.6* 33.5*  MCV 108.1*   < > 107.6* 108.1* 111.3* 108.7* 108.1*  PLT 51*   < > 54* 47* 45* 48* 50*   < > = values in this interval not displayed.   Basic Metabolic Panel: Recent Labs  Lab 01/02/20 0524 01/03/20 0519 01/04/20 0457 01/05/20 0626 01/06/20 0530  NA 133* 136 136 133* 135  K 3.2* 4.1 3.7 3.9 4.0  CL 100 102 105 100 101  CO2 25 25 23 24 25   GLUCOSE 83 111* 118* 146* 105*  BUN 15 17 21* 26* 28*  CREATININE 0.80 0.76 1.02 1.24 1.19  CALCIUM 7.9* 8.5* 8.2* 7.9* 7.9*   GFR: Estimated Creatinine Clearance: 82.3 mL/min (by C-G formula based on SCr of 1.19 mg/dL). Liver Function Tests: Recent Labs  Lab 01/02/20 0524 01/03/20 0519 01/04/20 0457 01/05/20 0626 01/06/20 0530  AST 168* 155* 123* 118* 123*  ALT 71* 73* 71* 79* 85*  ALKPHOS 137* 127* 122 140* 130*  BILITOT 26.2* 29.3* 26.0* 28.9* 28.4*  PROT 6.7 6.6 6.1* 6.4* 6.4*  ALBUMIN 1.7* 2.0* 1.8* 1.8* 1.8*   No results for input(s): LIPASE, AMYLASE in the last 168 hours. Recent Labs  Lab 01/01/20 1018 01/04/20 0458 01/05/20 0626 01/06/20 0530  AMMONIA 48* 107* 122* 99*   Coagulation Profile: Recent Labs  Lab 01/02/20 0524 01/03/20 0519 01/04/20 0457 01/05/20 0626 01/06/20 0530  INR 3.3* 3.2* 3.0* 2.7* 2.7*   Cardiac Enzymes: No results for input(s): CKTOTAL, CKMB, CKMBINDEX, TROPONINI in the last 168 hours. BNP (last 3 results) No results for input(s): PROBNP in the last 8760 hours. HbA1C: No results for input(s): HGBA1C in the last 72 hours. CBG: No results for input(s): GLUCAP in the last 168 hours. Lipid Profile: No results for input(s): CHOL, HDL, LDLCALC, TRIG, CHOLHDL, LDLDIRECT in the last 72  hours. Thyroid Function Tests: No results for input(s): TSH, T4TOTAL, FREET4, T3FREE, THYROIDAB in the last 72 hours. Anemia Panel: No results for input(s): VITAMINB12, FOLATE, FERRITIN, TIBC, IRON, RETICCTPCT in the last 72 hours. Urine analysis:    Component Value Date/Time   COLORURINE AMBER (A) 01/01/2020 1031   APPEARANCEUR HAZY (A) 01/01/2020 1031  LABSPEC 1.013 01/01/2020 1031   PHURINE 6.0 01/01/2020 1031   GLUCOSEU 50 (A) 01/01/2020 1031   HGBUR SMALL (A) 01/01/2020 1031   BILIRUBINUR MODERATE (A) 01/01/2020 1031   BILIRUBINUR large (A) 03/19/2016 1103   BILIRUBINUR small 12/25/2014 1001   KETONESUR 5 (A) 01/01/2020 1031   PROTEINUR NEGATIVE 01/01/2020 1031   UROBILINOGEN 4.0 03/19/2016 1103   UROBILINOGEN 0.2 01/21/2015 0623   NITRITE NEGATIVE 01/01/2020 1031   LEUKOCYTESUR NEGATIVE 01/01/2020 1031     Thomasine Klutts M.D. [facility] 01/06/2020, 11:12 AM   Call night coverage person covering after 7pm

## 2020-01-06 NOTE — Progress Notes (Signed)
[facility] Progress Note  CC:  alcoholic hepatitis  Subjective: Continues to have intermittent differ abdominal cramping abdominal pain without nausea/vomiting. Some brash. BM earlier today.  No new complaints today. Father who does not speak any English present at the bedside.   Objective:   Vital signs in last 24 hours: Temp:  [98 F (36.7 C)-98.5 F (36.9 C)] 98.5 F (36.9 C) (05/09 0623) Pulse Rate:  [88-103] 103 (05/09 0623) Resp:  [19-20] 20 (05/09 0623) BP: (108-122)/(65-72) 108/65 (05/09 0623) SpO2:  [95 %] 95 % (05/09 0623) Last BM Date: 01/03/20   General:   Alert 54 year old male in NAD.  Marked jaundice. Bilateral temporal wasting.  Eyes:  Moderate scleral icterus. Conjunctiva pink.  Heart: RRR, no murmur.  Pulm:  Breath sounds clear throughout.  Abdomen: Protuberant but not tense. Subtle fluid wave.  Nontender. No hepatomegaly. + Splenomegaly. Normal bowel sounds. No leakage noted from paracentesis site.  Extremities:  Bilateral lower extremity 1+ edema. Neurologic:  Alert and  oriented x3 - thinks it's 2001; + asterixis today  Psych:  Alert and cooperative. Normal mood and affect.  Paracentesis 01/02/2020: 1.4 L of clear golden yellow fluid removed.  No evidence of SBP. SAAG < 1.1  Ascitic protein > 2.5. Cytology showed reactive mesothelial cells. Urine sodium < 10. Normal BUN/Cr. Urine protein negative.   ECHO 01/02/2020: 1. Left ventricular ejection fraction, by estimation, is 70 to 75%. The left ventricle has hyperdynamic function. The left ventricle has no regional wall motion abnormalities. There is moderate left ventricular hypertrophy. Left ventricular diastolic parameters are consistent with Grade I diastolic dysfunction (impaired relaxation). 2. Right ventricular systolic function is normal. The right ventricular size is normal.  Lab Results: Recent Labs    01/04/20 0457 01/05/20 0626 01/06/20 0530  WBC 5.2 7.9 10.2  HGB 11.0*  11.5* 11.7*  HCT 31.6* 33.6* 33.5*  PLT 45* 48* 50*   BMET Recent Labs    01/04/20 0457 01/05/20 0626 01/06/20 0530  NA 136 133* 135  K 3.7 3.9 4.0  CL 105 100 101  CO2 23 24 25   GLUCOSE 118* 146* 105*  BUN 21* 26* 28*  CREATININE 1.02 1.24 1.19  CALCIUM 8.2* 7.9* 7.9*   LFT Recent Labs    01/06/20 0530  PROT 6.4*  ALBUMIN 1.8*  AST 123*  ALT 85*  ALKPHOS 130*  BILITOT 28.4*   PT/INR Recent Labs    01/05/20 0626 01/06/20 0530  LABPROT 28.1* 27.5*  INR 2.7* 2.7*    Assessment / Plan:  58. 54 year old male with acute alcoholichepatitis, chronicalcoholic cirrhosis with ascites. MELD 32. Discriminant function 101. CTAP 01/01/2020 consistent with cirrhosis with moderate ascites, splenomegaly with a spontaneous splenorenal shunt and cholelithiasis. Discriminant function 101.9-> 115. Likely due to alcohol, but autoimmune labs including IgG and SMAb are elevated.  ANA pending. Prednisolone 40 mg p.o. daily started on 5/5. Hepatic labs are slightly worse today.  INR has improved to 2.7. He has been complaining of intermittent epigastric, central, and lower abdominal pain throughout this admission. Paracentesis 5/5 consistent with portal hypertension. No SBP. Spironolactone started 5/6 with slow decline in renal function since then, creatinine is now 1.24.  PPI started 5/6. Lactulose held due to GI symptoms. Now with grade 1-2 HE on exam.  He is not a liver transplant candidate due to alcohol abuse. He intends to enter inpatient rehab after hospital discharge.  -Continue Prednisolone 40 mg daily, Day 5.  Check [place] score on  day 7 -Hold spironolactone, resume at 25 mg daily when creatinine stabilizes -Follow renal function closely  -Ondansetron 4mg  po or IV Q 6hrs PRN -Pantoprazole 40mg  po QD - Add Carafate 1g QID -2 g low-sodium diet -Eventual EGD due to heme + stools, abdominal pain and to screen for esophageal/gastric varices prior to discharge. Not urgent. Waiting for  patient to stabilize. - Add xifaxan for hepatic encephalopathy - Start Miralax to insure at least 2 BM daily.   2. Hyponatremia due to portal hypertension, Na now 135  3. Macrocytic anemia. Hemoglobin stable.  4. Coagulopathy secondary to cirrhosis. Some improvement in INR with Vitamin K.  5. 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  Platelets stable at 48 today.   6. Hepatic encephalopathy - grade 1-2 on exam today.  - Add xifaxan, Miralax     LOS: 5 days   Thornton [place]  01/06/2020, 8:31am

## 2020-01-07 DIAGNOSIS — N179 Acute kidney failure, unspecified: Secondary | ICD-10-CM

## 2020-01-07 DIAGNOSIS — D696 Thrombocytopenia, unspecified: Secondary | ICD-10-CM

## 2020-01-07 LAB — COMPREHENSIVE METABOLIC PANEL
ALT: 106 U/L — ABNORMAL HIGH (ref 0–44)
AST: 142 U/L — ABNORMAL HIGH (ref 15–41)
Albumin: 1.8 g/dL — ABNORMAL LOW (ref 3.5–5.0)
Alkaline Phosphatase: 135 U/L — ABNORMAL HIGH (ref 38–126)
Anion gap: 8 (ref 5–15)
BUN: 36 mg/dL — ABNORMAL HIGH (ref 6–20)
CO2: 24 mmol/L (ref 22–32)
Calcium: 7.8 mg/dL — ABNORMAL LOW (ref 8.9–10.3)
Chloride: 99 mmol/L (ref 98–111)
Creatinine, Ser: 1.36 mg/dL — ABNORMAL HIGH (ref 0.61–1.24)
GFR calc Af Amer: >60 mL/min (ref >60)
GFR calc non Af Amer: 58 mL/min — ABNORMAL LOW (ref >60)
Glucose, Bld: 110 mg/dL — ABNORMAL HIGH (ref 70–99)
Potassium: 4.6 mmol/L (ref 3.5–5.1)
Sodium: 131 mmol/L — ABNORMAL LOW (ref 135–145)
Total Bilirubin: 28.5 mg/dL (ref 0.3–1.2)
Total Protein: 6.3 g/dL — ABNORMAL LOW (ref 6.5–8.1)

## 2020-01-07 LAB — CBC
HCT: 34.7 % — ABNORMAL LOW (ref 39.0–52.0)
Hemoglobin: 12.1 g/dL — ABNORMAL LOW (ref 13.0–17.0)
MCH: 37.9 pg — ABNORMAL HIGH (ref 26.0–34.0)
MCHC: 34.9 g/dL (ref 30.0–36.0)
MCV: 108.8 fL — ABNORMAL HIGH (ref 80.0–100.0)
Platelets: 51 10*3/uL — ABNORMAL LOW (ref 150–400)
RBC: 3.19 MIL/uL — ABNORMAL LOW (ref 4.22–5.81)
RDW: 17.4 % — ABNORMAL HIGH (ref 11.5–15.5)
WBC: 12.4 10*3/uL — ABNORMAL HIGH (ref 4.0–10.5)
nRBC: 0 % (ref 0.0–0.2)

## 2020-01-07 LAB — CULTURE, BODY FLUID W GRAM STAIN -BOTTLE: Culture: NO GROWTH

## 2020-01-07 LAB — OSMOLALITY, URINE: Osmolality, Ur: 488 mOsm/kg (ref 300–900)

## 2020-01-07 LAB — FOLATE RBC: Folate, Hemolysate: 335 ng/mL

## 2020-01-07 LAB — SODIUM, URINE, RANDOM: Sodium, Ur: <10 mmol/L

## 2020-01-07 MED ORDER — LACTULOSE 10 GM/15ML PO SOLN
20.0000 g | Freq: Two times a day (BID) | ORAL | Status: DC
  Administered 2020-01-07 – 2020-01-11 (×8): 20 g via ORAL
  Filled 2020-01-07 (×8): qty 30

## 2020-01-07 MED ORDER — ALBUMIN HUMAN 25 % IV SOLN
25.0000 g | Freq: Four times a day (QID) | INTRAVENOUS | Status: AC
  Administered 2020-01-07 – 2020-01-08 (×4): 25 g via INTRAVENOUS
  Filled 2020-01-07 (×4): qty 100

## 2020-01-07 MED ORDER — TRAMADOL HCL 50 MG PO TABS
50.0000 mg | ORAL_TABLET | Freq: Two times a day (BID) | ORAL | Status: DC | PRN
  Filled 2020-01-07: qty 1

## 2020-01-07 NOTE — Progress Notes (Addendum)
[name], male   DOB: [birth year], 54 y.o.   MRN: [id number]    Progress Note   Subjective   Day # 6  CC; alcoholic hepatitis  Day #6  prednisolone  Status post paracentesis 01/02/1999 21-1.4 L, no evidence for SBP  M ELD 32 Discriminant function 115  Patient is alert and mentating well.  He is complaining of heartburn and increased abdominal discomfort due to increased "tightness".  He says this is really not pain but pressure Also complaining of dizziness which he says he has had for several days.  Additional review of systems: Denies chest pain No dyspnea, but has not been out of bed Abdominal bloating   Objective   Vital signs in last 24 hours: Temp:  [97.5 F (36.4 C)-98.6 F (37 C)] 97.5 F (36.4 C) (05/10 0605) Pulse Rate:  [89-96] 89 (05/10 0605) Resp:  [16] 16 (05/10 0605) BP: (103-133)/(66-79) 133/79 (05/10 0605) SpO2:  [93 %-95 %] 93 % (05/10 0605) Last BM Date: 01/06/20 General:    hispanic male in NAD deeply icteric Heart:  Regular rate and rhythm; no murmurs Lungs: Respirations even and unlabored, lungs CTA bilaterally, decreased breath sounds bilateral bases Abdomen: Protuberant with fairly tight ascites, mild diffuse tenderness normal bowel sounds. Extremities: 2+ edema bilateral ankles/shins Neurologic:  Alert and oriented,  grossly normal neurologically.  Positive asterixis Psych:  Cooperative. Normal mood and affect.  Intake/Output from previous day: 05/09 0701 - 05/10 0700 In: 720 [P.O.:720] Out: -  Intake/Output this shift: No intake/output data recorded.  Lab Results: Recent Labs    01/05/20 0626 01/06/20 0530 01/07/20 0424  WBC 7.9 10.2 12.4*  HGB 11.5* 11.7* 12.1*  HCT 33.6* 33.5* 34.7*  PLT 48* 50* 51*   BMET Recent Labs    01/05/20 0626 01/06/20 0530 01/07/20 0424  NA 133* 135 131*  K 3.9 4.0 4.6  CL 100 101 99  CO2 24 25 24   GLUCOSE 146* 105* 110*  BUN 26* 28* 36*  CREATININE 1.24 1.19 1.36*  CALCIUM  7.9* 7.9* 7.8*   LFT Recent Labs    01/07/20 0424  PROT 6.3*  ALBUMIN 1.8*  AST 142*  ALT 106*  ALKPHOS 135*  BILITOT 28.5*   PT/INR Recent Labs    01/05/20 0626 01/06/20 0530  LABPROT 28.1* 27.5*  INR 2.7* 2.7*    Studies/Results: No results found.  Urine sodium less than 10    Assessment / Plan:    #68 54 year old [name] with decompensated alcoholic liver disease with severe alcoholic hepatitis.  Patient has severe coagulopathy, mild hepatic encephalopathy, severe thrombocytopenia.  Day #6 prednisolone M ELD 33  DF 85.2  #2 bump in creatinine over the past 24 hours-no diuretics Will check urine sodium and osmolality Question early HRS   Plan; Continue current regimen, prednisolone 40 mg p.o. daily and reassess tomorrow at day 7  Urine sodium and osmolality, watch renal function carefully  Would like to do paracentesis, for comfort as he is more distended and tight, however hesitant today until we see how his renal function trends    Active Problems:   Cirrhosis ([place])   Alcoholic hepatitis with ascites   Coagulopathy ([place])   Portal hypertension ([place])   Encephalopathy, hepatic ([place])     LOS: 6 days   [name] PA-C 01/07/2020, 10:20 AM   I have discussed the case with the PA, and that is the plan I formulated. I personally interviewed and examined the patient.  Severe acute  alcoholic hepatitis with high discriminant function, he is not making any clinical improvement after 6 days of prednisolone.  Mild increase in creatinine from a few days ago, prerenal with decreased effective circulating volume and sodium avidity. He is mildly encephalopathic on exam with asterixis and some word finding difficulty as well as a blunted affect.  He has large volume ascites and is becoming uncomfortable from that.  I am concerned that in the near future it might affect his respiratory status, especially since he already shows signs of  encephalopathy.  Either diuretics or paracentesis would present a risk to his renal function, but we must attend to his volume overload as best we can.  Paracentesis also presents bleeding risk given his thrombocytopenia and coagulopathy. He is unfortunately not a transplant candidate  I have ordered the following: Discontinue Carafate Discontinue MiraLAX Start lactulose 20 g twice daily Therapeutic paracentesis tomorrow, maximum 3 L removed Albumin 25 g IV every 6 hours x4  Total time 35 minutes, complex patient ,multiple interventions  Nelida Meuse III Office: [phone]

## 2020-01-07 NOTE — TOC Initial Note (Signed)
Transition of Care [facility]) - Initial/Assessment Note    Patient Details  Name: Jesse Esparza MRN: [id number] Date of Birth: Feb 05, 1966  [facility] CM/SW Contact:    Lynnell Catalan, RN Phone Number: 01/07/2020, 3:27 PM  Clinical Narrative:                 This cm spoke with pt at bedside for DC planning. Pt plans to dc to [facility] drug rehab facility. This CM had pt sign release of medical information to provide [facility] with pt medical records. Pt referral faxed to June at [facility] (fax: [phone]), (phone: [phone]) June states that they will have a bed available on Wednesday 5/12. TOC will continue to follow and assist with transfer to [facility].  Expected Discharge Plan: IP Rehab Facility([facility] drug rehab) Barriers to Discharge: Continued Medical Work up  Expected Discharge Plan and Services Expected Discharge Plan: IP Rehab Facility([facility] drug rehab) In-house Referral: Clinical Social Work Discharge Planning Services: Medication Assistance     Prior Living Arrangements/Services   Lives with:: Self Patient language and need for interpreter reviewed:: Yes        Need for Family Participation in Patient Care: Yes (Comment) Care giver support system in place?: Yes (comment)      Activities of Daily Living Home Assistive Devices/Equipment: None ADL Screening (condition at time of admission) Patient's cognitive ability adequate to safely complete daily activities?: No Is the patient deaf or have difficulty hearing?: No Does the patient have difficulty seeing, even when wearing glasses/contacts?: No Does the patient have difficulty concentrating, remembering, or making decisions?: Yes Patient able to express need for assistance with ADLs?: Yes Does the patient have difficulty dressing or bathing?: Yes Independently performs ADLs?: No Communication: Independent Dressing (OT): Needs assistance Is this a change from baseline?: Change from baseline,  expected to last >3 days Grooming: Independent Feeding: Independent Bathing: Needs assistance Is this a change from baseline?: Change from baseline, expected to last >3 days Toileting: Needs assistance Is this a change from baseline?: Change from baseline, expected to last >3days In/Out Bed: Needs assistance Is this a change from baseline?: Change from baseline, expected to last >3 days Walks in Home: Needs assistance Is this a change from baseline?: Change from baseline, expected to last >3 days Does the patient have difficulty walking or climbing stairs?: Yes Weakness of Legs: Both Weakness of Arms/Hands: Both  Permission Sought/Granted         Permission granted to share info w AGENCY: [facility]        Emotional Assessment Appearance:: Appears stated age Attitude/Demeanor/Rapport: Gracious Affect (typically observed): Calm Orientation: : Oriented to Self, Oriented to Place, Oriented to Situation, Oriented to  Time Alcohol / Substance Use: Alcohol Use    Admission diagnosis:  Hyperbilirubinemia [E80.6] Cirrhosis ([place]) [K74.60] Cirrhosis of liver ([place]) [K74.60] Ascites [R18.8] Patient Active Problem List   Diagnosis Date Noted  . Encephalopathy, hepatic ([place])   . Alcoholic hepatitis with ascites   . Coagulopathy ([place])   . Portal hypertension ([place])   . Cirrhosis ([place]) 01/01/2020  . Alcoholic cirrhosis of liver with ascites ([place])   . Spinal stenosis of lumbar region 05/04/2016  . Adrenal nodule ([place]) 04/16/2016  . Hyperbilirubinemia 03/31/2016  . GERD (gastroesophageal reflux disease) 03/19/2016  . Essential hypertension, benign 03/13/2014  . Elevated BP 12/14/2013  . Obesity, unspecified 12/14/2013   PCP:  Charlott Rakes, MD Pharmacy:   [id number] - [place], [place]  [address] [address] Phone: [phone] Fax: [phone]     Social Determinants of Health (SDOH) Interventions    Readmission Risk  Interventions No flowsheet data found.

## 2020-01-07 NOTE — Progress Notes (Signed)
PROGRESS NOTE    Jesse Esparza  [id number]  DOB: [birth year]  PCP: Charlott Rakes, MD Admit date:01/01/2020 54 year old male with history of known cirrhosis, GERD, hyperlipidemia, hypertension, obesity presented with 3 weeks of worsening fatigue, nausea, decreased appetite. Also reported lower extremity edema, worsening abdominal distention and jaundice. Patient reported consuming 4 to 6 cans of beers per day for years and stopped 2 weeks ago.  ED Course: Labs with transaminitis, elevated bili, hypoalbuminemia, hypercalcemia, elevated INR, elevated ammonia, macrocytic anemia.CT showed cirrhotic liver with splenomegaly and spontaneous splenorenal shunt, moderate ascites with cholelithiasis. Hospital course: Patient admitted to [facility] for further evaluation and management of acute alcoholic hepatitis with GI consultation. Patient underwent paracentesis on 5/5 with drainage of 1.4 lits yellow ascitic fluid. Echo showed GRADE 1 diastolic dysfunction with EF 70-75%.   Subjective:  Patient complaining of mid to lower abdominal pain, 6/10, at times radiating to chest and describes as burning sensation.  Appears to be awake alert oriented x3 today.  Father at bedside.  Objective: Vitals:   01/06/20 0623 01/06/20 1352 01/06/20 2007 01/07/20 0605  BP: 108/65 104/66 103/71 133/79  Pulse: (!) 103 96 94 89  Resp: 20 16 16 16   Temp: 98.5 F (36.9 C) 98.6 F (37 C) 98.1 F (36.7 C) (!) 97.5 F (36.4 C)  TempSrc: Oral Oral Oral Oral  SpO2: 95% 95% 95% 93%  Weight:      Height:        Intake/Output Summary (Last 24 hours) at 01/07/2020 0944 Last data filed at 01/06/2020 1630 Gross per 24 hour  Intake 480 ml  Output -  Net 480 ml   Filed Weights   01/01/20 1737  Weight: 115.3 kg    Physical Examination:  General exam: Appears jaundiced, in mild distress due to abdominal pain.   Respiratory system: Clear to auscultation. Respiratory effort normal. Cardiovascular system: S1 & S2  heard, RRR. No JVD, murmurs, rubs, gallops or clicks. No pedal edema. Gastrointestinal system: Abdomen is moderately distended with ascites, mild diffuse tenderness. Normal bowel sounds heard. Central nervous system: Alert and oriented. No new focal neurological deficits. Extremities: No contractures, edema or joint deformities.  Skin: No rashes, lesions or ulcers Psychiatry: Judgement and insight appear normal. Mood & affect appropriate.   Data Reviewed: I have personally reviewed following labs and imaging studies  CBC: Recent Labs  Lab 01/01/20 1018 01/01/20 1018 01/02/20 0524 01/02/20 0524 01/03/20 0519 01/04/20 0457 01/05/20 0626 01/06/20 0530 01/07/20 0424  WBC 6.8   < > 7.1   < > 6.4 5.2 7.9 10.2 12.4*  NEUTROABS 5.4  --  5.5  --   --   --   --   --   --   HGB 12.3*   < > 12.0*   < > 11.4* 11.0* 11.5* 11.7* 12.1*  HCT 36.1*   < > 34.1*  NOT PERFORMED   < > 33.3* 31.6* 33.6* 33.5* 34.7*  MCV 108.1*   < > 107.6*   < > 108.1* 111.3* 108.7* 108.1* 108.8*  PLT 51*   < > 54*   < > 47* 45* 48* 50* 51*   < > = values in this interval not displayed.   Basic Metabolic Panel: Recent Labs  Lab 01/03/20 0519 01/04/20 0457 01/05/20 0626 01/06/20 0530 01/07/20 0424  NA 136 136 133* 135 131*  K 4.1 3.7 3.9 4.0 4.6  CL 102 105 100 101 99  CO2 25 23 24 25 24   GLUCOSE 111* 118* 146*  105* 110*  BUN 17 21* 26* 28* 36*  CREATININE 0.76 1.02 1.24 1.19 1.36*  CALCIUM 8.5* 8.2* 7.9* 7.9* 7.8*   GFR: Estimated Creatinine Clearance: 72 mL/min (A) (by C-G formula based on SCr of 1.36 mg/dL (H)). Liver Function Tests: Recent Labs  Lab 01/03/20 0519 01/04/20 0457 01/05/20 0626 01/06/20 0530 01/07/20 0424  AST 155* 123* 118* 123* 142*  ALT 73* 71* 79* 85* 106*  ALKPHOS 127* 122 140* 130* 135*  BILITOT 29.3* 26.0* 28.9* 28.4* 28.5*  PROT 6.6 6.1* 6.4* 6.4* 6.3*  ALBUMIN 2.0* 1.8* 1.8* 1.8* 1.8*   No results for input(s): LIPASE, AMYLASE in the last 168 hours. Recent Labs  Lab  01/01/20 1018 01/04/20 0458 01/05/20 0626 01/06/20 0530  AMMONIA 48* 107* 122* 99*   Coagulation Profile: Recent Labs  Lab 01/02/20 0524 01/03/20 0519 01/04/20 0457 01/05/20 0626 01/06/20 0530  INR 3.3* 3.2* 3.0* 2.7* 2.7*   Cardiac Enzymes: No results for input(s): CKTOTAL, CKMB, CKMBINDEX, TROPONINI in the last 168 hours. BNP (last 3 results) No results for input(s): PROBNP in the last 8760 hours. HbA1C: No results for input(s): HGBA1C in the last 72 hours. CBG: No results for input(s): GLUCAP in the last 168 hours. Lipid Profile: No results for input(s): CHOL, HDL, LDLCALC, TRIG, CHOLHDL, LDLDIRECT in the last 72 hours. Thyroid Function Tests: No results for input(s): TSH, T4TOTAL, FREET4, T3FREE, THYROIDAB in the last 72 hours. Anemia Panel: No results for input(s): VITAMINB12, FOLATE, FERRITIN, TIBC, IRON, RETICCTPCT in the last 72 hours. Sepsis Labs: No results for input(s): PROCALCITON, LATICACIDVEN in the last 168 hours.  Recent Results (from the past 240 hour(s))  Respiratory Panel by RT PCR (Flu A&B, Covid) - Nasopharyngeal Swab     Status: None   Collection Time: 01/01/20  2:07 PM   Specimen: Nasopharyngeal Swab  Result Value Ref Range Status   SARS Coronavirus 2 by RT PCR NEGATIVE NEGATIVE Final    Comment: (NOTE) SARS-CoV-2 target nucleic acids are NOT DETECTED. The SARS-CoV-2 RNA is generally detectable in upper respiratoy specimens during the acute phase of infection. The lowest concentration of SARS-CoV-2 viral copies this assay can detect is 131 copies/mL. A negative result does not preclude SARS-Cov-2 infection and should not be used as the sole basis for treatment or other patient management decisions. A negative result may occur with  improper specimen collection/handling, submission of specimen other than nasopharyngeal swab, presence of viral mutation(s) within the areas targeted by this assay, and inadequate number of viral copies (<131  copies/mL). A negative result must be combined with clinical observations, patient history, and epidemiological information. The expected result is Negative. Fact Sheet for Patients:  PinkCheek.be Fact Sheet for Healthcare Providers:  GravelBags.it This test is not yet ap proved or cleared by the [place] FDA and  has been authorized for detection and/or diagnosis of SARS-CoV-2 by FDA under an Emergency Use Authorization (EUA). This EUA will remain  in effect (meaning this test can be used) for the duration of the COVID-19 declaration under Section 564(b)(1) of the Act, 21 U.S.C. section 360bbb-3(b)(1), unless the authorization is terminated or revoked sooner.    Influenza A by PCR NEGATIVE NEGATIVE Final   Influenza B by PCR NEGATIVE NEGATIVE Final    Comment: (NOTE) The Xpert Xpress SARS-CoV-2/FLU/RSV assay is intended as an aid in  the diagnosis of influenza from Nasopharyngeal swab specimens and  should not be used as a sole basis for treatment. Nasal washings and  aspirates are unacceptable  for Xpert Xpress SARS-CoV-2/FLU/RSV  testing. Fact Sheet for Patients: PinkCheek.be Fact Sheet for Healthcare Providers: GravelBags.it This test is not yet approved or cleared by the [place] FDA and  has been authorized for detection and/or diagnosis of SARS-CoV-2 by  FDA under an Emergency Use Authorization (EUA). This EUA will remain  in effect (meaning this test can be used) for the duration of the  Covid-19 declaration under Section 564(b)(1) of the Act, 21  U.S.C. section 360bbb-3(b)(1), unless the authorization is  terminated or revoked. Performed at [facility], [place] [address] [place], [place] [address]   Culture, body fluid-bottle     Status: None   Collection Time: 01/02/20 12:36 PM   Specimen: Fluid  Result Value Ref Range Status    Specimen Description FLUID PERITONEAL  Final   Special Requests BOTTLES DRAWN AEROBIC AND ANAEROBIC 10CC  Final   Culture   Final    NO GROWTH 5 DAYS Performed at [facility], [place] [address] [place], [place] [address]    Report Status 01/07/2020 FINAL  Final  Gram stain     Status: None   Collection Time: 01/02/20 12:36 PM   Specimen: Fluid  Result Value Ref Range Status   Specimen Description FLUID PERITONEAL  Final   Special Requests NONE  Final   Gram Stain   Final    FEW WBC PRESENT, PREDOMINANTLY MONONUCLEAR NO ORGANISMS SEEN Performed at [facility], [place] [address], [place], [place] [address]    Report Status 01/02/2020 FINAL  Final      Radiology Studies: No results found.      Scheduled Meds: . folic acid  1 mg Oral Daily  . multivitamin with minerals  1 tablet Oral Daily  . pantoprazole  40 mg Oral Daily  . polyethylene glycol  17 g Oral Daily  . prednisoLONE  40 mg Oral Daily  . rifaximin  550 mg Oral BID  . sucralfate  1 g Oral TID WC & HS  . thiamine  100 mg Oral Daily   Continuous Infusions:   Assessment/Plan:  1.Acute alcoholic hepatitis: Seen and being followed by GI. MELD score is 32, Discriminant function 101.9->115,started on prednisolone 40 mg on 5/5--day 6 today. Lab work up being followed daily--transaminases, bili and INR. Bili still up at 28. Autoimmune labs including IgG and SMAb are elevated. ANA pending. Prednisolone40 mg p.o. daily started on 5/5.  2. Liver cirrhosis with ascitis/Hepatic encephalopathy: Been c/o abdominal pain throughout admission. Paracentesis 5/5 consistent with portal hypertension per GI. No SBP. Status post albumin 25 g x 1, albumin level 1.8. Spironolactone started 5/6 with slow decline in renal function, creatinine now 1.24-held as patient also c/o dizziness. Ammonia level 99. Lactulose held due to c/o abdominal pain but then confused- GI added Miralax/Xifaxan/Carafate on 5/9. Already on PPI .   Patient today awake, alert but complaining of abdominal discomfort.  Reluctant to add acetaminophen/NSAIDs given liver and kidney issues.  Will have tramadol available.  Avoid strong opiates as might confuse mental status picture.  May need repeat paracentesis per GI. He is not a liver transplant candidate due to ongoing alcohol abuse. SA center on d/c.   3. Coagulopathy secondary to cirrhosis. Some improvement in INR with Vitamin K 10 mg x 3 days.  4. Epistaxis in the setting of thrombocytopenia secondary to cirrhosis with splenomegaly. Platelets stable at 48 today.  5. Intermittent UGIB : Patient reported history of black solid stool and intermittent  bright red blood over the past 2 months. FOBT positive.Will need EGD per GI prior to d/c for variceal screening and possibly OP colonoscopy.  6. Acute kidney injury: Patient's baseline creatinine around 0.7 last week.  Has slowly up trended and up to 1.36 today.  Urine electrolytes ordered by GI to rule out hepatorenal syndrome.  Avoid NSAIDs/nephrotoxic agents.  Not on any diuretics, reluctant to add fluids with problem #2.  7. Hypokalemia/Hyponatremia: resolved  8. Obesity-Estimated body mass index is 42.28 kg/m  DVT prophylaxis: SCDs  Code Status: Full code Family / Patient Communication: Sister been in communication with primary team.   Discussed with patient and father at bedside today. Disposition Plan:   Status is: Inpatient  Remains inpatient appropriate because:Altered mental status, need for daily labs/close monitoring, severity of illness and GI clearance   Dispo: The patient is from: Home  Anticipated d/c is to: Home  Anticipated d/c date is: 3 days  Patient currently is not medically stable to d/c.    LOS: 6 days    Time spent: 35 minutes    Guilford Shi, MD [facility] Pager in [place]  If 7PM-7AM, please contact night-coverage [email or web address] 01/07/2020, 9:44  AM

## 2020-01-08 ENCOUNTER — Inpatient Hospital Stay (HOSPITAL_COMMUNITY): Payer: Self-pay

## 2020-01-08 DIAGNOSIS — R188 Other ascites: Secondary | ICD-10-CM

## 2020-01-08 LAB — COMPREHENSIVE METABOLIC PANEL
ALT: 112 U/L — ABNORMAL HIGH (ref 0–44)
AST: 141 U/L — ABNORMAL HIGH (ref 15–41)
Albumin: 2.6 g/dL — ABNORMAL LOW (ref 3.5–5.0)
Alkaline Phosphatase: 118 U/L (ref 38–126)
Anion gap: 9 (ref 5–15)
BUN: 40 mg/dL — ABNORMAL HIGH (ref 6–20)
CO2: 24 mmol/L (ref 22–32)
Calcium: 8.5 mg/dL — ABNORMAL LOW (ref 8.9–10.3)
Chloride: 102 mmol/L (ref 98–111)
Creatinine, Ser: 1.17 mg/dL (ref 0.61–1.24)
GFR calc Af Amer: >60 mL/min (ref >60)
GFR calc non Af Amer: >60 mL/min (ref >60)
Glucose, Bld: 106 mg/dL — ABNORMAL HIGH (ref 70–99)
Potassium: 4.2 mmol/L (ref 3.5–5.1)
Sodium: 135 mmol/L (ref 135–145)
Total Bilirubin: 28.6 mg/dL (ref 0.3–1.2)
Total Protein: 6.8 g/dL (ref 6.5–8.1)

## 2020-01-08 LAB — CBC
HCT: 30.9 % — ABNORMAL LOW (ref 39.0–52.0)
Hemoglobin: 10.8 g/dL — ABNORMAL LOW (ref 13.0–17.0)
MCH: 37.8 pg — ABNORMAL HIGH (ref 26.0–34.0)
MCHC: 35 g/dL (ref 30.0–36.0)
MCV: 108 fL — ABNORMAL HIGH (ref 80.0–100.0)
Platelets: 41 10*3/uL — ABNORMAL LOW (ref 150–400)
RBC: 2.86 MIL/uL — ABNORMAL LOW (ref 4.22–5.81)
RDW: 17.3 % — ABNORMAL HIGH (ref 11.5–15.5)
WBC: 11 10*3/uL — ABNORMAL HIGH (ref 4.0–10.5)
nRBC: 0 % (ref 0.0–0.2)

## 2020-01-08 LAB — PROTIME-INR
INR: 2.8 — ABNORMAL HIGH (ref 0.8–1.2)
Prothrombin Time: 28.4 seconds — ABNORMAL HIGH (ref 11.4–15.2)

## 2020-01-08 LAB — ANA

## 2020-01-08 MED ORDER — PANTOPRAZOLE SODIUM 40 MG PO TBEC
40.0000 mg | DELAYED_RELEASE_TABLET | Freq: Two times a day (BID) | ORAL | Status: DC
  Administered 2020-01-08 – 2020-01-11 (×6): 40 mg via ORAL
  Filled 2020-01-08 (×6): qty 1

## 2020-01-08 MED ORDER — ENSURE ENLIVE PO LIQD
237.0000 mL | Freq: Two times a day (BID) | ORAL | Status: DC
  Administered 2020-01-09 – 2020-01-10 (×3): 237 mL via ORAL

## 2020-01-08 MED ORDER — ONDANSETRON HCL 4 MG/2ML IJ SOLN
4.0000 mg | Freq: Four times a day (QID) | INTRAMUSCULAR | Status: DC | PRN
  Administered 2020-01-08: 4 mg via INTRAVENOUS
  Filled 2020-01-08: qty 2

## 2020-01-08 MED ORDER — VITAMIN K1 10 MG/ML IJ SOLN
1.0000 mg | Freq: Every day | INTRAVENOUS | Status: AC
  Administered 2020-01-08 – 2020-01-10 (×3): 1 mg via INTRAVENOUS
  Filled 2020-01-08 (×3): qty 0.1

## 2020-01-08 MED ORDER — LIDOCAINE HCL 1 % IJ SOLN
INTRAMUSCULAR | Status: AC
  Filled 2020-01-08: qty 20

## 2020-01-08 MED ORDER — SPIRONOLACTONE 25 MG PO TABS
50.0000 mg | ORAL_TABLET | Freq: Every day | ORAL | Status: DC
  Administered 2020-01-08: 50 mg via ORAL
  Filled 2020-01-08 (×2): qty 2

## 2020-01-08 MED ORDER — FUROSEMIDE 40 MG PO TABS
40.0000 mg | ORAL_TABLET | Freq: Every day | ORAL | Status: DC
  Administered 2020-01-08 – 2020-01-11 (×4): 40 mg via ORAL
  Filled 2020-01-08 (×4): qty 1

## 2020-01-08 NOTE — TOC Transition Note (Signed)
Transition of Care [facility]) - CM/SW Discharge Note   Patient Details  Name: Jesse Esparza MRN: [id number] Date of Birth: 01/05/1966  Transition of [facility]) CM/SW Contact:  Maximilian Tallo, Marjie Skiff, RN Phone Number: 01/08/2020, 12:07 PM   Clinical Narrative:    Per [facility] liaison they are unable to take pt at this time because they are unable to handle his medical needs. This CM informed pt of information. This CM was then given permission by pt to call in a referral for intensive outpatient treatment. This CM left message at [facility] [phone], and am awaiting return call.      Barriers to Discharge: Continued Medical Work up   Discharge Plan and Services In-house Referral: Clinical Social Work Discharge Planning Services: Medication Assistance

## 2020-01-08 NOTE — Progress Notes (Signed)
PROGRESS NOTE    Jesse Esparza  [id number]  DOB: [birth year]  PCP: Charlott Rakes, MD Admit date:01/01/2020 54 year old male with history of known cirrhosis, GERD, hyperlipidemia, hypertension, obesity presented with 3 weeks of worsening fatigue, nausea, decreased appetite. Also reported lower extremity edema, worsening abdominal distention and jaundice. Patient reported consuming 4 to 6 cans of beers per day for years and stopped 2 weeks ago.  ED Course: Labs with transaminitis, elevated bili, hypoalbuminemia, hypercalcemia, elevated INR, elevated ammonia, macrocytic anemia.CT showed cirrhotic liver with splenomegaly and spontaneous splenorenal shunt, moderate ascites with cholelithiasis. Hospital course: Patient admitted to [facility] for further evaluation and management of acute alcoholic hepatitis with GI consultation. Patient underwent paracentesis on 5/5 with drainage of 1.4 lits yellow ascitic fluid.Echo showed GRADE 1 diastolic dysfunction with EF 70-75%.   Subjective:  Patient out of bed to chair.  Complaining of worsening abdominal discomfort due to distention today.  Appears to be awake alert oriented x3 today.  Father at bedside.  Objective: Vitals:   01/08/20 0522 01/08/20 1122 01/08/20 1529 01/08/20 1544  BP: 123/73 127/72 125/70 129/74  Pulse: 88 84 85 87  Resp: 18     Temp: 98.2 F (36.8 C)     TempSrc: Oral     SpO2: 94% 96%    Weight:      Height:        Intake/Output Summary (Last 24 hours) at 01/08/2020 1639 Last data filed at 01/08/2020 1311 Gross per 24 hour  Intake 960 ml  Output --  Net 960 ml   Filed Weights   01/01/20 1737  Weight: 115.3 kg    Physical Examination:  General exam: Appears jaundiced, in mild distress due to abdominal pain.   Respiratory system: Clear to auscultation. Respiratory effort normal. Cardiovascular system: S1 & S2 heard, RRR. No JVD, murmurs, rubs, gallops or clicks. No pedal edema. Gastrointestinal system: Abdomen is  distended with ascites, mild diffuse tenderness. Normal bowel sounds heard. Central nervous system: Alert and oriented. No new focal neurological deficits. Extremities: No contractures, edema or joint deformities.  Skin: No rashes, lesions or ulcers Psychiatry: Judgement and insight appear normal. Mood & affect appropriate.   Data Reviewed: I have personally reviewed following labs and imaging studies  CBC: Recent Labs  Lab 01/02/20 0524 01/03/20 0519 01/04/20 0457 01/05/20 0626 01/06/20 0530 01/07/20 0424 01/08/20 0545  WBC 7.1   < > 5.2 7.9 10.2 12.4* 11.0*  NEUTROABS 5.5  --   --   --   --   --   --   HGB 12.0*   < > 11.0* 11.5* 11.7* 12.1* 10.8*  HCT 34.1*  NOT PERFORMED   < > 31.6* 33.6* 33.5* 34.7* 30.9*  MCV 107.6*   < > 111.3* 108.7* 108.1* 108.8* 108.0*  PLT 54*   < > 45* 48* 50* 51* 41*   < > = values in this interval not displayed.   Basic Metabolic Panel: Recent Labs  Lab 01/04/20 0457 01/05/20 0626 01/06/20 0530 01/07/20 0424 01/08/20 0545  NA 136 133* 135 131* 135  K 3.7 3.9 4.0 4.6 4.2  CL 105 100 101 99 102  CO2 23 24 25 24 24   GLUCOSE 118* 146* 105* 110* 106*  BUN 21* 26* 28* 36* 40*  CREATININE 1.02 1.24 1.19 1.36* 1.17  CALCIUM 8.2* 7.9* 7.9* 7.8* 8.5*   GFR: Estimated Creatinine Clearance: 83.7 mL/min (by C-G formula based on SCr of 1.17 mg/dL). Liver Function Tests: Recent Labs  Lab 01/04/20 0457  01/05/20 0626 01/06/20 0530 01/07/20 0424 01/08/20 0545  AST 123* 118* 123* 142* 141*  ALT 71* 79* 85* 106* 112*  ALKPHOS 122 140* 130* 135* 118  BILITOT 26.0* 28.9* 28.4* 28.5* 28.6*  PROT 6.1* 6.4* 6.4* 6.3* 6.8  ALBUMIN 1.8* 1.8* 1.8* 1.8* 2.6*   No results for input(s): LIPASE, AMYLASE in the last 168 hours. Recent Labs  Lab 01/04/20 0458 01/05/20 0626 01/06/20 0530  AMMONIA 107* 122* 99*   Coagulation Profile: Recent Labs  Lab 01/03/20 0519 01/04/20 0457 01/05/20 0626 01/06/20 0530 01/08/20 0917  INR 3.2* 3.0* 2.7* 2.7*  2.8*   Cardiac Enzymes: No results for input(s): CKTOTAL, CKMB, CKMBINDEX, TROPONINI in the last 168 hours. BNP (last 3 results) No results for input(s): PROBNP in the last 8760 hours. HbA1C: No results for input(s): HGBA1C in the last 72 hours. CBG: No results for input(s): GLUCAP in the last 168 hours. Lipid Profile: No results for input(s): CHOL, HDL, LDLCALC, TRIG, CHOLHDL, LDLDIRECT in the last 72 hours. Thyroid Function Tests: No results for input(s): TSH, T4TOTAL, FREET4, T3FREE, THYROIDAB in the last 72 hours. Anemia Panel: No results for input(s): VITAMINB12, FOLATE, FERRITIN, TIBC, IRON, RETICCTPCT in the last 72 hours. Sepsis Labs: No results for input(s): PROCALCITON, LATICACIDVEN in the last 168 hours.  Recent Results (from the past 240 hour(s))  Respiratory Panel by RT PCR (Flu A&B, Covid) - Nasopharyngeal Swab     Status: None   Collection Time: 01/01/20  2:07 PM   Specimen: Nasopharyngeal Swab  Result Value Ref Range Status   SARS Coronavirus 2 by RT PCR NEGATIVE NEGATIVE Final    Comment: (NOTE) SARS-CoV-2 target nucleic acids are NOT DETECTED. The SARS-CoV-2 RNA is generally detectable in upper respiratoy specimens during the acute phase of infection. The lowest concentration of SARS-CoV-2 viral copies this assay can detect is 131 copies/mL. A negative result does not preclude SARS-Cov-2 infection and should not be used as the sole basis for treatment or other patient management decisions. A negative result may occur with  improper specimen collection/handling, submission of specimen other than nasopharyngeal swab, presence of viral mutation(s) within the areas targeted by this assay, and inadequate number of viral copies (<131 copies/mL). A negative result must be combined with clinical observations, patient history, and epidemiological information. The expected result is Negative. Fact Sheet for Patients:  PinkCheek.be Fact  Sheet for Healthcare Providers:  GravelBags.it This test is not yet ap proved or cleared by the [place] FDA and  has been authorized for detection and/or diagnosis of SARS-CoV-2 by FDA under an Emergency Use Authorization (EUA). This EUA will remain  in effect (meaning this test can be used) for the duration of the COVID-19 declaration under Section 564(b)(1) of the Act, 21 U.S.C. section 360bbb-3(b)(1), unless the authorization is terminated or revoked sooner.    Influenza A by PCR NEGATIVE NEGATIVE Final   Influenza B by PCR NEGATIVE NEGATIVE Final    Comment: (NOTE) The Xpert Xpress SARS-CoV-2/FLU/RSV assay is intended as an aid in  the diagnosis of influenza from Nasopharyngeal swab specimens and  should not be used as a sole basis for treatment. Nasal washings and  aspirates are unacceptable for Xpert Xpress SARS-CoV-2/FLU/RSV  testing. Fact Sheet for Patients: PinkCheek.be Fact Sheet for Healthcare Providers: GravelBags.it This test is not yet approved or cleared by the [place] FDA and  has been authorized for detection and/or diagnosis of SARS-CoV-2 by  FDA under an Emergency Use Authorization (EUA). This EUA will remain  in effect (meaning this test can be used) for the duration of the  Covid-19 declaration under Section 564(b)(1) of the Act, 21  U.S.C. section 360bbb-3(b)(1), unless the authorization is  terminated or revoked. Performed at [facility], [place] [address] [place], [place] [address]   Culture, body fluid-bottle     Status: None   Collection Time: 01/02/20 12:36 PM   Specimen: Fluid  Result Value Ref Range Status   Specimen Description FLUID PERITONEAL  Final   Special Requests BOTTLES DRAWN AEROBIC AND ANAEROBIC 10CC  Final   Culture   Final    NO GROWTH 5 DAYS Performed at [facility], [place] [address], [place], [place] [address]     Report Status 01/07/2020 FINAL  Final  Gram stain     Status: None   Collection Time: 01/02/20 12:36 PM   Specimen: Fluid  Result Value Ref Range Status   Specimen Description FLUID PERITONEAL  Final   Special Requests NONE  Final   Gram Stain   Final    FEW WBC PRESENT, PREDOMINANTLY MONONUCLEAR NO ORGANISMS SEEN Performed at [facility], [place] [address], [place], [place] [address]    Report Status 01/02/2020 FINAL  Final      Radiology Studies: No results found.      Scheduled Meds: . folic acid  1 mg Oral Daily  . furosemide  40 mg Oral Daily  . lactulose  20 g Oral BID  . lidocaine      . multivitamin with minerals  1 tablet Oral Daily  . pantoprazole  40 mg Oral BID AC  . prednisoLONE  40 mg Oral Daily  . rifaximin  550 mg Oral BID  . spironolactone  50 mg Oral Daily  . thiamine  100 mg Oral Daily   Continuous Infusions: . albumin human 25 g (01/08/20 0743)  . phytonadione (VITAMIN K) IV 1 mg (01/08/20 1410)     Assessment/Plan:  1.Acute alcoholic hepatitis: Seen and being followed by GI. MELD score is 32, Discriminant function 101.9->115,started on prednisolone 40 mg on 5/5--day 6 today. Lab work up being followed daily--transaminases, bili and INR. Bili still up at 28. Autoimmune labs including IgG and SMAb are elevated. ANA pending. Prednisolone40 mg p.o. daily started on 5/5.  2. Liver cirrhosis with ascitis/Hepatic encephalopathy: Been c/o abdominal pain throughout admission. Paracentesis 5/5 consistent with portal hypertension per GI. No SBP. Status post albumin 25 g x 1, albumin level 1.8. Spironolactone started 5/6 with slow decline in renal function, creatinine now 1.24-held as patient also c/o dizziness. Ammonia level 99. Lactulose held due to c/o abdominal pain but then confused- GI added Miralax/Xifaxan/Carafate on 5/9. Already on PPI .  Patient today awake, alert but complaining of abdominal discomfort.  Reluctant to add acetaminophen/NSAIDs  given liver and kidney issues.  Will have tramadol available. Plan for repeat paracentesis per GI.  Remains on IV albumin.  Diuretics resumed today as renal function better. He is not a liver transplant candidate due to ongoing alcohol abuse. SA center on d/c.   3. Coagulopathy secondary to cirrhosis. Some improvement in INR with Vitamin K 10 mg x 3 days.  4. Epistaxis in the setting of thrombocytopenia secondary to cirrhosis with splenomegaly. Platelets fluctuating 40-50.  Had mild self-limiting epistaxis today as well.  5. Intermittent UGIB : Patient reported history of black solid stool and intermittent bright red blood over the past 2 months. FOBT positive.Will need EGD per GI prior to  d/c for variceal screening and possibly OP colonoscopy.  6. Acute kidney injury: Patient's baseline creatinine around 0.7 last week.  Has slowly up trended and up to 1.36 yesterday, improved today with IV albumin.  Urine sodium less than 10.  Avoid NSAIDs/nephrotoxic agents. Reluctant to add fluids with problem #2.  Diuretics resume today by GI, watch renal function closely  7. Hypokalemia/Hyponatremia: resolved  8. Obesity-Estimated body mass index is 42.28 kg/m  DVT prophylaxis: SCDs  Code Status: Full code Family / Patient Communication: Sister been in communication with primary team.   Discussed with patient and father at bedside today. Disposition Plan:   Status is: Inpatient  Remains inpatient appropriate because:Altered mental status, need for daily labs/close monitoring, worsening ascites requiring tap, severity of illness and GI clearance   Dispo: The patient is from: Home  Anticipated d/c is to:  Complex care facility (denied for rehab per CM)  Anticipated d/c date is: 3 days  Patient currently is not medically stable to d/c.    LOS: 7 days    Time spent: 35 minutes    Guilford Shi, MD [facility] Pager in [place]  If  7PM-7AM, please contact night-coverage [email or web address] 01/08/2020, 4:39 PM

## 2020-01-08 NOTE — Progress Notes (Addendum)
[name], male   DOB: 06/07/1966, 54 y.o.   MRN: [id number]    Progress Note   Subjective   Day # 7  CC ; ETOH hepatitis, decompensated cirrhosis   Waiting for paracentesis, he is complaining of burning type heartburn discomfort in his chest and burning abdominal pain, increased discomfort, no real shortness of breath.  Also complaining of nausea today  Day # 7 prednisolone   urine NA <10  IV albumin q6 started last pm  Creat 1.17 today better Tbili stable at 28.6, plts 41    INR Pend  Objective   Vital signs in last 24 hours: Temp:  [97.9 F (36.6 C)-98.2 F (36.8 C)] 98.2 F (36.8 C) (05/11 0522) Pulse Rate:  [88-103] 88 (05/11 0522) Resp:  [18-20] 18 (05/11 0522) BP: (123-133)/(72-85) 123/73 (05/11 0522) SpO2:  [93 %-95 %] 94 % (05/11 0522) Last BM Date: 01/06/20 General:     [name] in NAD deeply icteric Heart:  Regular rate and rhythm; no murmurs Lungs: Respirations even and unlabored, decreased breath sounds bilateral bases Abdomen:  Soft, large with fairly tense ascites, mild rather diffuse tenderness no guarding. Normal bowel sounds. Extremities: 2+ edema to the knees bilaterally Neurologic:  Alert and oriented,  grossly normal neurologically affect somewhat flat, mentating fairly well, asking appropriate questions, early asterixis Psych:  Cooperative. Normal mood and affect.  Intake/Output from previous day: 05/10 0701 - 05/11 0700 In: 1080 [P.O.:1080] Out: 300 [Urine:300] Intake/Output this shift: No intake/output data recorded.  Lab Results: Recent Labs    01/06/20 0530 01/07/20 0424 01/08/20 0545  WBC 10.2 12.4* 11.0*  HGB 11.7* 12.1* 10.8*  HCT 33.5* 34.7* 30.9*  PLT 50* 51* 41*   BMET Recent Labs    01/06/20 0530 01/07/20 0424 01/08/20 0545  NA 135 131* 135  K 4.0 4.6 4.2  CL 101 99 102  CO2 25 24 24   GLUCOSE 105* 110* 106*  BUN 28* 36* 40*  CREATININE 1.19 1.36* 1.17  CALCIUM 7.9* 7.8* 8.5*   LFT Recent  Labs    01/08/20 0545  PROT 6.8  ALBUMIN 2.6*  AST 141*  ALT 112*  ALKPHOS 118  BILITOT 28.6*   PT/INR Recent Labs    01/06/20 0530  LABPROT 27.5*  INR 2.7*    Studies/Results: No results found.     Assessment / Plan:    #76 54 year old [name] with decompensated alcoholic liver disease and severe alcohol induced hepatitis with associated severe coagulopathy, mild hepatic encephalopathy, severe thrombocytopenia and anasarca  Day #7 prednisolone- will calculate meld and DF score when INR results, however thus far has not had any significant improvement in parameters  #2 bump in creatinine yesterday, normalized today continue IV albumin today #3 worsening ascites, and significant peripheral edema-patient scheduled for paracentesis today to remove 3 L.  We will also start Lasix 40 mg p.o. every morning and Aldactone 50 mg p.o. every morning and watch kidney function carefully.  #4 encephalopathy-stable on Xifaxan and lactulose added yesterday twice daily #5 severe coagulopathy-we will give vitamin K 1 mg IV daily x3 #6 nausea and heartburn/chest discomfort-increase PPI to twice daily, has Mylanta ordered, added Zofran as needed.  Chest x-ray PA and lateral today-         Active Problems:   Cirrhosis (HCC)   Alcoholic hepatitis with ascites   Coagulopathy (HCC)   Portal hypertension (HCC)   Encephalopathy, hepatic ([place])     LOS: 7 days   [name]  PA-C 01/08/2020, 8:42 AM   I have reviewed the entire case in detail with the above APP and discussed the plan in detail.  Therefore, I agree with the diagnoses recorded above. In addition,  I have personally interviewed and examined the patient and have personally reviewed any abdominal/pelvic CT scan images.  My additional thoughts are as follows:  He has since had a 1.2 L paracentesis this afternoon (presumably all the fluid that could be accessed and removed).  Otherwise, his severe acute alcoholic  hepatitis and decompensated liver disease are stable in the sense that his bilirubin, INR and creatinine are stable.  He remains a very sick man, at high risk of decompensation either in or out of the hospital. We would like to keep him at least until tomorrow to ensure stability of creatinine after paracentesis.  Then low-dose diuretics might be started.  After that, there is likely be little else we can do in the inpatient setting.  What we are seeing at this point seems likely to be his liver function for the foreseeable future.   35 minutes were spent on this encounter (including chart review, history/exam, counseling/coordination of care, and documentation)  Nelida Meuse III Office:[phone]

## 2020-01-09 DIAGNOSIS — K922 Gastrointestinal hemorrhage, unspecified: Secondary | ICD-10-CM

## 2020-01-09 DIAGNOSIS — R040 Epistaxis: Secondary | ICD-10-CM

## 2020-01-09 DIAGNOSIS — R12 Heartburn: Secondary | ICD-10-CM

## 2020-01-09 DIAGNOSIS — D539 Nutritional anemia, unspecified: Secondary | ICD-10-CM

## 2020-01-09 LAB — COMPREHENSIVE METABOLIC PANEL
ALT: 120 U/L — ABNORMAL HIGH (ref 0–44)
AST: 148 U/L — ABNORMAL HIGH (ref 15–41)
Albumin: 2.7 g/dL — ABNORMAL LOW (ref 3.5–5.0)
Alkaline Phosphatase: 119 U/L (ref 38–126)
Anion gap: 11 (ref 5–15)
BUN: 38 mg/dL — ABNORMAL HIGH (ref 6–20)
CO2: 25 mmol/L (ref 22–32)
Calcium: 8.6 mg/dL — ABNORMAL LOW (ref 8.9–10.3)
Chloride: 102 mmol/L (ref 98–111)
Creatinine, Ser: 1.09 mg/dL (ref 0.61–1.24)
GFR calc Af Amer: >60 mL/min (ref >60)
GFR calc non Af Amer: >60 mL/min (ref >60)
Glucose, Bld: 110 mg/dL — ABNORMAL HIGH (ref 70–99)
Potassium: 4.2 mmol/L (ref 3.5–5.1)
Sodium: 138 mmol/L (ref 135–145)
Total Bilirubin: 30.1 mg/dL (ref 0.3–1.2)
Total Protein: 6.2 g/dL — ABNORMAL LOW (ref 6.5–8.1)

## 2020-01-09 LAB — CBC WITH DIFFERENTIAL/PLATELET
Abs Immature Granulocytes: 0.36 10*3/uL — ABNORMAL HIGH (ref 0.00–0.07)
Basophils Absolute: 0 10*3/uL (ref 0.0–0.1)
Basophils Relative: 0 %
Eosinophils Absolute: 0 10*3/uL (ref 0.0–0.5)
Eosinophils Relative: 0 %
HCT: 30 % — ABNORMAL LOW (ref 39.0–52.0)
Hemoglobin: 10.2 g/dL — ABNORMAL LOW (ref 13.0–17.0)
Immature Granulocytes: 3 %
Lymphocytes Relative: 4 %
Lymphs Abs: 0.4 10*3/uL — ABNORMAL LOW (ref 0.7–4.0)
MCH: 37.4 pg — ABNORMAL HIGH (ref 26.0–34.0)
MCHC: 34 g/dL (ref 30.0–36.0)
MCV: 109.9 fL — ABNORMAL HIGH (ref 80.0–100.0)
Monocytes Absolute: 1.6 10*3/uL — ABNORMAL HIGH (ref 0.1–1.0)
Monocytes Relative: 15 %
Neutro Abs: 8.3 10*3/uL — ABNORMAL HIGH (ref 1.7–7.7)
Neutrophils Relative %: 78 %
Platelets: 35 10*3/uL — ABNORMAL LOW (ref 150–400)
RBC: 2.73 MIL/uL — ABNORMAL LOW (ref 4.22–5.81)
RDW: 17.4 % — ABNORMAL HIGH (ref 11.5–15.5)
WBC: 10.8 10*3/uL — ABNORMAL HIGH (ref 4.0–10.5)
nRBC: 0 % (ref 0.0–0.2)

## 2020-01-09 LAB — PROTIME-INR
INR: 2.8 — ABNORMAL HIGH (ref 0.8–1.2)
Prothrombin Time: 28.6 seconds — ABNORMAL HIGH (ref 11.4–15.2)

## 2020-01-09 MED ORDER — SIMETHICONE 80 MG PO CHEW
80.0000 mg | CHEWABLE_TABLET | Freq: Two times a day (BID) | ORAL | Status: DC
  Administered 2020-01-09 – 2020-01-11 (×5): 80 mg via ORAL
  Filled 2020-01-09 (×5): qty 1

## 2020-01-09 MED ORDER — SPIRONOLACTONE 100 MG PO TABS
100.0000 mg | ORAL_TABLET | Freq: Every day | ORAL | Status: DC
  Administered 2020-01-09 – 2020-01-11 (×3): 100 mg via ORAL
  Filled 2020-01-09 (×3): qty 1

## 2020-01-09 NOTE — Progress Notes (Signed)
[facility]  PROGRESS NOTE    Jesse Esparza  [id number] DOB: Jun 30, 1966 DOA: 01/01/2020 PCP: Charlott Rakes, MD   Brief Narrative:   54 year old male with history of known cirrhosis, GERD, hyperlipidemia, hypertension, obesity presented with 3 weeks of worsening fatigue, nausea, decreased appetite. Also reported lower extremity edema, worsening abdominal distentionand jaundice. Patient reportedconsuming 4 to 6 cans of beers per day for years and stopped 2 weeks ago.  ED Course:Labs with transaminitis, elevated bili, hypoalbuminemia, hypercalcemia, elevated INR, elevated ammonia, macrocytic anemia.CT showed cirrhotic liver with splenomegaly and spontaneous splenorenal shunt, moderate ascites with cholelithiasis. Hospital course: [facility] for[facility] and management of acute alcoholic hepatitis with GI consultation. Patient underwent paracentesis on 5/5 with drainage of 1.4 lits yellow ascitic fluid.Echo showed GRADE 1 diastolic dysfunction with EF 70-75%.   Assessment & Plan:   Active Problems:   Cirrhosis ([place])   Alcoholic hepatitis with ascites   Coagulopathy (HCC)   Portal hypertension (HCC)   Encephalopathy, hepatic (HCC)  Acute alcoholic hepatitis Liver cirrhosis with ascites Hepatic encephalopathy Coagulopathy secondary to cirrhosis     - GI onboard, appreciate assistance.      - MELD score is 32     - On day 8 of steroids      - Autoimmune labs including IgG and SMAb are elevated.     - ANA: "Due to a lack of reproducibility with this patient sample, a valid result could not be obtained."     - he is s/p paracentisis w/o evidence of SBP     - not a liver transplant candidate d/t ongoing alcohol abuse      - 5/12: continuing steroids today. T bili is up to 30 today and his INR remains elevated     - this is Day 2/3 of vitamin K IV; Hgb is stable     - renal fxn is ok; continuing lasix; GI to increase aldactone to 200mg  qday     - continue lactulose,  xifaxan, thiamine, protonix     - has PRN maalox, add simethicone for gas  Epistaxis in the setting of thrombocytopenia secondary to cirrhosis with splenomegaly.      - 5/12: denies episodes today, monitor  Macrocytic anemia Thrombocytopenia     - d/t liver issue, EtOH abuse     - plts 35 today; monitor  Intermittent UGIB     -Patient reported history of black solid stool and intermittent bright red blood over the past 2 months.     - FOBT positive     - 5/12: possible EGD tomorrow per GI  Acute kidney injury:      - Patient's baseline creatinine around 0.7 last week.       - Slowly up trended and up to 1.36     - 5/12: SCr is 1.09; UOP ok, tolerating diuretics, monitor  Hypokalemia Hyponatremia     - resolved, monitor  Morbid obesity     - Estimated body mass index is 42.28 kg/m     - deit, exercise counseling  DVT prophylaxis: SCDs Code Status: FULL Family Communication: Spoke with sister at bedside.   Status is: Inpatient  Remains inpatient appropriate because:Ongoing diagnostic testing needed not appropriate for outpatient work up   Dispo: The patient is from: Home              Anticipated d/c is to: Home              Anticipated d/c date is: >  3 days              Patient currently is not medically stable to d/c.  Consultants:   GI   ROS:  Reports gas, bloating. Denies CP, N, V . Remainder 10-pt ROS is negative for all not previously mentioned.  Subjective: "I feel bloated after eating."  Objective: Vitals:   01/08/20 2252 01/09/20 0600 01/09/20 0942 01/09/20 1333  BP: 130/66 133/66 125/68 (!) 143/70  Pulse: 79 (!) 102 97 92  Resp: 17 18 19 19   Temp: 98 F (36.7 C) 98.6 F (37 C) 98.3 F (36.8 C) 97.7 F (36.5 C)  TempSrc: Oral Oral Oral Oral  SpO2: 96% 96% 94% 97%  Weight:      Height:        Intake/Output Summary (Last 24 hours) at 01/09/2020 1453 Last data filed at 01/09/2020 0203 Gross per 24 hour  Intake 236 ml  Output 975 ml    Net -739 ml   Filed Weights   01/01/20 1737  Weight: 115.3 kg    Examination:  General: 54 y.o. male resting in bed in NAD Cardiovascular: RRR, +S1, S2, no m/g/r, equal pulses throughout Respiratory: CTABL, no w/r/r, normal WOB GI: BS+, distended, NT, no masses noted, no organomegaly noted MSK: No c/c; BLE edema Neuro: A&O x 3, no focal deficits Psyc: Appropriate interaction and affect, calm/cooperative   Data Reviewed: I have personally reviewed following labs and imaging studies.  CBC: Recent Labs  Lab 01/05/20 0626 01/06/20 0530 01/07/20 0424 01/08/20 0545 01/09/20 0516  WBC 7.9 10.2 12.4* 11.0* 10.8*  NEUTROABS  --   --   --   --  8.3*  HGB 11.5* 11.7* 12.1* 10.8* 10.2*  HCT 33.6* 33.5* 34.7* 30.9* 30.0*  MCV 108.7* 108.1* 108.8* 108.0* 109.9*  PLT 48* 50* 51* 41* 35*   Basic Metabolic Panel: Recent Labs  Lab 01/05/20 0626 01/06/20 0530 01/07/20 0424 01/08/20 0545 01/09/20 0516  NA 133* 135 131* 135 138  K 3.9 4.0 4.6 4.2 4.2  CL 100 101 99 102 102  CO2 24 25 24 24 25   GLUCOSE 146* 105* 110* 106* 110*  BUN 26* 28* 36* 40* 38*  CREATININE 1.24 1.19 1.36* 1.17 1.09  CALCIUM 7.9* 7.9* 7.8* 8.5* 8.6*   GFR: Estimated Creatinine Clearance: 89.9 mL/min (by C-G formula based on SCr of 1.09 mg/dL). Liver Function Tests: Recent Labs  Lab 01/05/20 0626 01/06/20 0530 01/07/20 0424 01/08/20 0545 01/09/20 0516  AST 118* 123* 142* 141* 148*  ALT 79* 85* 106* 112* 120*  ALKPHOS 140* 130* 135* 118 119  BILITOT 28.9* 28.4* 28.5* 28.6* 30.1*  PROT 6.4* 6.4* 6.3* 6.8 6.2*  ALBUMIN 1.8* 1.8* 1.8* 2.6* 2.7*   No results for input(s): LIPASE, AMYLASE in the last 168 hours. Recent Labs  Lab 01/04/20 0458 01/05/20 0626 01/06/20 0530  AMMONIA 107* 122* 99*   Coagulation Profile: Recent Labs  Lab 01/04/20 0457 01/05/20 0626 01/06/20 0530 01/08/20 0917 01/09/20 0516  INR 3.0* 2.7* 2.7* 2.8* 2.8*   Cardiac Enzymes: No results for input(s): CKTOTAL,  CKMB, CKMBINDEX, TROPONINI in the last 168 hours. BNP (last 3 results) No results for input(s): PROBNP in the last 8760 hours. HbA1C: No results for input(s): HGBA1C in the last 72 hours. CBG: No results for input(s): GLUCAP in the last 168 hours. Lipid Profile: No results for input(s): CHOL, HDL, LDLCALC, TRIG, CHOLHDL, LDLDIRECT in the last 72 hours. Thyroid Function Tests: No results for input(s): TSH, T4TOTAL, FREET4,  T3FREE, THYROIDAB in the last 72 hours. Anemia Panel: No results for input(s): VITAMINB12, FOLATE, FERRITIN, TIBC, IRON, RETICCTPCT in the last 72 hours. Sepsis Labs: No results for input(s): PROCALCITON, LATICACIDVEN in the last 168 hours.  Recent Results (from the past 240 hour(s))  Respiratory Panel by RT PCR (Flu A&B, Covid) - Nasopharyngeal Swab     Status: None   Collection Time: 01/01/20  2:07 PM   Specimen: Nasopharyngeal Swab  Result Value Ref Range Status   SARS Coronavirus 2 by RT PCR NEGATIVE NEGATIVE Final    Comment: (NOTE) SARS-CoV-2 target nucleic acids are NOT DETECTED. The SARS-CoV-2 RNA is generally detectable in upper respiratoy specimens during the acute phase of infection. The lowest concentration of SARS-CoV-2 viral copies this assay can detect is 131 copies/mL. A negative result does not preclude SARS-Cov-2 infection and should not be used as the sole basis for treatment or other patient management decisions. A negative result may occur with  improper specimen collection/handling, submission of specimen other than nasopharyngeal swab, presence of viral mutation(s) within the areas targeted by this assay, and inadequate number of viral copies (<131 copies/mL). A negative result must be combined with clinical observations, patient history, and epidemiological information. The expected result is Negative. Fact Sheet for Patients:  PinkCheek.be Fact Sheet for Healthcare Providers:    GravelBags.it This test is not yet ap proved or cleared by the [place] FDA and  has been authorized for detection and/or diagnosis of SARS-CoV-2 by FDA under an Emergency Use Authorization (EUA). This EUA will remain  in effect (meaning this test can be used) for the duration of the COVID-19 declaration under Section 564(b)(1) of the Act, 21 U.S.C. section 360bbb-3(b)(1), unless the authorization is terminated or revoked sooner.    Influenza A by PCR NEGATIVE NEGATIVE Final   Influenza B by PCR NEGATIVE NEGATIVE Final    Comment: (NOTE) The Xpert Xpress SARS-CoV-2/FLU/RSV assay is intended as an aid in  the diagnosis of influenza from Nasopharyngeal swab specimens and  should not be used as a sole basis for treatment. Nasal washings and  aspirates are unacceptable for Xpert Xpress SARS-CoV-2/FLU/RSV  testing. Fact Sheet for Patients: PinkCheek.be Fact Sheet for Healthcare Providers: GravelBags.it This test is not yet approved or cleared by the [place] FDA and  has been authorized for detection and/or diagnosis of SARS-CoV-2 by  FDA under an Emergency Use Authorization (EUA). This EUA will remain  in effect (meaning this test can be used) for the duration of the  Covid-19 declaration under Section 564(b)(1) of the Act, 21  U.S.C. section 360bbb-3(b)(1), unless the authorization is  terminated or revoked. Performed at [facility], [place] [address], [place], [place] [address]   Culture, body fluid-bottle     Status: None   Collection Time: 01/02/20 12:36 PM   Specimen: Fluid  Result Value Ref Range Status   Specimen Description FLUID PERITONEAL  Final   Special Requests BOTTLES DRAWN AEROBIC AND ANAEROBIC 10CC  Final   Culture   Final    NO GROWTH 5 DAYS Performed at [facility], [place] [address], [place], [place] [address]    Report Status 01/07/2020 FINAL   Final  Gram stain     Status: None   Collection Time: 01/02/20 12:36 PM   Specimen: Fluid  Result Value Ref Range Status   Specimen Description FLUID PERITONEAL  Final   Special Requests NONE  Final   Gram Stain   Final  FEW WBC PRESENT, PREDOMINANTLY MONONUCLEAR NO ORGANISMS SEEN Performed at [facility] [address], [place], [place] [address]    Report Status 01/02/2020 FINAL  Final      Radiology Studies: DG Chest 2 View  Result Date: 01/08/2020 CLINICAL DATA:  Chest pain.  Pleural effusion. EXAM: CHEST - 2 VIEW COMPARISON:  05/23/2017; CT abdomen pelvis-01/01/2020 FINDINGS: Grossly unchanged borderline enlarged cardiac silhouette and mediastinal contours with mild tortuosity and potential ectasia of the thoracic aorta. Grossly unchanged minimal left basilar/retrocardiac heterogeneous opacities favored to represent atelectasis. No new focal airspace opacities. No pleural effusion or pneumothorax. No evidence of edema. No acute osseous abnormalities. Stigmata of dish within the thoracic spine. IMPRESSION: Similar findings of borderline cardiomegaly and left basilar atelectasis/scar without superimposed acute cardiopulmonary disease. Specifically, no evidence of pleural effusion. Electronically Signed   By: Sandi Mariscal M.D.   On: 01/08/2020 17:12   US Paracentesis  Result Date: 01/08/2020 INDICATION: Alcoholic hepatitis and decompensated cirrhosis. Recurrent ascites. Request for therapeutic paracentesis. EXAM: ULTRASOUND GUIDED RIGHT LOWER QUADRANT PARACENTESIS MEDICATIONS: None. COMPLICATIONS: None immediate. PROCEDURE: Informed written consent was obtained from the patient after a discussion of the risks, benefits and alternatives to treatment. A timeout was performed prior to the initiation of the procedure. Initial ultrasound scanning demonstrates a small to moderate amount of ascites within the right lower abdominal quadrant. The right lower abdomen was prepped and draped in  the usual sterile fashion. 1% lidocaine was used for local anesthesia. Following this, a 19 gauge, 10-cm, Yueh catheter was introduced. An ultrasound image was saved for documentation purposes. The paracentesis was performed. The catheter was removed and a dressing was applied. The patient tolerated the procedure well without immediate post procedural complication. FINDINGS: A total of approximately 1.2 L of clear, bright yellow fluid was removed. IMPRESSION: Successful ultrasound-guided paracentesis yielding 1.2 liters of peritoneal fluid. Read by: Ascencion Dike PA-C Electronically Signed   By: Sandi Mariscal M.D.   On: 01/08/2020 16:11     Scheduled Meds: . feeding supplement (ENSURE ENLIVE)  237 mL Oral BID BM  . folic acid  1 mg Oral Daily  . furosemide  40 mg Oral Daily  . lactulose  20 g Oral BID  . multivitamin with minerals  1 tablet Oral Daily  . pantoprazole  40 mg Oral BID AC  . prednisoLONE  40 mg Oral Daily  . rifaximin  550 mg Oral BID  . spironolactone  100 mg Oral Daily  . thiamine  100 mg Oral Daily   Continuous Infusions: . phytonadione (VITAMIN K) IV 1 mg (01/09/20 0859)     LOS: 8 days    Time spent: 35 minutes spent in the coordination of care today.    Jonnie Finner, DO [facility]  If 7PM-7AM, please contact night-coverage [email or web address] 01/09/2020, 2:53 PM

## 2020-01-09 NOTE — TOC Progression Note (Signed)
Transition of Care [facility]) - Progression Note    Patient Details  Name: Jesse Esparza MRN: [id number] Date of Birth: [birth year]  Transition of Care [facility]) CM/SW Contact  Donae Kueker, Marjie Skiff, RN Phone Number: 01/09/2020, 12:47 PM  Clinical Narrative:    This CM spoke with pt and sister at bedside for dc planning. Pt may need MATCH letter at DC for dc medications. Pt was previously active at [facility] for PCP. He has not been there in about 2 years. This CM called [facility] and was able to secure a follow up appointment which was placed on pt AVS. This CM has not heard back from [facility] for outpatient rehab services. Family services information was also placed on AVS with walk in hours for pt to follow up with outpatient alcohol treatment. TOC will continue to follow.   Expected Discharge Plan: Home/Self Care Barriers to Discharge: Continued Medical Work up  Expected Discharge Plan and Services Expected Discharge Plan: Home/Self Care In-house Referral: Clinical Social Work Discharge Planning Services: Medication Assistance                       Social Determinants of Health (SDOH) Interventions    Readmission Risk Interventions No flowsheet data found.

## 2020-01-09 NOTE — Progress Notes (Addendum)
[name], male   DOB: [birth year], 54 y.o.   MRN: [id number]    Progress Note   Subjective  Day # 8  CC; severe ETOH hepatitis, decompensated cirrhosis  Sitting up in chair today, mentating well, patient sister was in the room and he has his daughter on the phone during our conversation.  Continues to complain of heartburn and also epigastric pain worse with meals.  Nausea somewhat improved with use of Zofran.  No vomiting.  He says he knows sometimes he says the wrong words.  Having loose stools  Day #8 prednisolone--- Katy Apo score suggests  patient is a nonresponder score   Labs today - INR 2.8 BUN 38/ Creat 1.09  Tbili 30.1/alk phos119/ast 148/ alt 120  Hgb 10.2, plts 35   paracentesis 5/11- 1.2 liters  CXR - cardiomegaly / no sig effusion      Objective   Vital signs in last 24 hours: Temp:  [97.9 F (36.6 C)-98.6 F (37 C)] 98.6 F (37 C) (05/12 0600) Pulse Rate:  [79-102] 102 (05/12 0600) Resp:  [16-18] 18 (05/12 0600) BP: (121-133)/(66-76) 133/66 (05/12 0600) SpO2:  [95 %-96 %] 96 % (05/12 0600) Last BM Date: 01/08/20 General:   Deeply Jaundiced [name] in NAD up in chair Heart:  Regular rate and rhythm; no murmurs Lungs: Respirations even and unlabored, lungs CTA bilaterally Abdomen:  Soft, distended, mildly tender across the upper abdomen. Normal bowel sounds. Extremities: 2+ edema bilaterally ankles and shins Neurologic:  Alert and oriented,  grossly normal neurologically.  Mentating fairly well, positive early asterixis Psych:  Cooperative. Normal mood and affect.  Intake/Output from previous day: 05/11 0701 - 05/12 0700 In: 476 [P.O.:476] Out: 975 [Urine:975]  Lab Results: Recent Labs    01/07/20 0424 01/08/20 0545 01/09/20 0516  WBC 12.4* 11.0* 10.8*  HGB 12.1* 10.8* 10.2*  HCT 34.7* 30.9* 30.0*  PLT 51* 41* 35*   BMET Recent Labs    01/07/20 0424 01/08/20 0545 01/09/20 0516  NA 131* 135 138  K 4.6 4.2 4.2  CL 99  102 102  CO2 '24 24 25  '$ GLUCOSE 110* 106* 110*  BUN 36* 40* 38*  CREATININE 1.36* 1.17 1.09  CALCIUM 7.8* 8.5* 8.6*   LFT Recent Labs    01/09/20 0516  PROT 6.2*  ALBUMIN 2.7*  AST 148*  ALT 120*  ALKPHOS 119  BILITOT 30.1*   PT/INR Recent Labs    01/08/20 0917 01/09/20 0516  LABPROT 28.4* 28.6*  INR 2.8* 2.8*    Studies/Results: DG Chest 2 View  Result Date: 01/08/2020 CLINICAL DATA:  Chest pain.  Pleural effusion. EXAM: CHEST - 2 VIEW COMPARISON:  05/23/2017; CT abdomen pelvis-01/01/2020 FINDINGS: Grossly unchanged borderline enlarged cardiac silhouette and mediastinal contours with mild tortuosity and potential ectasia of the thoracic aorta. Grossly unchanged minimal left basilar/retrocardiac heterogeneous opacities favored to represent atelectasis. No new focal airspace opacities. No pleural effusion or pneumothorax. No evidence of edema. No acute osseous abnormalities. Stigmata of dish within the thoracic spine. IMPRESSION: Similar findings of borderline cardiomegaly and left basilar atelectasis/scar without superimposed acute cardiopulmonary disease. Specifically, no evidence of pleural effusion. Electronically Signed   By: Sandi Mariscal M.D.   On: 01/08/2020 17:12   US Paracentesis  Result Date: 01/08/2020 INDICATION: Alcoholic hepatitis and decompensated cirrhosis. Recurrent ascites. Request for therapeutic paracentesis. EXAM: ULTRASOUND GUIDED RIGHT LOWER QUADRANT PARACENTESIS MEDICATIONS: None. COMPLICATIONS: None immediate. PROCEDURE: Informed written consent was obtained from the patient after a discussion of the  risks, benefits and alternatives to treatment. A timeout was performed prior to the initiation of the procedure. Initial ultrasound scanning demonstrates a small to moderate amount of ascites within the right lower abdominal quadrant. The right lower abdomen was prepped and draped in the usual sterile fashion. 1% lidocaine was used for local anesthesia. Following  this, a 19 gauge, 10-cm, Yueh catheter was introduced. An ultrasound image was saved for documentation purposes. The paracentesis was performed. The catheter was removed and a dressing was applied. The patient tolerated the procedure well without immediate post procedural complication. FINDINGS: A total of approximately 1.2 L of clear, bright yellow fluid was removed. IMPRESSION: Successful ultrasound-guided paracentesis yielding 1.2 liters of peritoneal fluid. Read by: Ascencion Dike PA-C Electronically Signed   By: Sandi Mariscal M.D.   On: 01/08/2020 16:11       Assessment / Plan:    #44 54 year old [name] with severe alcoholic hepatitis with no improvement in parameters over the past week on prednisolone. Severe decompensated alcohol induced cirrhosis  M ELD 32 Discriminant function score 91.6  We will continue prednisolone for now while inpatient  #2 hepatic encephalopathy-continue current dose of lactulose, having 2-3 loose stools per day, continue Xifaxan 550 twice daily  #3 anasarca-only 1.7 L removed with paracentesis yesterday Have started low-dose diuretics Continue Lasix 40 mg p.o. every morning and will increase Aldactone 200 mg p.o. every morning Watch renal function carefully  #4 persistent complaints of heartburn and postprandial epigastric pain despite PPI Maalox etc.  Continue above regimen We will keep n.p.o. after midnight-have tentatively placed him on schedule for EGD tomorrow at lunchtime with Dr. Loletha Carrow, depending on schedule EGD may be bumped to Friday.  #5 dispo-patient seems to have good family support, he is planning on living with his parents at discharge, had been living alone previously.  He is committed to continuing complete abstinence from EtOH and seems to understand the severity of his liver disease. He is working on establishing a PCP, we will follow him closely outpatient as well, once discharged.    LOS: 8 days   [name] PA-C 01/09/2020,  8:57 AM     [facility] Attending   I have taken an interval history, reviewed the chart and examined the patient. I agree with the Advanced Practitioner's note, impression and recommendations.   I suspect the GERD sxs are from ascites. Hopefully will get better w/ diuresis.  Lillie score indicates non-responder to steroids - he is not a transplant candidate at this point due to no insurance and alcohol use recently. So maybe continue steroids for now. Nothing else we can offer at this point but will follow w/ you.  Gatha Mayer, MD, [facility] 01/09/2020 4:16 PM

## 2020-01-10 LAB — CBC WITH DIFFERENTIAL/PLATELET
Abs Immature Granulocytes: 0.5 10*3/uL — ABNORMAL HIGH (ref 0.00–0.07)
Basophils Absolute: 0 10*3/uL (ref 0.0–0.1)
Basophils Relative: 0 %
Eosinophils Absolute: 0 10*3/uL (ref 0.0–0.5)
Eosinophils Relative: 0 %
HCT: 30.2 % — ABNORMAL LOW (ref 39.0–52.0)
Hemoglobin: 10.4 g/dL — ABNORMAL LOW (ref 13.0–17.0)
Immature Granulocytes: 4 %
Lymphocytes Relative: 4 %
Lymphs Abs: 0.6 10*3/uL — ABNORMAL LOW (ref 0.7–4.0)
MCH: 37.8 pg — ABNORMAL HIGH (ref 26.0–34.0)
MCHC: 34.4 g/dL (ref 30.0–36.0)
MCV: 109.8 fL — ABNORMAL HIGH (ref 80.0–100.0)
Monocytes Absolute: 1.7 10*3/uL — ABNORMAL HIGH (ref 0.1–1.0)
Monocytes Relative: 14 %
Neutro Abs: 9.9 10*3/uL — ABNORMAL HIGH (ref 1.7–7.7)
Neutrophils Relative %: 78 %
Platelets: 38 10*3/uL — ABNORMAL LOW (ref 150–400)
RBC: 2.75 MIL/uL — ABNORMAL LOW (ref 4.22–5.81)
RDW: 17.3 % — ABNORMAL HIGH (ref 11.5–15.5)
WBC: 12.7 10*3/uL — ABNORMAL HIGH (ref 4.0–10.5)
nRBC: 0 % (ref 0.0–0.2)

## 2020-01-10 LAB — COMPREHENSIVE METABOLIC PANEL
ALT: 142 U/L — ABNORMAL HIGH (ref 0–44)
AST: 168 U/L — ABNORMAL HIGH (ref 15–41)
Albumin: 2.4 g/dL — ABNORMAL LOW (ref 3.5–5.0)
Alkaline Phosphatase: 150 U/L — ABNORMAL HIGH (ref 38–126)
Anion gap: 11 (ref 5–15)
BUN: 38 mg/dL — ABNORMAL HIGH (ref 6–20)
CO2: 29 mmol/L (ref 22–32)
Calcium: 8.8 mg/dL — ABNORMAL LOW (ref 8.9–10.3)
Chloride: 97 mmol/L — ABNORMAL LOW (ref 98–111)
Creatinine, Ser: 1.18 mg/dL (ref 0.61–1.24)
GFR calc Af Amer: >60 mL/min (ref >60)
GFR calc non Af Amer: >60 mL/min (ref >60)
Glucose, Bld: 117 mg/dL — ABNORMAL HIGH (ref 70–99)
Potassium: 4.4 mmol/L (ref 3.5–5.1)
Sodium: 137 mmol/L (ref 135–145)
Total Bilirubin: 29.9 mg/dL (ref 0.3–1.2)
Total Protein: 6.3 g/dL — ABNORMAL LOW (ref 6.5–8.1)

## 2020-01-10 LAB — PROTIME-INR
INR: 2.8 — ABNORMAL HIGH (ref 0.8–1.2)
Prothrombin Time: 28.9 seconds — ABNORMAL HIGH (ref 11.4–15.2)

## 2020-01-10 NOTE — H&P (View-Only) (Signed)
[name], male   DOB: 04-30-1966, 54 y.o.   MRN: [id number]    Progress Note   Subjective   day # 9 CC; severe alcoholic hepatitis and decompensated EtOH induced cirrhosis  Day #9 prednisolone  INR 2.8 Creatinine 1.18 T bili 29.9/alk phos 150/AST 168/ALT 142 WBC 12.7, hemoglobin 10.4, platelets 38  Up in chair, says he is feeling a little bit better trying to get up and walk with walker but says he is very weak and will have dizziness with standing  Upper abdominal pain about the same  Additional review of systems.  Denies chest pain or dyspnea.  He is fatigued and his legs feel weak with brief ambulation with the aid of a walker. Feels that his head is "clearing".   Objective   Vital signs in last 24 hours: Temp:  [97.7 F (36.5 C)-98.1 F (36.7 C)] 98.1 F (36.7 C) (05/13 0545) Pulse Rate:  [92-95] 95 (05/13 0545) Resp:  [18-20] 20 (05/13 0545) BP: (102-143)/(62-70) 102/62 (05/13 0545) SpO2:  [95 %-99 %] 99 % (05/13 0545) Last BM Date: 01/08/20 General:    [name] in NAD-deeply icteric Heart:  Regular rate and rhythm; no murmurs Lungs: Respirations even and unlabored, lungs CTA bilaterally Abdomen:  Soft, nontender and nondistended. Normal bowel sounds. Extremities:  Without edema. Neurologic:  Alert and oriented,  grossly normal neurologically. Psych:  Cooperative. Normal mood and affect.  Intake/Output from previous day: 05/12 0701 - 05/13 0700 In: -  Out: 6568 [Urine:1650] Intake/Output this shift: No intake/output data recorded.  Lab Results: Recent Labs    01/08/20 0545 01/09/20 0516 01/10/20 0538  WBC 11.0* 10.8* 12.7*  HGB 10.8* 10.2* 10.4*  HCT 30.9* 30.0* 30.2*  PLT 41* 35* 38*   BMET Recent Labs    01/08/20 0545 01/09/20 0516 01/10/20 0538  NA 135 138 137  K 4.2 4.2 4.4  CL 102 102 97*  CO2 '24 25 29  '$ GLUCOSE 106* 110* 117*  BUN 40* 38* 38*  CREATININE 1.17 1.09 1.18  CALCIUM 8.5* 8.6* 8.8*   LFT Recent  Labs    01/10/20 0538  PROT 6.3*  ALBUMIN 2.4*  AST 168*  ALT 142*  ALKPHOS 150*  BILITOT 29.9*   PT/INR Recent Labs    01/09/20 0516 01/10/20 0538  LABPROT 28.6* 28.9*  INR 2.8* 2.8*    Studies/Results: DG Chest 2 View  Result Date: 01/08/2020 CLINICAL DATA:  Chest pain.  Pleural effusion. EXAM: CHEST - 2 VIEW COMPARISON:  05/23/2017; CT abdomen pelvis-01/01/2020 FINDINGS: Grossly unchanged borderline enlarged cardiac silhouette and mediastinal contours with mild tortuosity and potential ectasia of the thoracic aorta. Grossly unchanged minimal left basilar/retrocardiac heterogeneous opacities favored to represent atelectasis. No new focal airspace opacities. No pleural effusion or pneumothorax. No evidence of edema. No acute osseous abnormalities. Stigmata of dish within the thoracic spine. IMPRESSION: Similar findings of borderline cardiomegaly and left basilar atelectasis/scar without superimposed acute cardiopulmonary disease. Specifically, no evidence of pleural effusion. Electronically Signed   By: Sandi Mariscal M.D.   On: 01/08/2020 17:12   US Paracentesis  Result Date: 01/08/2020 INDICATION: Alcoholic hepatitis and decompensated cirrhosis. Recurrent ascites. Request for therapeutic paracentesis. EXAM: ULTRASOUND GUIDED RIGHT LOWER QUADRANT PARACENTESIS MEDICATIONS: None. COMPLICATIONS: None immediate. PROCEDURE: Informed written consent was obtained from the patient after a discussion of the risks, benefits and alternatives to treatment. A timeout was performed prior to the initiation of the procedure. Initial ultrasound scanning demonstrates a small to moderate amount of ascites  within the right lower abdominal quadrant. The right lower abdomen was prepped and draped in the usual sterile fashion. 1% lidocaine was used for local anesthesia. Following this, a 19 gauge, 10-cm, Yueh catheter was introduced. An ultrasound image was saved for documentation purposes. The paracentesis was  performed. The catheter was removed and a dressing was applied. The patient tolerated the procedure well without immediate post procedural complication. FINDINGS: A total of approximately 1.2 L of clear, bright yellow fluid was removed. IMPRESSION: Successful ultrasound-guided paracentesis yielding 1.2 liters of peritoneal fluid. Read by: Ascencion Dike PA-C Electronically Signed   By: Sandi Mariscal M.D.   On: 01/08/2020 16:11       Assessment / Plan:    #93 55 year old [name] with severe alcoholic hepatitis and decompensated cirrhosis with no improvement in parameters day 8 on prednisolone  M ELD 32 discriminant function 91.6  Continuing prednisolone for now while inpatient as have little else to offer him.  #2 hepatic encephalopathy-stable, continue current dose of lactulose and Xifaxan 550 twice daily #3 anasarca-have started diuretics Lasix 40 mg daily and Aldactone 100 mg p.o. daily, renal function stable  #4 persistent complaints of heartburn and postprandial epigastric pain despite PPI, Maalox etc. Patient has not had screening EGD  Plan; patient was tentatively scheduled for EGD today, this has been rescheduled for tomorrow morning. Continue current regimen as outlined above. Hopefully he will be able to be discharged in the next 24 to 48 hours, but will need close outpatient monitoring for further decompensation.    Active Problems:   Cirrhosis ([place])   Alcoholic hepatitis with ascites   Coagulopathy ([facility])   Portal hypertension ([facility])   Encephalopathy, hepatic ([facility])   Heartburn     LOS: 9 days   Jesse Esparza  01/10/2020, 10:45 AM   I have discussed the case with the PA, and that is the plan I formulated. I personally interviewed and examined the patient.  He is more alert in the last couple of days since the addition of lactulose to the rifaximin. He remains deeply jaundiced and icteric, which I expect will persist for at least weeks. Upper endoscopy is planned  for tomorrow for variceal screening, and also to investigate patient's complaints of epigastric discomfort and heartburn.  If varices are found, it would help with prognosis, especially if the patient presents at some point with upper GI bleeding.  If varices are found tomorrow, I would not advocate starting nonselective beta-blocker given his decompensated disease and demonstrated renal sensitivity thus far.  I believe we have done all we can for him in the inpatient setting.  He will remain a very sick man for weeks to perhaps months to come.  As I had previously noted, what we see right now might be his liver function for the foreseeable future.  He can most likely be discharged from the hospital tomorrow after his upper endoscopy if the internal medicine service is agreeable.  We would arrange as close follow-up as possible with me, which unfortunately may be limited by the patient's lack of insurance and resources.  At the time of discharge, I would keep him on the current doses of diuretics and rifaximin and lactulose. Keep the current dose of prednisolone for total of 20 days, then decrease to 30 mg a day for 4 days, 20 mg a day for 4 days, 10 mg a day for 4 days and stop.  Total time 35 minutes  Nelida Meuse III Office: [phone]

## 2020-01-10 NOTE — Progress Notes (Signed)
Marland Kitchen  PROGRESS NOTE    Matther Doupe  [id number] DOB: Apr 23, 1966 DOA: 01/01/2020 PCP: Charlott Rakes, MD   Brief Narrative:   54 year old male with history of known cirrhosis, GERD, hyperlipidemia, hypertension, obesity presented with 3 weeks of worsening fatigue, nausea, decreased appetite. Also reported lower extremity edema, worsening abdominal distentionand jaundice. Patient reportedconsuming 4 to 6 cans of beers per day for years and stopped 2 weeks ago.  ED Course:Labs with transaminitis, elevated bili, hypoalbuminemia, hypercalcemia, elevated INR, elevated ammonia, macrocytic anemia.CT showed cirrhotic liver with splenomegaly and spontaneous splenorenal shunt, moderate ascites with cholelithiasis. Hospital course: Patient admitted to [facility] forfurther evaluation and management of acute alcoholic hepatitis with GI consultation. Patient underwent paracentesis on 5/5 with drainage of 1.4 lits yellow ascitic fluid.Echo showed GRADE 1 diastolic dysfunction with EF 70-75%.  5/13: Seen walking the hallways in good spirits. Liver numbers are not improving. Steroids have not helped. He will continue them while inpatient, however. EGD is scheduled for tomorrow. Hopeful d/c to home tomorrow after procedure. Will need PCP and GI follow up.    Assessment & Plan:   Active Problems:   Cirrhosis ([place])   Alcoholic hepatitis with ascites   Coagulopathy (HCC)   Portal hypertension (HCC)   Encephalopathy, hepatic (HCC)   Heartburn  Acute alcoholic hepatitis Liver cirrhosis with ascites Hepatic encephalopathy Coagulopathy secondary to cirrhosis     - GI onboard, appreciate assistance.      - MELD score is 32     - On day 8 of steroids      - Autoimmune labs including IgG and SMAb are elevated.     - ANA: "Due to a lack of reproducibility with this patient sample, a valid result could not be obtained."     - he is s/p paracentisis w/o evidence of SBP     - not a liver transplant candidate  d/t ongoing alcohol abuse      - 5/12: continuing steroids today. T bili is up to 30 today and his INR remains elevated     - this is Day 2/3 of vitamin K IV; Hgb is stable     - renal fxn is ok; continuing lasix; GI to increase aldactone to 200mg  qday     - continue lactulose, xifaxan, thiamine, protonix     - has PRN maalox, add simethicone for gas     - 5/13: He tells me that simethicone has helped some with his gas, but he is still having problems. To have EGD tomorrow.      - continuing steroids while inpatient.   Epistaxis in the setting of thrombocytopenia secondary to cirrhosis with splenomegaly.      - 5/12: denies episodes today, monitor     - 5/13: No further episode, monitor.  Macrocytic anemia Thrombocytopenia     - d/t liver issue, EtOH abuse     - plts 35 today; monitor     - 5/13: plts stable at 38  Intermittent UGIB     -Patient reported history of black solid stool and intermittent bright red blood over the past 2 months.     - FOBT positive     - 5/12: possible EGD tomorrow per GI     - 5/13: EGD tomorrow.  Acute kidney injury:      - Patient's baseline creatinine around 0.7 last week.       - Slowly up trended and up to 1.36     - 5/12: SCr is 1.09; UOP  ok, tolerating diuretics, monitor     - 5/13: Ccr is slightly up at 1.18. Continue diuretics. UOP ok.  Hypokalemia Hyponatremia     - resolved, monitor  Morbid obesity     - Estimated body mass index is 42.28 kg/m     - deit, exercise counseling  DVT prophylaxis: SCDs Code Status: FULL   Status is: Inpatient  Remains inpatient appropriate because:Ongoing diagnostic testing needed not appropriate for outpatient work up   Dispo: The patient is from: Home              Anticipated d/c is to: Home              Anticipated d/c date is: 2 days              Patient currently is not medically stable to d/c.  Consultants:   GI  ROS:  Reports bloating is improving. Denies N, V, dyspnea, CP .  Remainder 10-pt ROS is negative for all not previously mentioned.  Subjective: "I had a poop and it made it feel better."  Objective: Vitals:   01/09/20 2035 01/10/20 0545 01/10/20 1120 01/10/20 1324  BP: 128/67 102/62 111/67 124/68  Pulse: 93 95 90 92  Resp: 18 20  16   Temp: 97.9 F (36.6 C) 98.1 F (36.7 C) 97.6 F (36.4 C) 97.6 F (36.4 C)  TempSrc: Oral Oral Oral Oral  SpO2: 95% 99% 96% 99%  Weight:      Height:        Intake/Output Summary (Last 24 hours) at 01/10/2020 1731 Last data filed at 01/10/2020 1406 Gross per 24 hour  Intake 480 ml  Output 1650 ml  Net -1170 ml   Filed Weights   01/01/20 1737  Weight: 115.3 kg    Examination:  General: 54 y.o. male resting in bed in NAD Cardiovascular: RRR, +S1, S2, no m/g/r, equal pulses throughout Respiratory: CTABL, no w/r/r, normal WOB GI: BS+, NDNT, no masses noted, no organomegaly noted MSK: No c/c; BLE edema Skin: No rashes, bruises, ulcerations noted Neuro: A&O x 3, no focal deficits Psyc: Appropriate interaction and affect, calm/cooperative   Data Reviewed: I have personally reviewed following labs and imaging studies.  CBC: Recent Labs  Lab 01/06/20 0530 01/07/20 0424 01/08/20 0545 01/09/20 0516 01/10/20 0538  WBC 10.2 12.4* 11.0* 10.8* 12.7*  NEUTROABS  --   --   --  8.3* 9.9*  HGB 11.7* 12.1* 10.8* 10.2* 10.4*  HCT 33.5* 34.7* 30.9* 30.0* 30.2*  MCV 108.1* 108.8* 108.0* 109.9* 109.8*  PLT 50* 51* 41* 35* 38*   Basic Metabolic Panel: Recent Labs  Lab 01/06/20 0530 01/07/20 0424 01/08/20 0545 01/09/20 0516 01/10/20 0538  NA 135 131* 135 138 137  K 4.0 4.6 4.2 4.2 4.4  CL 101 99 102 102 97*  CO2 25 24 24 25 29   GLUCOSE 105* 110* 106* 110* 117*  BUN 28* 36* 40* 38* 38*  CREATININE 1.19 1.36* 1.17 1.09 1.18  CALCIUM 7.9* 7.8* 8.5* 8.6* 8.8*   GFR: Estimated Creatinine Clearance: 83 mL/min (by C-G formula based on SCr of 1.18 mg/dL). Liver Function Tests: Recent Labs  Lab  01/06/20 0530 01/07/20 0424 01/08/20 0545 01/09/20 0516 01/10/20 0538  AST 123* 142* 141* 148* 168*  ALT 85* 106* 112* 120* 142*  ALKPHOS 130* 135* 118 119 150*  BILITOT 28.4* 28.5* 28.6* 30.1* 29.9*  PROT 6.4* 6.3* 6.8 6.2* 6.3*  ALBUMIN 1.8* 1.8* 2.6* 2.7* 2.4*   No results for  input(s): LIPASE, AMYLASE in the last 168 hours. Recent Labs  Lab 01/04/20 0458 01/05/20 0626 01/06/20 0530  AMMONIA 107* 122* 99*   Coagulation Profile: Recent Labs  Lab 01/05/20 0626 01/06/20 0530 01/08/20 0917 01/09/20 0516 01/10/20 0538  INR 2.7* 2.7* 2.8* 2.8* 2.8*   Cardiac Enzymes: No results for input(s): CKTOTAL, CKMB, CKMBINDEX, TROPONINI in the last 168 hours. BNP (last 3 results) No results for input(s): PROBNP in the last 8760 hours. HbA1C: No results for input(s): HGBA1C in the last 72 hours. CBG: No results for input(s): GLUCAP in the last 168 hours. Lipid Profile: No results for input(s): CHOL, HDL, LDLCALC, TRIG, CHOLHDL, LDLDIRECT in the last 72 hours. Thyroid Function Tests: No results for input(s): TSH, T4TOTAL, FREET4, T3FREE, THYROIDAB in the last 72 hours. Anemia Panel: No results for input(s): VITAMINB12, FOLATE, FERRITIN, TIBC, IRON, RETICCTPCT in the last 72 hours. Sepsis Labs: No results for input(s): PROCALCITON, LATICACIDVEN in the last 168 hours.  Recent Results (from the past 240 hour(s))  Respiratory Panel by RT PCR (Flu A&B, Covid) - Nasopharyngeal Swab     Status: None   Collection Time: 01/01/20  2:07 PM   Specimen: Nasopharyngeal Swab  Result Value Ref Range Status   SARS Coronavirus 2 by RT PCR NEGATIVE NEGATIVE Final    Comment: (NOTE) SARS-CoV-2 target nucleic acids are NOT DETECTED. The SARS-CoV-2 RNA is generally detectable in upper respiratoy specimens during the acute phase of infection. The lowest concentration of SARS-CoV-2 viral copies this assay can detect is 131 copies/mL. A negative result does not preclude SARS-Cov-2 infection and  should not be used as the sole basis for treatment or other patient management decisions. A negative result may occur with  improper specimen collection/handling, submission of specimen other than nasopharyngeal swab, presence of viral mutation(s) within the areas targeted by this assay, and inadequate number of viral copies (<131 copies/mL). A negative result must be combined with clinical observations, patient history, and epidemiological information. The expected result is Negative. Fact Sheet for Patients:  PinkCheek.be Fact Sheet for Healthcare Providers:  GravelBags.it This test is not yet ap proved or cleared by the [place] FDA and  has been authorized for detection and/or diagnosis of SARS-CoV-2 by FDA under an Emergency Use Authorization (EUA). This EUA will remain  in effect (meaning this test can be used) for the duration of the COVID-19 declaration under Section 564(b)(1) of the Act, 21 U.S.C. section 360bbb-3(b)(1), unless the authorization is terminated or revoked sooner.    Influenza A by PCR NEGATIVE NEGATIVE Final   Influenza B by PCR NEGATIVE NEGATIVE Final    Comment: (NOTE) The Xpert Xpress SARS-CoV-2/FLU/RSV assay is intended as an aid in  the diagnosis of influenza from Nasopharyngeal swab specimens and  should not be used as a sole basis for treatment. Nasal washings and  aspirates are unacceptable for Xpert Xpress SARS-CoV-2/FLU/RSV  testing. Fact Sheet for Patients: PinkCheek.be Fact Sheet for Healthcare Providers: GravelBags.it This test is not yet approved or cleared by the [place] FDA and  has been authorized for detection and/or diagnosis of SARS-CoV-2 by  FDA under an Emergency Use Authorization (EUA). This EUA will remain  in effect (meaning this test can be used) for the duration of the  Covid-19 declaration under Section  564(b)(1) of the Act, 21  U.S.C. section 360bbb-3(b)(1), unless the authorization is  terminated or revoked. Performed at [facility], [place] [address] [place], [place] [address]   Culture, body fluid-bottle  Status: None   Collection Time: 01/02/20 12:36 PM   Specimen: Fluid  Result Value Ref Range Status   Specimen Description FLUID PERITONEAL  Final   Special Requests BOTTLES DRAWN AEROBIC AND ANAEROBIC 10CC  Final   Culture   Final    NO GROWTH 5 DAYS Performed at [facility], [place] [address], [place], [place] [address]    Report Status 01/07/2020 FINAL  Final  Gram stain     Status: None   Collection Time: 01/02/20 12:36 PM   Specimen: Fluid  Result Value Ref Range Status   Specimen Description FLUID PERITONEAL  Final   Special Requests NONE  Final   Gram Stain   Final    FEW WBC PRESENT, PREDOMINANTLY MONONUCLEAR NO ORGANISMS SEEN Performed at [facility], [place] [address] [place],  [address]    Report Status 01/02/2020 FINAL  Final      Radiology Studies: No results found.   Scheduled Meds: . feeding supplement (ENSURE ENLIVE)  237 mL Oral BID BM  . folic acid  1 mg Oral Daily  . furosemide  40 mg Oral Daily  . lactulose  20 g Oral BID  . multivitamin with minerals  1 tablet Oral Daily  . pantoprazole  40 mg Oral BID AC  . prednisoLONE  40 mg Oral Daily  . rifaximin  550 mg Oral BID  . simethicone  80 mg Oral BID  . spironolactone  100 mg Oral Daily  . thiamine  100 mg Oral Daily   Continuous Infusions:   LOS: 9 days    Time spent: 25 minutes spent in the coordination of care today.   Jonnie Finner, DO [facility]  If 7PM-7AM, please contact night-coverage [email or web address] 01/10/2020, 5:31 PM

## 2020-01-10 NOTE — Progress Notes (Addendum)
[name], male   DOB: July 01, 1966, 54 y.o.   MRN: [id number]    Progress Note   Subjective   day # 9 CC; severe alcoholic hepatitis and decompensated EtOH induced cirrhosis  Day #9 prednisolone  INR 2.8 Creatinine 1.18 T bili 29.9/alk phos 150/AST 168/ALT 142 WBC 12.7, hemoglobin 10.4, platelets 38  Up in chair, says he is feeling a little bit better trying to get up and walk with walker but says he is very weak and will have dizziness with standing  Upper abdominal pain about the same  Additional review of systems.  Denies chest pain or dyspnea.  He is fatigued and his legs feel weak with brief ambulation with the aid of a walker. Feels that his head is "clearing".   Objective   Vital signs in last 24 hours: Temp:  [97.7 F (36.5 C)-98.1 F (36.7 C)] 98.1 F (36.7 C) (05/13 0545) Pulse Rate:  [92-95] 95 (05/13 0545) Resp:  [18-20] 20 (05/13 0545) BP: (102-143)/(62-70) 102/62 (05/13 0545) SpO2:  [95 %-99 %] 99 % (05/13 0545) Last BM Date: 01/08/20 General:    [name] in NAD-deeply icteric Heart:  Regular rate and rhythm; no murmurs Lungs: Respirations even and unlabored, lungs CTA bilaterally Abdomen:  Soft, nontender and nondistended. Normal bowel sounds. Extremities:  Without edema. Neurologic:  Alert and oriented,  grossly normal neurologically. Psych:  Cooperative. Normal mood and affect.  Intake/Output from previous day: 05/12 0701 - 05/13 0700 In: -  Out: 7829 [Urine:1650] Intake/Output this shift: No intake/output data recorded.  Lab Results: Recent Labs    01/08/20 0545 01/09/20 0516 01/10/20 0538  WBC 11.0* 10.8* 12.7*  HGB 10.8* 10.2* 10.4*  HCT 30.9* 30.0* 30.2*  PLT 41* 35* 38*   BMET Recent Labs    01/08/20 0545 01/09/20 0516 01/10/20 0538  NA 135 138 137  K 4.2 4.2 4.4  CL 102 102 97*  CO2 '24 25 29  '$ GLUCOSE 106* 110* 117*  BUN 40* 38* 38*  CREATININE 1.17 1.09 1.18  CALCIUM 8.5* 8.6* 8.8*   LFT Recent  Labs    01/10/20 0538  PROT 6.3*  ALBUMIN 2.4*  AST 168*  ALT 142*  ALKPHOS 150*  BILITOT 29.9*   PT/INR Recent Labs    01/09/20 0516 01/10/20 0538  LABPROT 28.6* 28.9*  INR 2.8* 2.8*    Studies/Results: DG Chest 2 View  Result Date: 01/08/2020 CLINICAL DATA:  Chest pain.  Pleural effusion. EXAM: CHEST - 2 VIEW COMPARISON:  05/23/2017; CT abdomen pelvis-01/01/2020 FINDINGS: Grossly unchanged borderline enlarged cardiac silhouette and mediastinal contours with mild tortuosity and potential ectasia of the thoracic aorta. Grossly unchanged minimal left basilar/retrocardiac heterogeneous opacities favored to represent atelectasis. No new focal airspace opacities. No pleural effusion or pneumothorax. No evidence of edema. No acute osseous abnormalities. Stigmata of dish within the thoracic spine. IMPRESSION: Similar findings of borderline cardiomegaly and left basilar atelectasis/scar without superimposed acute cardiopulmonary disease. Specifically, no evidence of pleural effusion. Electronically Signed   By: Sandi Mariscal M.D.   On: 01/08/2020 17:12   US Paracentesis  Result Date: 01/08/2020 INDICATION: Alcoholic hepatitis and decompensated cirrhosis. Recurrent ascites. Request for therapeutic paracentesis. EXAM: ULTRASOUND GUIDED RIGHT LOWER QUADRANT PARACENTESIS MEDICATIONS: None. COMPLICATIONS: None immediate. PROCEDURE: Informed written consent was obtained from the patient after a discussion of the risks, benefits and alternatives to treatment. A timeout was performed prior to the initiation of the procedure. Initial ultrasound scanning demonstrates a small to moderate amount of ascites  within the right lower abdominal quadrant. The right lower abdomen was prepped and draped in the usual sterile fashion. 1% lidocaine was used for local anesthesia. Following this, a 19 gauge, 10-cm, Yueh catheter was introduced. An ultrasound image was saved for documentation purposes. The paracentesis was  performed. The catheter was removed and a dressing was applied. The patient tolerated the procedure well without immediate post procedural complication. FINDINGS: A total of approximately 1.2 L of clear, bright yellow fluid was removed. IMPRESSION: Successful ultrasound-guided paracentesis yielding 1.2 liters of peritoneal fluid. Read by: Ascencion Dike PA-C Electronically Signed   By: Sandi Mariscal M.D.   On: 01/08/2020 16:11       Assessment / Plan:    #42 54 year old [name] with severe alcoholic hepatitis and decompensated cirrhosis with no improvement in parameters day 8 on prednisolone  M ELD 32 discriminant function 91.6  Continuing prednisolone for now while inpatient as have little else to offer him.  #2 hepatic encephalopathy-stable, continue current dose of lactulose and Xifaxan 550 twice daily #3 anasarca-have started diuretics Lasix 40 mg daily and Aldactone 100 mg p.o. daily, renal function stable  #4 persistent complaints of heartburn and postprandial epigastric pain despite PPI, Maalox etc. Patient has not had screening EGD  Plan; patient was tentatively scheduled for EGD today, this has been rescheduled for tomorrow morning. Continue current regimen as outlined above. Hopefully he will be able to be discharged in the next 24 to 48 hours, but will need close outpatient monitoring for further decompensation.    Active Problems:   Cirrhosis ([place])   Alcoholic hepatitis with ascites   Coagulopathy (HCC)   Portal hypertension (HCC)   Encephalopathy, hepatic (HCC)   Heartburn     LOS: 9 days   [name]  01/10/2020, 10:45 AM   I have discussed the case with the PA, and that is the plan I formulated. I personally interviewed and examined the patient.  He is more alert in the last couple of days since the addition of lactulose to the rifaximin. He remains deeply jaundiced and icteric, which I expect will persist for at least weeks. Upper endoscopy is planned  for tomorrow for variceal screening, and also to investigate patient's complaints of epigastric discomfort and heartburn.  If varices are found, it would help with prognosis, especially if the patient presents at some point with upper GI bleeding.  If varices are found tomorrow, I would not advocate starting nonselective beta-blocker given his decompensated disease and demonstrated renal sensitivity thus far.  I believe we have done all we can for him in the inpatient setting.  He will remain a very sick man for weeks to perhaps months to come.  As I had previously noted, what we see right now might be his liver function for the foreseeable future.  He can most likely be discharged from the hospital tomorrow after his upper endoscopy if the internal medicine service is agreeable.  We would arrange as close follow-up as possible with me, which unfortunately may be limited by the patient's lack of insurance and resources.  At the time of discharge, I would keep him on the current doses of diuretics and rifaximin and lactulose. Keep the current dose of prednisolone for total of 20 days, then decrease to 30 mg a day for 4 days, 20 mg a day for 4 days, 10 mg a day for 4 days and stop.  Total time 35 minutes  Nelida Meuse III Office: [phone]

## 2020-01-11 ENCOUNTER — Encounter (HOSPITAL_COMMUNITY)
Admission: EM | Disposition: A | Discharge status: Home / Self Care | Payer: Self-pay | Source: Home / Self Care | Attending: Internal Medicine

## 2020-01-11 ENCOUNTER — Inpatient Hospital Stay (HOSPITAL_COMMUNITY): Payer: Self-pay | Admitting: Certified Registered"

## 2020-01-11 ENCOUNTER — Telehealth: Payer: Self-pay

## 2020-01-11 ENCOUNTER — Other Ambulatory Visit: Payer: Self-pay

## 2020-01-11 ENCOUNTER — Encounter (HOSPITAL_COMMUNITY): Payer: Self-pay | Admitting: Osteopathic Medicine

## 2020-01-11 DIAGNOSIS — R1013 Epigastric pain: Secondary | ICD-10-CM

## 2020-01-11 DIAGNOSIS — K7031 Alcoholic cirrhosis of liver with ascites: Secondary | ICD-10-CM

## 2020-01-11 DIAGNOSIS — I8510 Secondary esophageal varices without bleeding: Secondary | ICD-10-CM

## 2020-01-11 DIAGNOSIS — K3189 Other diseases of stomach and duodenum: Secondary | ICD-10-CM

## 2020-01-11 HISTORY — PX: ESOPHAGOGASTRODUODENOSCOPY (EGD) WITH PROPOFOL: SHX5813

## 2020-01-11 HISTORY — PX: BIOPSY: SHX5522

## 2020-01-11 LAB — COMPREHENSIVE METABOLIC PANEL
ALT: 174 U/L — ABNORMAL HIGH (ref 0–44)
AST: 192 U/L — ABNORMAL HIGH (ref 15–41)
Albumin: 2.5 g/dL — ABNORMAL LOW (ref 3.5–5.0)
Alkaline Phosphatase: 166 U/L — ABNORMAL HIGH (ref 38–126)
Anion gap: 10 (ref 5–15)
BUN: 38 mg/dL — ABNORMAL HIGH (ref 6–20)
CO2: 27 mmol/L (ref 22–32)
Calcium: 8.6 mg/dL — ABNORMAL LOW (ref 8.9–10.3)
Chloride: 96 mmol/L — ABNORMAL LOW (ref 98–111)
Creatinine, Ser: 1.06 mg/dL (ref 0.61–1.24)
GFR calc Af Amer: >60 mL/min (ref >60)
GFR calc non Af Amer: >60 mL/min (ref >60)
Glucose, Bld: 110 mg/dL — ABNORMAL HIGH (ref 70–99)
Potassium: 5 mmol/L (ref 3.5–5.1)
Sodium: 133 mmol/L — ABNORMAL LOW (ref 135–145)
Total Bilirubin: 26.8 mg/dL (ref 0.3–1.2)
Total Protein: 6.5 g/dL (ref 6.5–8.1)

## 2020-01-11 LAB — CBC WITH DIFFERENTIAL/PLATELET
Abs Immature Granulocytes: 0.61 10*3/uL — ABNORMAL HIGH (ref 0.00–0.07)
Basophils Absolute: 0.1 10*3/uL (ref 0.0–0.1)
Basophils Relative: 0 %
Eosinophils Absolute: 0 10*3/uL (ref 0.0–0.5)
Eosinophils Relative: 0 %
HCT: 33.3 % — ABNORMAL LOW (ref 39.0–52.0)
Hemoglobin: 11.4 g/dL — ABNORMAL LOW (ref 13.0–17.0)
Immature Granulocytes: 4 %
Lymphocytes Relative: 3 %
Lymphs Abs: 0.5 10*3/uL — ABNORMAL LOW (ref 0.7–4.0)
MCH: 38 pg — ABNORMAL HIGH (ref 26.0–34.0)
MCHC: 34.2 g/dL (ref 30.0–36.0)
MCV: 111 fL — ABNORMAL HIGH (ref 80.0–100.0)
Monocytes Absolute: 1.6 10*3/uL — ABNORMAL HIGH (ref 0.1–1.0)
Monocytes Relative: 10 %
Neutro Abs: 14.1 10*3/uL — ABNORMAL HIGH (ref 1.7–7.7)
Neutrophils Relative %: 83 %
Platelets: 40 10*3/uL — ABNORMAL LOW (ref 150–400)
RBC: 3 MIL/uL — ABNORMAL LOW (ref 4.22–5.81)
RDW: 17.4 % — ABNORMAL HIGH (ref 11.5–15.5)
WBC: 17 10*3/uL — ABNORMAL HIGH (ref 4.0–10.5)
nRBC: 0 % (ref 0.0–0.2)

## 2020-01-11 LAB — PROTIME-INR
INR: 2.5 — ABNORMAL HIGH (ref 0.8–1.2)
Prothrombin Time: 26 seconds — ABNORMAL HIGH (ref 11.4–15.2)

## 2020-01-11 SURGERY — ESOPHAGOGASTRODUODENOSCOPY (EGD) WITH PROPOFOL
Anesthesia: Monitor Anesthesia Care

## 2020-01-11 MED ORDER — PROPOFOL 500 MG/50ML IV EMUL
INTRAVENOUS | Status: DC | PRN
  Administered 2020-01-11: 125 ug/kg/min via INTRAVENOUS

## 2020-01-11 MED ORDER — RIFAXIMIN 550 MG PO TABS: 550.0000 mg | ORAL_TABLET | Freq: Two times a day (BID) | ORAL | 0 refills | Status: AC

## 2020-01-11 MED ORDER — SPIRONOLACTONE 100 MG PO TABS: 100.0000 mg | ORAL_TABLET | Freq: Every day | ORAL | 0 refills | Status: DC

## 2020-01-11 MED ORDER — LACTULOSE 20 G PO PACK: 20.0000 g | PACK | Freq: Two times a day (BID) | ORAL | 0 refills | Status: DC

## 2020-01-11 MED ORDER — LACTATED RINGERS IV SOLN: INTRAVENOUS | Status: DC

## 2020-01-11 MED ORDER — THIAMINE HCL 100 MG PO TABS: 100.0000 mg | ORAL_TABLET | Freq: Every day | ORAL | 0 refills | Status: AC

## 2020-01-11 MED ORDER — PREDNISOLONE 5 MG PO TABS: 40.0000 mg | ORAL_TABLET | Freq: Every day | ORAL | 0 refills | Status: DC

## 2020-01-11 MED ORDER — PROPOFOL 10 MG/ML IV BOLUS
INTRAVENOUS | Status: DC | PRN
  Administered 2020-01-11: 20 mg via INTRAVENOUS

## 2020-01-11 MED ORDER — PREDNISOLONE 5 MG PO TABS: 10.0000 mg | ORAL_TABLET | Freq: Every day | ORAL | 0 refills | Status: DC

## 2020-01-11 MED ORDER — LIDOCAINE 2% (20 MG/ML) 5 ML SYRINGE
INTRAMUSCULAR | Status: DC | PRN
  Administered 2020-01-11: 40 mg via INTRAVENOUS

## 2020-01-11 MED ORDER — FUROSEMIDE 40 MG PO TABS: 40.0000 mg | ORAL_TABLET | Freq: Every day | ORAL | 0 refills | Status: DC

## 2020-01-11 MED ORDER — PROPOFOL 10 MG/ML IV BOLUS
INTRAVENOUS | Status: AC
  Filled 2020-01-11: qty 20

## 2020-01-11 MED ORDER — PANTOPRAZOLE SODIUM 40 MG PO TBEC: 40.0000 mg | DELAYED_RELEASE_TABLET | Freq: Two times a day (BID) | ORAL | 0 refills | Status: AC

## 2020-01-11 SURGICAL SUPPLY — 15 items

## 2020-01-11 NOTE — Discharge Summary (Addendum)
. Physician Discharge Summary  Jesse Esparza [id number] DOB: [birth year] DOA: 01/01/2020  PCP: Charlott Rakes, MD  Admit date: 01/01/2020 Discharge date: 01/11/2020  Admitted From: Home Disposition:  Discharged to home.   Recommendations for Outpatient Follow-up:  1. Follow up with [facility] on 01/17/20 at 1510 hrs.  2. Please obtain BMP/CBC in one week 3. Follow up with GI. They will call with appt time.  Discharge Condition: Stable  CODE STATUS: FULL   Brief/Interim Summary: 54 year old male with history of known cirrhosis, GERD, hyperlipidemia, hypertension, obesity presented with 3 weeks of worsening fatigue, nausea, decreased appetite. Also reported lower extremity edema, worsening abdominal distentionand jaundice. Patient reportedconsuming 4 to 6 cans of beers per day for years and stopped 2 weeks ago.  ED Course:Labs with transaminitis, elevated bili, hypoalbuminemia, hypercalcemia, elevated INR, elevated ammonia, macrocytic anemia.CT showed cirrhotic liver with splenomegaly and spontaneous splenorenal shunt, moderate ascites with cholelithiasis. Hospital course: Patient admitted to [facility] forfurther evaluation and management of acute alcoholic hepatitis with GI consultation. Patient underwent paracentesis on 5/5 with drainage of 1.4 lits yellow ascitic fluid.Echo showed GRADE 1 diastolic dysfunction with EF 70-75%.  5/13: Seen walking the hallways in good spirits. Liver numbers are not improving. Steroids have not helped. He will continue them while inpatient, however. EGD is scheduled for tomorrow. Hopeful d/c to home tomorrow after procedure. Will need PCP and GI follow up  5/14: Renal function is stable and this is about the best as he liver is going to get. S/p EGD today. He can follow up with GI (spoke with PA, they will set up appointment). He has a hospital follow up with [facility]. Rx written and [facility] will match. He is  in an unfortunate situation long term. He needs insurance coverage. He needs a new liver. Discussed with him abstinence for a minimum of 6 months before he'd be considered for a transplant. And even then, it's not a quite process. He voiced understanding. He family member on the phone voiced understanding as well. He is ok for discharge today.    Discharge Diagnoses:  Active Problems:   Cirrhosis ([place])   Alcoholic hepatitis with ascites   Coagulopathy (HCC)   Portal hypertension (HCC)   Encephalopathy, hepatic (HCC)   Heartburn   Abdominal pain, epigastric  Acute alcoholic hepatitis Liver cirrhosis with ascites Hepatic encephalopathy Coagulopathy secondary to cirrhosis - GI onboard, appreciate assistance.  - MELD score is 32 - On day 8 of steroids  - Autoimmune labs including IgG and SMAb are elevated. - ANA: "Due to a lack of reproducibility with this patient sample, a valid result could not be obtained." - he is s/p paracentisis w/o evidence of SBP - not a liver transplant candidate d/t ongoing alcohol abuse  - 5/12: continuing steroids today. T bili is up to 30 today and his INR remains elevated - this is Day 2/3 of vitamin K IV; Hgb is stable - renal fxn is ok; continuing lasix; GI to increase aldactone to 200mg  qday - continue lactulose, xifaxan, thiamine, protonix - has PRN maalox, add simethicone for gas     - 5/13: He tells me that simethicone has helped some with his gas, but he is still having problems. To have EGD tomorrow.      - continuing steroids while inpatient.      - 5/14: Completed vit K. S/p EGD. Can follow up with GI outpt. Liver fxn is stable, but poor. He is stable for  discharge at this point. Will need follow up with PCP and GI.   Epistaxis in the setting of thrombocytopenia secondary to cirrhosis with splenomegaly.  - 5/12: denies episodes today, monitor     - 5/13: No further episode,  monitor.  Macrocytic anemia Thrombocytopenia - d/t liver issue, EtOH abuse - plts 35 today; monitor     - 5/13: plts stable at 38     - 5/14: plts improving to 40.   Intermittent UGIB -Patient reported history of black solid stool and intermittent bright red blood over the past 2 months. - FOBT positive - 5/12: possible EGD tomorrow per GI     - 5/13: EGD tomorrow.     - 5/14: s/p EGD. Will continue protonix BID at discharge.   Acute kidney injury:  - Patient's baseline creatinine around 0.7 last week.  - Slowly up trended and up to 1.36 - 5/12: SCr is 1.09; UOP ok, tolerating diuretics, monitor     - 5/13: SCr is slightly up at 1.18. Continue diuretics. UOP ok.     - 5/14: SCr is stable. Tolerating diuretics. Continue.  Hypokalemia Hyponatremia - resolved, monitor  Morbid obesity - Estimated body mass index is 42.28 kg/m - deit, exercise counseling  Discharge Instructions   Allergies as of 01/11/2020   No Known Allergies     Medication List    TAKE these medications   furosemide 40 MG tablet Commonly known as: LASIX Take 1 tablet (40 mg total) by mouth daily. Start taking on: Jan 12, 2020   lactulose 20 g packet Commonly known as: CEPHULAC Take 1 packet (20 g total) by mouth 2 (two) times daily.   pantoprazole 40 MG tablet Commonly known as: PROTONIX Take 1 tablet (40 mg total) by mouth 2 (two) times daily before a meal.   prednisoLONE 5 MG Tabs tablet Take 8 tablets (40 mg total) by mouth daily for 20 days.   rifaximin 550 MG Tabs tablet Commonly known as: XIFAXAN Take 1 tablet (550 mg total) by mouth 2 (two) times daily.   simethicone 125 MG chewable tablet Commonly known as: MYLICON Chew 0000000 mg by mouth every 6 (six) hours as needed for flatulence.   spironolactone 100 MG tablet Commonly known as: ALDACTONE Take 1 tablet (100 mg total) by mouth daily. Start taking on: Jan 12, 2020   thiamine  100 MG tablet Take 1 tablet (100 mg total) by mouth daily. Start taking on: Jan 12, 2020      Follow-up Information    [facility] Follow up.   Specialty: Professional Counselor Why: call for outpatient treatment services. They have walkin hours Monday - Friday: 8:30 a.m.-12 p.m. / 1 p.m.-2:30 p.m. Contact information: [employer] [facility] [place] [address] [phone]        [facility]. Go on 01/17/2020.   Why: Hospital follow up appointment at 3:10pm. Contact information: [address] [place] [phone]         No Known Allergies  Consultations:  GI   Procedures/Studies: DG Chest 2 View  Result Date: 01/08/2020 CLINICAL DATA:  Chest pain.  Pleural effusion. EXAM: CHEST - 2 VIEW COMPARISON:  05/23/2017; CT abdomen pelvis-01/01/2020 FINDINGS: Grossly unchanged borderline enlarged cardiac silhouette and mediastinal contours with mild tortuosity and potential ectasia of the thoracic aorta. Grossly unchanged minimal left basilar/retrocardiac heterogeneous opacities favored to represent atelectasis. No new focal airspace opacities. No  pleural effusion or pneumothorax. No evidence of edema. No acute osseous abnormalities. Stigmata of dish within the thoracic spine. IMPRESSION: Similar findings of borderline cardiomegaly and left basilar atelectasis/scar without superimposed acute cardiopulmonary disease. Specifically, no evidence of pleural effusion. Electronically Signed   By: Sandi Mariscal M.D.   On: 01/08/2020 17:12   CT ABDOMEN PELVIS W CONTRAST  Result Date: 01/01/2020 CLINICAL DATA:  Cirrhosis, weight gain, abdominal distension, leg swelling, jaundice, history GERD, hypertension EXAM: CT ABDOMEN AND PELVIS WITH CONTRAST TECHNIQUE: Multidetector CT imaging of the abdomen and pelvis was performed using the standard protocol following bolus  administration of intravenous contrast. Sagittal and coronal MPR images reconstructed from axial data set. CONTRAST:  179mL OMNIPAQUE IOHEXOL 300 MG/ML SOLN IV. No oral contrast. COMPARISON:  04/05/2016 FINDINGS: Lower chest: Subsegmental atelectasis RIGHT lower lobe. Tiny RIGHT pleural effusion. Hepatobiliary: Dependent calculi within gallbladder. Gallbladder wall appears thickened, nonspecific in the setting of ascites. Heterogeneous cirrhotic appearing liver without discrete mass. No intrahepatic or extrahepatic biliary dilatation. Pancreas: Normal appearance Spleen: Mildly enlarged, 14.6 x 13.6 x 5.3 cm (volume = 550 cm^3). No focal mass. Adrenals/Urinary Tract: Adrenal glands, kidneys, ureters, and bladder normal appearance Stomach/Bowel: Normal appendix. Stomach and bowel loops normal appearance Vascular/Lymphatic: Aorta normal caliber with mild scattered atherosclerotic calcifications. Vascular structures patent. Large perisplenic collateral extending to LEFT renal vein consistent with spontaneous splenorenal shunt. Major vascular structures patent. No adenopathy. Reproductive: Unremarkable seminal vesicles. Prostate gland minimally enlarged at 4.8 x 3.5 cm image 96. Other: Moderate ascites. No free air. No hernia. Scattered edema/infiltration of tissue planes in abdomen. 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 Moderate ascites. Cholelithiasis. Minimal prostatic enlargement. Electronically Signed   By: Lavonia Dana M.D.   On: 01/01/2020 12:54   US Paracentesis  Result Date: 01/08/2020 INDICATION: Alcoholic hepatitis and decompensated cirrhosis. Recurrent ascites. Request for therapeutic paracentesis. EXAM: ULTRASOUND GUIDED RIGHT LOWER QUADRANT PARACENTESIS MEDICATIONS: None. COMPLICATIONS: None immediate. PROCEDURE: Informed written consent was obtained from the patient after a discussion of the risks, benefits and alternatives to  treatment. A timeout was performed prior to the initiation of the procedure. Initial ultrasound scanning demonstrates a small to moderate amount of ascites within the right lower abdominal quadrant. The right lower abdomen was prepped and draped in the usual sterile fashion. 1% lidocaine was used for local anesthesia. Following this, a 19 gauge, 10-cm, Yueh catheter was introduced. An ultrasound image was saved for documentation purposes. The paracentesis was performed. The catheter was removed and a dressing was applied. The patient tolerated the procedure well without immediate post procedural complication. FINDINGS: A total of approximately 1.2 L of clear, bright yellow fluid was removed. IMPRESSION: Successful ultrasound-guided paracentesis yielding 1.2 liters of peritoneal fluid. Read by: Ascencion Dike PA-C Electronically Signed   By: Sandi Mariscal M.D.   On: 01/08/2020 16:11   US Paracentesis  Result Date: 01/02/2020 INDICATION: Patient with history of acute alcoholic hepatitis/chronic alcoholic cirrhosis, ascites; request received for diagnostic and therapeutic paracentesis. EXAM: ULTRASOUND GUIDED DIAGNOSTIC AND THERAPEUTIC PARACENTESIS MEDICATIONS: None COMPLICATIONS: None immediate. PROCEDURE: Informed written consent was obtained from the patient after a discussion of the risks, benefits and alternatives to treatment. A timeout was performed prior to the initiation of the procedure. Initial ultrasound scanning demonstrates a small amount of ascites within the right lower abdominal quadrant. The right lower abdomen was prepped and draped in the usual sterile fashion. 1% lidocaine was used for local anesthesia. Following this, a 19 gauge, 10-cm, Teressa Lower  catheter was introduced. An ultrasound image was saved for documentation purposes. The paracentesis was performed. The catheter was removed and a dressing was applied. The patient tolerated the procedure well without immediate post procedural complication.  FINDINGS: A total of approximately 1.4 liters of clear, golden yellow fluid was removed. Samples were sent to the laboratory as requested by the clinical team. IMPRESSION: Successful ultrasound-guided diagnostic and therapeutic paracentesis yielding 1.4 liters of peritoneal fluid. Read by: Rowe Robert, PA-C Electronically Signed   By: Corrie Mckusick D.O.   On: 01/02/2020 12:44   ECHOCARDIOGRAM COMPLETE  Result Date: 01/02/2020    ECHOCARDIOGRAM REPORT   Patient Name:   Jesse Esparza Date of Exam: 01/02/2020 Medical Rec #:  [id number]       Height:       65.0 in Accession #:    [id number]      Weight:       254.1 lb Date of Birth:  1966-02-23      BSA:          2.190 m Patient Age:    7 years        BP:           111/67 mmHg Patient Gender: M               HR:           93 bpm. Exam Location:  Inpatient Procedure: 2D Echo, Cardiac Doppler and Color Doppler Indications:    R94.31 Abnormal EKG  History:        Patient has no prior history of Echocardiogram examinations.                 Risk Factors:Hypertension. ETOH. Edema. Ascites.  Sonographer:    Roseanna Rainbow RDCS Referring Phys: [id number] Ava Comments: Suboptimal parasternal window, no subcostal window, Technically difficult study due to poor echo windows and patient is morbidly obese. Image acquisition challenging due to patient body habitus. Study is to rule out right heart failure. IMPRESSIONS  1. Left ventricular ejection fraction, by estimation, is 70 to 75%. The left ventricle has hyperdynamic function. The left ventricle has no regional wall motion abnormalities. There is moderate left ventricular hypertrophy. Left ventricular diastolic parameters are consistent with Grade I diastolic dysfunction (impaired relaxation).  2. Right ventricular systolic function is normal. The right ventricular size is normal.  3. The mitral valve is normal in structure. No evidence of mitral valve regurgitation. No evidence of mitral stenosis.   4. The aortic valve was not well visualized. Aortic valve regurgitation is not visualized. No aortic stenosis is present.  5. The inferior vena cava is normal in size with greater than 50% respiratory variability, suggesting right atrial pressure of 3 mmHg. FINDINGS  Left Ventricle: Left ventricular ejection fraction, by estimation, is 70 to 75%. The left ventricle has hyperdynamic function. The left ventricle has no regional wall motion abnormalities. The left ventricular internal cavity size was normal in size. There is moderate left ventricular hypertrophy. Left ventricular diastolic parameters are consistent with Grade I diastolic dysfunction (impaired relaxation). Right Ventricle: The right ventricular size is normal. No increase in right ventricular wall thickness. Right ventricular systolic function is normal. Left Atrium: Left atrial size was normal in size. Right Atrium: Right atrial size was normal in size. Pericardium: There is no evidence of pericardial effusion. Mitral Valve: The mitral valve is normal in structure. Normal mobility of the mitral valve leaflets. No evidence of mitral valve regurgitation. No  evidence of mitral valve stenosis. Tricuspid Valve: The tricuspid valve is normal in structure. Tricuspid valve regurgitation is not demonstrated. No evidence of tricuspid stenosis. Aortic Valve: The aortic valve was not well visualized. Aortic valve regurgitation is not visualized. No aortic stenosis is present. Pulmonic Valve: The pulmonic valve was normal in structure. Pulmonic valve regurgitation is not visualized. No evidence of pulmonic stenosis. Aorta: The aortic root is normal in size and structure. Venous: The inferior vena cava is normal in size with greater than 50% respiratory variability, suggesting right atrial pressure of 3 mmHg. IAS/Shunts: No atrial level shunt detected by color flow Doppler.  LEFT VENTRICLE PLAX 2D LVIDd:         4.08 cm     Diastology LVIDs:         2.54 cm     LV  e' lateral:   9.79 cm/s LV PW:         1.46 cm     LV E/e' lateral: 9.0 LV IVS:        1.33 cm     LV e' medial:    7.40 cm/s LVOT diam:     2.10 cm     LV E/e' medial:  11.9 LV SV:         80 LV SV Index:   37 LVOT Area:     3.46 cm  LV Volumes (MOD) LV vol d, MOD A2C: 48.6 ml LV vol d, MOD A4C: 68.5 ml LV vol s, MOD A2C: 11.8 ml LV vol s, MOD A4C: 17.0 ml LV SV MOD A2C:     36.8 ml LV SV MOD A4C:     68.5 ml LV SV MOD BP:      45.2 ml RIGHT VENTRICLE RV S prime:     13.40 cm/s TAPSE (M-mode): 2.0 cm LEFT ATRIUM             Index       RIGHT ATRIUM           Index LA diam:        3.70 cm 1.69 cm/m  RA Area:     12.50 cm LA Vol (A2C):   31.2 ml 14.25 ml/m RA Volume:   20.40 ml  9.32 ml/m LA Vol (A4C):   46.5 ml 21.23 ml/m LA Biplane Vol: 40.8 ml 18.63 ml/m  AORTIC VALVE LVOT Vmax:   134.00 cm/s LVOT Vmean:  96.000 cm/s LVOT VTI:    0.231 m  AORTA Ao Root diam: 3.30 cm Ao Asc diam:  3.20 cm MITRAL VALVE MV Area (PHT): 3.31 cm     SHUNTS MV Decel Time: 229 msec     Systemic VTI:  0.23 m MV E velocity: 87.80 cm/s   Systemic Diam: 2.10 cm MV A velocity: 123.00 cm/s MV E/A ratio:  0.71 Candee Furbish MD Electronically signed by Candee Furbish MD Signature Date/Time: 01/02/2020/11:01:13 AM    Final       Subjective: "How do I get on the list?"  Discharge Exam: Vitals:   01/11/20 1145 01/11/20 1227  BP: 140/70 135/61  Pulse: 81 80  Resp: (!) 22 15  Temp:  97.6 F (36.4 C)  SpO2: 99% 96%   Vitals:   01/11/20 1115 01/11/20 1130 01/11/20 1145 01/11/20 1227  BP: (!) 122/59 127/68 140/70 135/61  Pulse: 77 75 81 80  Resp: 19 18 (!) 22 15  Temp: (!) 97.3 F (36.3 C)   97.6 F (36.4 C)  TempSrc: Axillary  Oral  SpO2: 100% 100% 99% 96%  Weight:      Height:        General: 54 y.o. male resting in chair in NAD Cardiovascular: RRR, +S1, S2, no m/g/r, equal pulses throughout Respiratory: CTABL, no w/r/r, normal WOB GI: BS+, hypogastric TTP mild, distended, no masses noted, no organomegaly  noted MSK: No c/c; BLE edema Neuro: A&O x 3, no focal deficits Psyc: Appropriate interaction and affect, calm/cooperative   The results of significant diagnostics from this hospitalization (including imaging, microbiology, ancillary and laboratory) are listed below for reference.     Microbiology: Recent Results (from the past 240 hour(s))  Respiratory Panel by RT PCR (Flu A&B, Covid) - Nasopharyngeal Swab     Status: None   Collection Time: 01/01/20  2:07 PM   Specimen: Nasopharyngeal Swab  Result Value Ref Range Status   SARS Coronavirus 2 by RT PCR NEGATIVE NEGATIVE Final    Comment: (NOTE) SARS-CoV-2 target nucleic acids are NOT DETECTED. The SARS-CoV-2 RNA is generally detectable in upper respiratoy specimens during the acute phase of infection. The lowest concentration of SARS-CoV-2 viral copies this assay can detect is 131 copies/mL. A negative result does not preclude SARS-Cov-2 infection and should not be used as the sole basis for treatment or other patient management decisions. A negative result may occur with  improper specimen collection/handling, submission of specimen other than nasopharyngeal swab, presence of viral mutation(s) within the areas targeted by this assay, and inadequate number of viral copies (<131 copies/mL). A negative result must be combined with clinical observations, patient history, and epidemiological information. The expected result is Negative. Fact Sheet for Patients:  PinkCheek.be Fact Sheet for Healthcare Providers:  GravelBags.it This test is not yet ap proved or cleared by the [place] FDA and  has been authorized for detection and/or diagnosis of SARS-CoV-2 by FDA under an Emergency Use Authorization (EUA). This EUA will remain  in effect (meaning this test can be used) for the duration of the COVID-19 declaration under Section 564(b)(1) of the Act, 21 U.S.C. section  360bbb-3(b)(1), unless the authorization is terminated or revoked sooner.    Influenza A by PCR NEGATIVE NEGATIVE Final   Influenza B by PCR NEGATIVE NEGATIVE Final    Comment: (NOTE) The Xpert Xpress SARS-CoV-2/FLU/RSV assay is intended as an aid in  the diagnosis of influenza from Nasopharyngeal swab specimens and  should not be used as a sole basis for treatment. Nasal washings and  aspirates are unacceptable for Xpert Xpress SARS-CoV-2/FLU/RSV  testing. Fact Sheet for Patients: PinkCheek.be Fact Sheet for Healthcare Providers: GravelBags.it This test is not yet approved or cleared by the [place] FDA and  has been authorized for detection and/or diagnosis of SARS-CoV-2 by  FDA under an Emergency Use Authorization (EUA). This EUA will remain  in effect (meaning this test can be used) for the duration of the  Covid-19 declaration under Section 564(b)(1) of the Act, 21  U.S.C. section 360bbb-3(b)(1), unless the authorization is  terminated or revoked. Performed at [facility], [place] [address], [place], [place] [address]   Culture, body fluid-bottle     Status: None   Collection Time: 01/02/20 12:36 PM   Specimen: Fluid  Result Value Ref Range Status   Specimen Description FLUID PERITONEAL  Final   Special Requests BOTTLES DRAWN AEROBIC AND ANAEROBIC 10CC  Final   Culture   Final    NO GROWTH 5 DAYS Performed at [facility], [place]  [address] [place], [place] [address]    Report Status 01/07/2020 FINAL  Final  Gram stain     Status: None   Collection Time: 01/02/20 12:36 PM   Specimen: Fluid  Result Value Ref Range Status   Specimen Description FLUID PERITONEAL  Final   Special Requests NONE  Final   Gram Stain   Final    FEW WBC PRESENT, PREDOMINANTLY MONONUCLEAR NO ORGANISMS SEEN Performed at [facility], [place] [address] [place], [place] [address]    Report Status 01/02/2020  FINAL  Final     Labs: BNP (last 3 results) No results for input(s): BNP in the last 8760 hours. Basic Metabolic Panel: Recent Labs  Lab 01/07/20 0424 01/08/20 0545 01/09/20 0516 01/10/20 0538 01/11/20 0535  NA 131* 135 138 137 133*  K 4.6 4.2 4.2 4.4 5.0  CL 99 102 102 97* 96*  CO2 24 24 25 29 27   GLUCOSE 110* 106* 110* 117* 110*  BUN 36* 40* 38* 38* 38*  CREATININE 1.36* 1.17 1.09 1.18 1.06  CALCIUM 7.8* 8.5* 8.6* 8.8* 8.6*   Liver Function Tests: Recent Labs  Lab 01/07/20 0424 01/08/20 0545 01/09/20 0516 01/10/20 0538 01/11/20 0535  AST 142* 141* 148* 168* 192*  ALT 106* 112* 120* 142* 174*  ALKPHOS 135* 118 119 150* 166*  BILITOT 28.5* 28.6* 30.1* 29.9* 26.8*  PROT 6.3* 6.8 6.2* 6.3* 6.5  ALBUMIN 1.8* 2.6* 2.7* 2.4* 2.5*   No results for input(s): LIPASE, AMYLASE in the last 168 hours. Recent Labs  Lab 01/05/20 0626 01/06/20 0530  AMMONIA 122* 99*   CBC: Recent Labs  Lab 01/07/20 0424 01/08/20 0545 01/09/20 0516 01/10/20 0538 01/11/20 0535  WBC 12.4* 11.0* 10.8* 12.7* 17.0*  NEUTROABS  --   --  8.3* 9.9* 14.1*  HGB 12.1* 10.8* 10.2* 10.4* 11.4*  HCT 34.7* 30.9* 30.0* 30.2* 33.3*  MCV 108.8* 108.0* 109.9* 109.8* 111.0*  PLT 51* 41* 35* 38* 40*   Cardiac Enzymes: No results for input(s): CKTOTAL, CKMB, CKMBINDEX, TROPONINI in the last 168 hours. BNP: Invalid input(s): POCBNP CBG: No results for input(s): GLUCAP in the last 168 hours. D-Dimer No results for input(s): DDIMER in the last 72 hours. Hgb A1c No results for input(s): HGBA1C in the last 72 hours. Lipid Profile No results for input(s): CHOL, HDL, LDLCALC, TRIG, CHOLHDL, LDLDIRECT in the last 72 hours. Thyroid function studies No results for input(s): TSH, T4TOTAL, T3FREE, THYROIDAB in the last 72 hours.  Invalid input(s): FREET3 Anemia work up No results for input(s): VITAMINB12, FOLATE, FERRITIN, TIBC, IRON, RETICCTPCT in the last 72 hours. Urinalysis    Component Value  Date/Time   COLORURINE AMBER (A) 01/01/2020 1031   APPEARANCEUR HAZY (A) 01/01/2020 1031   LABSPEC 1.013 01/01/2020 1031   PHURINE 6.0 01/01/2020 1031   GLUCOSEU 50 (A) 01/01/2020 1031   HGBUR SMALL (A) 01/01/2020 1031   BILIRUBINUR MODERATE (A) 01/01/2020 1031   BILIRUBINUR large (A) 03/19/2016 1103   BILIRUBINUR small 12/25/2014 1001   KETONESUR 5 (A) 01/01/2020 1031   PROTEINUR NEGATIVE 01/01/2020 1031   UROBILINOGEN 4.0 03/19/2016 1103   UROBILINOGEN 0.2 01/21/2015 0623   NITRITE NEGATIVE 01/01/2020 1031   LEUKOCYTESUR NEGATIVE 01/01/2020 1031   Sepsis Labs Invalid input(s): PROCALCITONIN,  WBC,  LACTICIDVEN Microbiology Recent Results (from the past 240 hour(s))  Respiratory Panel by RT PCR (Flu A&B, Covid) - Nasopharyngeal Swab     Status: None   Collection Time: 01/01/20  2:07 PM   Specimen: Nasopharyngeal  Swab  Result Value Ref Range Status   SARS Coronavirus 2 by RT PCR NEGATIVE NEGATIVE Final    Comment: (NOTE) SARS-CoV-2 target nucleic acids are NOT DETECTED. The SARS-CoV-2 RNA is generally detectable in upper respiratoy specimens during the acute phase of infection. The lowest concentration of SARS-CoV-2 viral copies this assay can detect is 131 copies/mL. A negative result does not preclude SARS-Cov-2 infection and should not be used as the sole basis for treatment or other patient management decisions. A negative result may occur with  improper specimen collection/handling, submission of specimen other than nasopharyngeal swab, presence of viral mutation(s) within the areas targeted by this assay, and inadequate number of viral copies (<131 copies/mL). A negative result must be combined with clinical observations, patient history, and epidemiological information. The expected result is Negative. Fact Sheet for Patients:  PinkCheek.be Fact Sheet for Healthcare Providers:  GravelBags.it This test is not  yet ap proved or cleared by the [place] FDA and  has been authorized for detection and/or diagnosis of SARS-CoV-2 by FDA under an Emergency Use Authorization (EUA). This EUA will remain  in effect (meaning this test can be used) for the duration of the COVID-19 declaration under Section 564(b)(1) of the Act, 21 U.S.C. section 360bbb-3(b)(1), unless the authorization is terminated or revoked sooner.    Influenza A by PCR NEGATIVE NEGATIVE Final   Influenza B by PCR NEGATIVE NEGATIVE Final    Comment: (NOTE) The Xpert Xpress SARS-CoV-2/FLU/RSV assay is intended as an aid in  the diagnosis of influenza from Nasopharyngeal swab specimens and  should not be used as a sole basis for treatment. Nasal washings and  aspirates are unacceptable for Xpert Xpress SARS-CoV-2/FLU/RSV  testing. Fact Sheet for Patients: PinkCheek.be Fact Sheet for Healthcare Providers: GravelBags.it This test is not yet approved or cleared by the [place] FDA and  has been authorized for detection and/or diagnosis of SARS-CoV-2 by  FDA under an Emergency Use Authorization (EUA). This EUA will remain  in effect (meaning this test can be used) for the duration of the  Covid-19 declaration under Section 564(b)(1) of the Act, 21  U.S.C. section 360bbb-3(b)(1), unless the authorization is  terminated or revoked. Performed at [facility], [place] [address], [place], [place] [address]   Culture, body fluid-bottle     Status: None   Collection Time: 01/02/20 12:36 PM   Specimen: Fluid  Result Value Ref Range Status   Specimen Description FLUID PERITONEAL  Final   Special Requests BOTTLES DRAWN AEROBIC AND ANAEROBIC 10CC  Final   Culture   Final    NO GROWTH 5 DAYS Performed at [facility], [place] [address] [place], [place] [address]    Report Status 01/07/2020 FINAL  Final  Gram stain     Status: None   Collection Time:  01/02/20 12:36 PM   Specimen: Fluid  Result Value Ref Range Status   Specimen Description FLUID PERITONEAL  Final   Special Requests NONE  Final   Gram Stain   Final    FEW WBC PRESENT, PREDOMINANTLY MONONUCLEAR NO ORGANISMS SEEN Performed at [facility], [place] [address], [place], [place] [address]    Report Status 01/02/2020 FINAL  Final     Time coordinating discharge: 35 minutes  SIGNED:   Jonnie Finner, DO  [facility] 01/11/2020, 1:24 PM   If 7PM-7AM, please contact night-coverage [email or web address]

## 2020-01-11 NOTE — Anesthesia Procedure Notes (Signed)
Procedure Name: MAC Date/Time: 01/11/2020 10:54 AM Performed by: Eben Burow, CRNA Pre-anesthesia Checklist: Patient identified, Emergency Drugs available, Suction available, Patient being monitored and Timeout performed Oxygen Delivery Method: Simple face mask Dental Injury: Teeth and Oropharynx as per pre-operative assessment

## 2020-01-11 NOTE — Telephone Encounter (Signed)
-----   Message from Alfredia Ferguson, PA-C sent at 01/11/2020  1:49 PM EDT ----- Regarding: labs and office follow up This pt was discharged from hospital  today - horrible ETOH hepatitis, decompensated cirrhosis.  Pleas call him Monday to come in for labs on Thursday next week - put under Dr Loletha Carrow- CBC,CMET PT/INR, ammonia  Please get him an office visit with DR Loletha Carrow in around 2 weeks , if Dr Loletha Carrow doesn't have any spots I can see him , thanks

## 2020-01-11 NOTE — TOC Transition Note (Addendum)
Transition of Care [facility]) - CM/SW Discharge Note   Patient Details  Name: Jesse Esparza MRN: [id number] Date of Birth: 09-04-1965  Transition of [facility]) CM/SW Contact:  Lynnell Catalan, RN Phone Number: 01/11/2020, 1:26 PM   Clinical Narrative:     Pt provided with [place] letter for dc medications.  Discharge Plan and Services In-house Referral: Clinical Social Work Discharge Planning Services: Medication Assistance                Social Determinants of Health (SDOH) Interventions     Readmission Risk Interventions No flowsheet data found.

## 2020-01-11 NOTE — Op Note (Addendum)
[facility] Patient Name: Jesse Esparza Procedure Date: 01/11/2020 MRN: [id number] Attending MD: Ladene Artist , MD Date of Birth: [birth year] CSN: [id number] Age: 54 Admit Type: Inpatient Procedure:                Upper GI endoscopy Indications:              Epigastric abdominal pain, Suspected                            gastroesophageal reflux disease, Decompensated                            cirrhosis Providers:                Pricilla Riffle. Fuller Plan, MD, Truddie Coco, RN, Cherylynn Ridges,                            Technician, Jefm Miles CRNA Referring MD:             [facility] Medicines:                Monitored Anesthesia Care Complications:            No immediate complications. Estimated Blood Loss:     Estimated blood loss was minimal. Procedure:                Pre-Anesthesia Assessment:                           - Prior to the procedure, a History and Physical                            was performed, and patient medications and                            allergies were reviewed. The patient's tolerance of                            previous anesthesia was also reviewed. The risks                            and benefits of the procedure and the sedation                            options and risks were discussed with the patient.                            All questions were answered, and informed consent                            was obtained. Prior Anticoagulants: The patient has                            taken no previous anticoagulant or antiplatelet                            agents. ASA Grade Assessment:  II - A patient with                            mild systemic disease. After reviewing the risks                            and benefits, the patient was deemed in                            satisfactory condition to undergo the procedure.                           After obtaining informed consent, the endoscope was                            passed under direct  vision. Throughout the                            procedure, the patient's blood pressure, pulse, and                            oxygen saturations were monitored continuously. The                            [id number]) Olympus gastroscope was                            introduced through the mouth, and advanced to the                            second part of duodenum. The upper GI endoscopy was                            accomplished without difficulty. The patient                            tolerated the procedure well. Scope In: Scope Out: Findings:      Two columns of grade I varices with no bleeding and no stigmata of       recent bleeding were found in the distal esophagus. They were 4 mm in       largest diameter. No red wale signs were present.      The exam of the esophagus was otherwise normal.      A large amount of solid food (residue) was found in the gastric fundus       and in the gastric body obscuring about 75% of the mucosa. Liquids and       semisolids were suctioned. Solids could not be suctioned.      Moderate portal hypertensive gastropathy was found in the gastric body       and in the gastric antrum. Biopsies were taken with a cold forceps for       histology.      The exam of the stomach was otherwise normal.      The duodenal bulb and second portion of the duodenum were normal. Impression:               -  Grade I esophageal varices with no bleeding and                            no stigmata of recent bleeding.                           - A large amount of food (residue) in the stomach.                           - Portal hypertensive gastropathy. Biopsied.                           - Normal duodenal bulb and second portion of the                            duodenum. Moderate Sedation:      Not Applicable - Patient had care per Anesthesia. Recommendation:           - Return patient to hospital ward for ongoing care.                           - Clear  liquid diet for now due to gastric                            retention.                           - Continue present medications.                           - Await pathology results.                           - Follow up with Dr. Loletha Carrow as planned. Procedure Code(s):        --- Professional ---                           [phone], Esophagogastroduodenoscopy, flexible,                            transoral; with biopsy, single or multiple Diagnosis Code(s):        --- Professional ---                           I85.00, Esophageal varices without bleeding                           K76.6, Portal hypertension                           K31.89, Other diseases of stomach and duodenum                           R10.13, Epigastric pain CPT copyright 2019 American Medical Association. All rights reserved. The codes documented in this report are preliminary and upon coder review may  be revised to meet current compliance  requirements. Ladene Artist, MD 01/11/2020 11:15:05 AM This report has been signed electronically. Number of Addenda: 0

## 2020-01-11 NOTE — Progress Notes (Signed)
Pt discharged home with father in stable condition. Discharge instructions and scripts given. Pt verbalized understanding. No immediate questions or concerns at this time. Discharged from unit via wheelchair.

## 2020-01-11 NOTE — Telephone Encounter (Signed)
Orders in epic for labs. Pt scheduled for OV with Dr. Loletha Carrow 6/1@4pm .

## 2020-01-11 NOTE — Transfer of Care (Signed)
Immediate Anesthesia Transfer of Care Note  Patient: Jesse Esparza  Procedure(s) Performed: ESOPHAGOGASTRODUODENOSCOPY (EGD) WITH PROPOFOL (N/A ) BIOPSY  Patient Location: PACU and Endoscopy Unit  Anesthesia Type:MAC  Level of Consciousness: awake, alert  and patient cooperative  Airway & Oxygen Therapy: Patient Spontanous Breathing and Patient connected to face mask oxygen  Post-op Assessment: Report given to RN and Post -op Vital signs reviewed and stable  Post vital signs: Reviewed and stable  Last Vitals:  Vitals Value Taken Time  BP 122/59 01/11/20 1112  Temp    Pulse 77 01/11/20 1114  Resp 13 01/11/20 1114  SpO2 100 % 01/11/20 1114  Vitals shown include unvalidated device data.  Last Pain:  Vitals:   01/11/20 0936  TempSrc: Oral  PainSc: 0-No pain         Complications: No apparent anesthesia complications

## 2020-01-11 NOTE — Anesthesia Preprocedure Evaluation (Addendum)
Anesthesia Evaluation  Patient identified by MRN, date of birth, ID band Patient awake    Reviewed: Allergy & Precautions, NPO status , Patient's Chart, lab work & pertinent test results  Airway Mallampati: III  TM Distance: >3 FB Neck ROM: Full    Dental no notable dental hx. (+) Dental Advisory Given, Teeth Intact   Pulmonary neg pulmonary ROS,    Pulmonary exam normal breath sounds clear to auscultation       Cardiovascular hypertension, Pt. on medications Normal cardiovascular exam Rhythm:Regular Rate:Normal     Neuro/Psych PSYCHIATRIC DISORDERS Depression negative neurological ROS     GI/Hepatic Neg liver ROS, GERD  ,  Endo/Other  Morbid obesity  Renal/GU negative Renal ROS     Musculoskeletal negative musculoskeletal ROS (+)   Abdominal (+) + obese,   Peds  Hematology negative hematology ROS (+)   Anesthesia Other Findings   Reproductive/Obstetrics                            Anesthesia Physical Anesthesia Plan  ASA: III  Anesthesia Plan: MAC   Post-op Pain Management:    Induction: Intravenous  PONV Risk Score and Plan: 1 and Propofol infusion and Treatment may vary due to age or medical condition  Airway Management Planned: Natural Airway  Additional Equipment:   Intra-op Plan:   Post-operative Plan:   Informed Consent: I have reviewed the patients History and Physical, chart, labs and discussed the procedure including the risks, benefits and alternatives for the proposed anesthesia with the patient or authorized representative who has indicated his/her understanding and acceptance.     Dental advisory given  Plan Discussed with: CRNA  Anesthesia Plan Comments:        Anesthesia Quick Evaluation

## 2020-01-11 NOTE — Interval H&P Note (Signed)
History and Physical Interval Note:  01/11/2020 10:50 AM  Jesse Esparza  has presented today for surgery, with the diagnosis of epigastric pain.  The various methods of treatment have been discussed with the patient and family. After consideration of risks, benefits and other options for treatment, the patient has consented to  Procedure(s): ESOPHAGOGASTRODUODENOSCOPY (EGD) WITH PROPOFOL (N/A) as a surgical intervention.  The patient's history has been reviewed, patient examined, no change in status, stable for surgery.  I have reviewed the patient's chart and labs.  Questions were answered to the patient's satisfaction.     Pricilla Riffle. Fuller Plan

## 2020-01-14 ENCOUNTER — Other Ambulatory Visit: Payer: Self-pay

## 2020-01-14 ENCOUNTER — Telehealth: Payer: Self-pay

## 2020-01-14 LAB — SURGICAL PATHOLOGY

## 2020-01-14 NOTE — Anesthesia Postprocedure Evaluation (Signed)
Anesthesia Post Note  Patient: Jesse Esparza  Procedure(s) Performed: ESOPHAGOGASTRODUODENOSCOPY (EGD) WITH PROPOFOL (N/A ) BIOPSY     Patient location during evaluation: PACU Anesthesia Type: MAC Level of consciousness: awake and alert Pain management: pain level controlled Vital Signs Assessment: post-procedure vital signs reviewed and stable Respiratory status: spontaneous breathing Cardiovascular status: stable Anesthetic complications: no    Last Vitals:  Vitals:   01/11/20 1145 01/11/20 1227  BP: 140/70 135/61  Pulse: 81 80  Resp: (!) 22 15  Temp:  36.4 C  SpO2: 99% 96%    Last Pain:  Vitals:   01/11/20 1227  TempSrc: Oral  PainSc:                  Nolon Nations

## 2020-01-15 ENCOUNTER — Encounter: Payer: Self-pay | Admitting: Gastroenterology

## 2020-01-15 NOTE — Telephone Encounter (Signed)
Called pt at both contact numbers but was not able to leave a voice message as phone rings with no option to leave a message. Will try again tomorrow.

## 2020-01-15 NOTE — Telephone Encounter (Signed)
Cathy please call this pt and let him know he needs to come in for labs on Thursday, he can come between 7:30am-5pm, he does not need an appt. I scheduled him to see Dr. Loletha Carrow on 6/1 at 4pm. I tried to call him and he does not speak english.

## 2020-01-16 ENCOUNTER — Inpatient Hospital Stay (HOSPITAL_COMMUNITY): Payer: Self-pay

## 2020-01-16 ENCOUNTER — Telehealth: Payer: Self-pay | Admitting: Gastroenterology

## 2020-01-16 ENCOUNTER — Inpatient Hospital Stay (HOSPITAL_COMMUNITY)
Admission: EM | Admit: 2020-01-16 | Discharge: 2020-01-22 | DRG: 871 | Disposition: A | Discharge status: Hospice | Payer: Self-pay | Attending: Internal Medicine | Admitting: Internal Medicine

## 2020-01-16 ENCOUNTER — Encounter (HOSPITAL_COMMUNITY): Payer: Self-pay

## 2020-01-16 ENCOUNTER — Emergency Department (HOSPITAL_COMMUNITY): Payer: Self-pay

## 2020-01-16 DIAGNOSIS — Z20822 Contact with and (suspected) exposure to covid-19: Secondary | ICD-10-CM | POA: Diagnosis present

## 2020-01-16 DIAGNOSIS — K659 Peritonitis, unspecified: Secondary | ICD-10-CM | POA: Diagnosis present

## 2020-01-16 DIAGNOSIS — R1084 Generalized abdominal pain: Secondary | ICD-10-CM

## 2020-01-16 DIAGNOSIS — K759 Inflammatory liver disease, unspecified: Secondary | ICD-10-CM

## 2020-01-16 DIAGNOSIS — K219 Gastro-esophageal reflux disease without esophagitis: Secondary | ICD-10-CM | POA: Diagnosis present

## 2020-01-16 DIAGNOSIS — E119 Type 2 diabetes mellitus without complications: Secondary | ICD-10-CM | POA: Diagnosis present

## 2020-01-16 DIAGNOSIS — K7290 Hepatic failure, unspecified without coma: Secondary | ICD-10-CM

## 2020-01-16 DIAGNOSIS — R6520 Severe sepsis without septic shock: Secondary | ICD-10-CM | POA: Diagnosis present

## 2020-01-16 DIAGNOSIS — E8770 Fluid overload, unspecified: Secondary | ICD-10-CM | POA: Diagnosis present

## 2020-01-16 DIAGNOSIS — I10 Essential (primary) hypertension: Secondary | ICD-10-CM | POA: Diagnosis present

## 2020-01-16 DIAGNOSIS — E875 Hyperkalemia: Secondary | ICD-10-CM | POA: Diagnosis present

## 2020-01-16 DIAGNOSIS — K7031 Alcoholic cirrhosis of liver with ascites: Secondary | ICD-10-CM | POA: Diagnosis present

## 2020-01-16 DIAGNOSIS — E785 Hyperlipidemia, unspecified: Secondary | ICD-10-CM | POA: Diagnosis present

## 2020-01-16 DIAGNOSIS — R109 Unspecified abdominal pain: Secondary | ICD-10-CM

## 2020-01-16 DIAGNOSIS — E7800 Pure hypercholesterolemia, unspecified: Secondary | ICD-10-CM | POA: Diagnosis present

## 2020-01-16 DIAGNOSIS — A4151 Sepsis due to Escherichia coli [E. coli]: Principal | ICD-10-CM | POA: Diagnosis present

## 2020-01-16 DIAGNOSIS — K7040 Alcoholic hepatic failure without coma: Secondary | ICD-10-CM | POA: Diagnosis present

## 2020-01-16 DIAGNOSIS — E872 Acidosis: Secondary | ICD-10-CM | POA: Diagnosis present

## 2020-01-16 DIAGNOSIS — R040 Epistaxis: Secondary | ICD-10-CM | POA: Diagnosis not present

## 2020-01-16 DIAGNOSIS — Z66 Do not resuscitate: Secondary | ICD-10-CM | POA: Diagnosis present

## 2020-01-16 DIAGNOSIS — K7011 Alcoholic hepatitis with ascites: Secondary | ICD-10-CM | POA: Diagnosis present

## 2020-01-16 DIAGNOSIS — R0600 Dyspnea, unspecified: Secondary | ICD-10-CM

## 2020-01-16 DIAGNOSIS — N179 Acute kidney failure, unspecified: Secondary | ICD-10-CM | POA: Diagnosis present

## 2020-01-16 DIAGNOSIS — A419 Sepsis, unspecified organism: Secondary | ICD-10-CM | POA: Diagnosis present

## 2020-01-16 DIAGNOSIS — N390 Urinary tract infection, site not specified: Secondary | ICD-10-CM | POA: Diagnosis present

## 2020-01-16 DIAGNOSIS — R6521 Severe sepsis with septic shock: Secondary | ICD-10-CM | POA: Diagnosis present

## 2020-01-16 DIAGNOSIS — R5381 Other malaise: Secondary | ICD-10-CM | POA: Diagnosis present

## 2020-01-16 DIAGNOSIS — K6389 Other specified diseases of intestine: Secondary | ICD-10-CM | POA: Diagnosis present

## 2020-01-16 DIAGNOSIS — R7881 Bacteremia: Secondary | ICD-10-CM | POA: Diagnosis present

## 2020-01-16 DIAGNOSIS — Z515 Encounter for palliative care: Secondary | ICD-10-CM | POA: Diagnosis present

## 2020-01-16 DIAGNOSIS — K7460 Unspecified cirrhosis of liver: Secondary | ICD-10-CM | POA: Diagnosis present

## 2020-01-16 DIAGNOSIS — D6959 Other secondary thrombocytopenia: Secondary | ICD-10-CM | POA: Diagnosis present

## 2020-01-16 DIAGNOSIS — D500 Iron deficiency anemia secondary to blood loss (chronic): Secondary | ICD-10-CM | POA: Diagnosis present

## 2020-01-16 DIAGNOSIS — K766 Portal hypertension: Secondary | ICD-10-CM | POA: Diagnosis present

## 2020-01-16 DIAGNOSIS — Z79899 Other long term (current) drug therapy: Secondary | ICD-10-CM

## 2020-01-16 DIAGNOSIS — D689 Coagulation defect, unspecified: Secondary | ICD-10-CM | POA: Diagnosis present

## 2020-01-16 LAB — URINALYSIS, ROUTINE W REFLEX MICROSCOPIC
Glucose, UA: NEGATIVE mg/dL
Ketones, ur: NEGATIVE mg/dL
Leukocytes,Ua: NEGATIVE
Nitrite: NEGATIVE
Protein, ur: NEGATIVE mg/dL
Specific Gravity, Urine: 1.013 (ref 1.005–1.030)
pH: 6 (ref 5.0–8.0)

## 2020-01-16 LAB — COMPREHENSIVE METABOLIC PANEL
ALT: 232 U/L — ABNORMAL HIGH (ref 0–44)
AST: 209 U/L — ABNORMAL HIGH (ref 15–41)
Albumin: 2.3 g/dL — ABNORMAL LOW (ref 3.5–5.0)
Alkaline Phosphatase: 228 U/L — ABNORMAL HIGH (ref 38–126)
Anion gap: 13 (ref 5–15)
BUN: 45 mg/dL — ABNORMAL HIGH (ref 6–20)
CO2: 27 mmol/L (ref 22–32)
Calcium: 8.4 mg/dL — ABNORMAL LOW (ref 8.9–10.3)
Chloride: 91 mmol/L — ABNORMAL LOW (ref 98–111)
Creatinine, Ser: 1.37 mg/dL — ABNORMAL HIGH (ref 0.61–1.24)
GFR calc Af Amer: >60 mL/min (ref >60)
GFR calc non Af Amer: 58 mL/min — ABNORMAL LOW (ref >60)
Glucose, Bld: 97 mg/dL (ref 70–99)
Potassium: 4.8 mmol/L (ref 3.5–5.1)
Sodium: 131 mmol/L — ABNORMAL LOW (ref 135–145)
Total Bilirubin: 36 mg/dL (ref 0.3–1.2)
Total Protein: 6.5 g/dL (ref 6.5–8.1)

## 2020-01-16 LAB — GLUCOSE, CAPILLARY
Glucose-Capillary: 108 mg/dL — ABNORMAL HIGH (ref 70–99)
Glucose-Capillary: 121 mg/dL — ABNORMAL HIGH (ref 70–99)

## 2020-01-16 LAB — CBC WITH DIFFERENTIAL/PLATELET
Abs Immature Granulocytes: 0.5 10*3/uL — ABNORMAL HIGH (ref 0.00–0.07)
Band Neutrophils: 25 %
Basophils Absolute: 0 10*3/uL (ref 0.0–0.1)
Basophils Relative: 0 %
Eosinophils Absolute: 0 10*3/uL (ref 0.0–0.5)
Eosinophils Relative: 0 %
HCT: 34.7 % — ABNORMAL LOW (ref 39.0–52.0)
Hemoglobin: 11.7 g/dL — ABNORMAL LOW (ref 13.0–17.0)
Lymphocytes Relative: 3 %
Lymphs Abs: 0.5 10*3/uL — ABNORMAL LOW (ref 0.7–4.0)
MCH: 37.6 pg — ABNORMAL HIGH (ref 26.0–34.0)
MCHC: 33.7 g/dL (ref 30.0–36.0)
MCV: 111.6 fL — ABNORMAL HIGH (ref 80.0–100.0)
Metamyelocytes Relative: 1 %
Monocytes Absolute: 1 10*3/uL (ref 0.1–1.0)
Monocytes Relative: 6 %
Myelocytes: 2 %
Neutro Abs: 15 10*3/uL — ABNORMAL HIGH (ref 1.7–7.7)
Neutrophils Relative %: 63 %
Platelets: 37 10*3/uL — ABNORMAL LOW (ref 150–400)
RBC: 3.11 MIL/uL — ABNORMAL LOW (ref 4.22–5.81)
RDW: 17.5 % — ABNORMAL HIGH (ref 11.5–15.5)
WBC Morphology: INCREASED
WBC: 17 10*3/uL — ABNORMAL HIGH (ref 4.0–10.5)
nRBC: 0.2 % (ref 0.0–0.2)

## 2020-01-16 LAB — TSH: TSH: 1.907 u[IU]/mL (ref 0.350–4.500)

## 2020-01-16 LAB — LACTIC ACID, PLASMA
Lactic Acid, Venous: 3.1 mmol/L (ref 0.5–1.9)
Lactic Acid, Venous: 3.4 mmol/L (ref 0.5–1.9)
Lactic Acid, Venous: 3.5 mmol/L (ref 0.5–1.9)
Lactic Acid, Venous: 3.3 mmol/L (ref 0.5–1.9)

## 2020-01-16 LAB — MAGNESIUM: Magnesium: 1.7 mg/dL (ref 1.7–2.4)

## 2020-01-16 LAB — PROTIME-INR
INR: 2.6 — ABNORMAL HIGH (ref 0.8–1.2)
Prothrombin Time: 27.3 seconds — ABNORMAL HIGH (ref 11.4–15.2)

## 2020-01-16 LAB — SARS CORONAVIRUS 2 BY RT PCR (HOSPITAL ORDER, PERFORMED IN ~~LOC~~ HOSPITAL LAB): SARS Coronavirus 2: NEGATIVE

## 2020-01-16 LAB — APTT: aPTT: 46 seconds — ABNORMAL HIGH (ref 24–36)

## 2020-01-16 LAB — MRSA PCR SCREENING: MRSA by PCR: NEGATIVE

## 2020-01-16 MED ORDER — SODIUM CHLORIDE 0.9% FLUSH
3.0000 mL | Freq: Two times a day (BID) | INTRAVENOUS | Status: DC
  Administered 2020-01-16 – 2020-01-21 (×11): 3 mL via INTRAVENOUS

## 2020-01-16 MED ORDER — IOHEXOL 9 MG/ML PO SOLN
ORAL | Status: AC
  Filled 2020-01-16: qty 1000

## 2020-01-16 MED ORDER — RIFAXIMIN 550 MG PO TABS
550.0000 mg | ORAL_TABLET | Freq: Two times a day (BID) | ORAL | Status: DC
  Administered 2020-01-16 – 2020-01-22 (×12): 550 mg via ORAL
  Filled 2020-01-16 (×14): qty 1

## 2020-01-16 MED ORDER — SODIUM CHLORIDE 0.9 % IV SOLN
2.0000 g | INTRAVENOUS | Status: DC
  Administered 2020-01-16 – 2020-01-20 (×5): 2 g via INTRAVENOUS
  Filled 2020-01-16: qty 2
  Filled 2020-01-16: qty 20
  Filled 2020-01-16: qty 2
  Filled 2020-01-16: qty 20
  Filled 2020-01-16 (×2): qty 2

## 2020-01-16 MED ORDER — SODIUM CHLORIDE 0.9 % IV BOLUS
1000.0000 mL | Freq: Once | INTRAVENOUS | Status: AC
  Administered 2020-01-16: 1000 mL via INTRAVENOUS

## 2020-01-16 MED ORDER — LACTULOSE 20 G PO PACK: 20.0000 g | PACK | Freq: Two times a day (BID) | ORAL | Status: DC

## 2020-01-16 MED ORDER — SODIUM CHLORIDE 0.9 % IV SOLN: 2.0000 g | Freq: Three times a day (TID) | INTRAVENOUS | Status: DC

## 2020-01-16 MED ORDER — SODIUM CHLORIDE 0.9 % IV SOLN
2.0000 g | Freq: Once | INTRAVENOUS | Status: AC
  Administered 2020-01-16: 2 g via INTRAVENOUS
  Filled 2020-01-16: qty 2

## 2020-01-16 MED ORDER — CHLORHEXIDINE GLUCONATE CLOTH 2 % EX PADS
6.0000 | MEDICATED_PAD | Freq: Every day | CUTANEOUS | Status: DC
  Administered 2020-01-17 – 2020-01-20 (×4): 6 via TOPICAL

## 2020-01-16 MED ORDER — PANTOPRAZOLE SODIUM 40 MG IV SOLR
40.0000 mg | Freq: Two times a day (BID) | INTRAVENOUS | Status: DC
  Administered 2020-01-16 – 2020-01-17 (×2): 40 mg via INTRAVENOUS
  Filled 2020-01-16 (×2): qty 40

## 2020-01-16 MED ORDER — SODIUM CHLORIDE 0.9% FLUSH
3.0000 mL | Freq: Once | INTRAVENOUS | Status: AC
  Administered 2020-01-16: 3 mL via INTRAVENOUS

## 2020-01-16 MED ORDER — SODIUM CHLORIDE 0.9 % IV BOLUS: 1000.0000 mL | Freq: Once | INTRAVENOUS | Status: DC

## 2020-01-16 MED ORDER — SODIUM CHLORIDE 0.9 % IV BOLUS (SEPSIS)
2000.0000 mL | Freq: Once | INTRAVENOUS | Status: AC
  Administered 2020-01-16: 2000 mL via INTRAVENOUS

## 2020-01-16 MED ORDER — PREDNISOLONE 5 MG PO TABS: 40.0000 mg | ORAL_TABLET | Freq: Every day | ORAL | Status: DC

## 2020-01-16 MED ORDER — SODIUM CHLORIDE 0.9 % IV SOLN: 2.0000 g | INTRAVENOUS | Status: DC

## 2020-01-16 MED ORDER — ONDANSETRON HCL 4 MG PO TABS: 4.0000 mg | ORAL_TABLET | Freq: Four times a day (QID) | ORAL | Status: DC | PRN

## 2020-01-16 MED ORDER — PREDNISOLONE 5 MG PO TABS
20.0000 mg | ORAL_TABLET | Freq: Every day | ORAL | Status: DC
  Administered 2020-01-17: 20 mg via ORAL
  Filled 2020-01-16 (×2): qty 4

## 2020-01-16 MED ORDER — SODIUM CHLORIDE 0.9 % IV SOLN: INTRAVENOUS | Status: DC

## 2020-01-16 MED ORDER — LIDOCAINE HCL 1 % IJ SOLN
INTRAMUSCULAR | Status: AC
  Filled 2020-01-16: qty 20

## 2020-01-16 MED ORDER — ONDANSETRON HCL 4 MG/2ML IJ SOLN: 4.0000 mg | Freq: Four times a day (QID) | INTRAMUSCULAR | Status: DC | PRN

## 2020-01-16 MED ORDER — LACTULOSE 10 GM/15ML PO SOLN
20.0000 g | Freq: Two times a day (BID) | ORAL | Status: DC
  Administered 2020-01-16 – 2020-01-17 (×2): 20 g via ORAL
  Filled 2020-01-16 (×2): qty 30

## 2020-01-16 MED ORDER — ALBUMIN HUMAN 25 % IV SOLN
50.0000 g | Freq: Four times a day (QID) | INTRAVENOUS | Status: AC
  Administered 2020-01-16 – 2020-01-17 (×4): 50 g via INTRAVENOUS
  Filled 2020-01-16 (×3): qty 200

## 2020-01-16 MED ORDER — PREDNISOLONE 5 MG PO TABS
10.0000 mg | ORAL_TABLET | Freq: Every day | ORAL | Status: AC
  Administered 2020-01-19 – 2020-01-20 (×2): 10 mg via ORAL
  Filled 2020-01-16 (×2): qty 2

## 2020-01-16 NOTE — ED Triage Notes (Signed)
Patient arrived via [facility] from home.  C/O weakness, abdominal distention that is more than normal, and intermittent dizziness when standing.    EMS reports no orthostatic hypotension when standing.    Hx. Liver failure   A/Ox4 Patient able to stand turn and sit.  Patient reports he can not walk.   Family on the way.

## 2020-01-16 NOTE — ED Notes (Signed)
Allred, PA at bedside for paracentesis. PA speaking with Pahwani at this time discussion paracentesis options.    Per Pahwani verbal order for NS bolus.

## 2020-01-16 NOTE — Consult Note (Signed)
NAME:  Jesse Esparza, MRN:  [id number], DOB:  Feb 07, 1966, LOS: 0 ADMISSION DATE:  01/16/2020, CONSULTATION DATE:  01/16/19 REFERRING MD:  Loann Quill MD, CHIEF COMPLAINT:  Hypotension   Brief History   54 year old with alcohol hepatitis, cirrhosis with recent admission for alcohol hepatitis admitted with weakness, dizziness, abdominal pain, sepsis, elevated lactic acid.  Evaluated by IR for paracentesis however no fluid found on ultrasound. Patient has received 3 L IV fluid and is about to get albumin. PCCM consulted for persistent hypotension and lactic acidosis.    Past Medical History    has a past medical history of Cirrhosis ([place]), Depression, Elevated liver function tests, Gallbladder sludge, GERD (gastroesophageal reflux disease), Hypercholesteremia, Hypertension, and Obesity.   Significant Hospital Events    5/19 Admit  Consults:  PCCM, gastroenterology  Procedures:    Significant Diagnostic Tests:   Chest x-ray 5/19-borderline cardiomegaly, mild atelectasis in the left lung base.  I have reviewed the images personally.  Micro Data:  Blood cultures 5/19 Urine culture 5/19  Antimicrobials:  Ceftriaxone 5/19>   Interim history/subjective:    Objective   Blood pressure 115/79, pulse 100, temperature 99.7 F (37.6 C), temperature source Oral, resp. rate 20, height 5\' 5"  (1.651 m), weight 108.9 kg, SpO2 100 %.        Intake/Output Summary (Last 24 hours) at 01/16/2020 1703 Last data filed at 01/16/2020 1535 Gross per 24 hour  Intake 3099 ml  Output --  Net 3099 ml   Filed Weights   01/16/20 1223  Weight: 108.9 kg    Examination: Blood pressure (!) 96/56, pulse 99, temperature 99.7 F (37.6 C), temperature source Oral, resp. rate (!) 21, height 5\' 5"  (1.651 m), weight 108.9 kg, SpO2 100 %. Gen:      No acute distress, chronically ill-appearing HEENT:  EOMI, sclera, jaundiced Neck:     No masses; no thyromegaly Lungs:    Clear to auscultation bilaterally;  normal respiratory effort CV:         Regular rate and rhythm; no murmurs Abd:      Distended abdomen. Ext:    No edema; adequate peripheral perfusion Skin:      Warm and dry; no rash Neuro: Awake, oriented  Resolved Hospital Problem list     Assessment & Plan:  Septic shock.  Possible source urine vs SBP Though no fluid noted on IR ultrasound.  Follow CT abdomen pelvis Appears to be responding to IV fluids. Map at time of my assessment was 74.  Follow lactic acid No need for pressors at present. Monitor in ICU  Alcohol cirrhosis with decompensation Recent admission for acute alcohol hepatitis GI on board Albumin 50 g IV every 6 Ceftriaxone  AKI Secondary to ATN versus hepatorenal Monitor urine output and creatinine  Best practice:  Diet: NPO Pain/Anxiety/Delirium protocol (if indicated): NA VAP protocol (if indicated): NA DVT prophylaxis: SCDs GI prophylaxis: protonix Glucose control: [place] Mobility: Bed Code Status: Full Family Communication: Patient updated Disposition: SDU  Labs   CBC: Recent Labs  Lab 01/10/20 0538 01/11/20 0535 01/16/20 1226  WBC 12.7* 17.0* 17.0*  NEUTROABS 9.9* 14.1* 15.0*  HGB 10.4* 11.4* 11.7*  HCT 30.2* 33.3* 34.7*  MCV 109.8* 111.0* 111.6*  PLT 38* 40* 37*    Basic Metabolic Panel: Recent Labs  Lab 01/10/20 0538 01/11/20 0535 01/16/20 1226  NA 137 133* 131*  K 4.4 5.0 4.8  CL 97* 96* 91*  CO2 29 27 27   GLUCOSE 117* 110* 97  BUN  38* 38* 45*  CREATININE 1.18 1.06 1.37*  CALCIUM 8.8* 8.6* 8.4*  MG  --   --  1.7   GFR: Estimated Creatinine Clearance: 69.4 mL/min (A) (by C-G formula based on SCr of 1.37 mg/dL (H)). Recent Labs  Lab 01/10/20 0538 01/11/20 0535 01/16/20 1226 01/16/20 1448  WBC 12.7* 17.0* 17.0*  --   LATICACIDVEN  --   --  3.1* 3.4*    Liver Function Tests: Recent Labs  Lab 01/10/20 0538 01/11/20 0535 01/16/20 1226  AST 168* 192* 209*  ALT 142* 174* 232*  ALKPHOS 150* 166* 228*    BILITOT 29.9* 26.8* 36.0*  PROT 6.3* 6.5 6.5  ALBUMIN 2.4* 2.5* 2.3*   No results for input(s): LIPASE, AMYLASE in the last 168 hours. No results for input(s): AMMONIA in the last 168 hours.  ABG    Component Value Date/Time   TCO2 26 09/29/2017 1048     Coagulation Profile: Recent Labs  Lab 01/10/20 0538 01/11/20 0535 01/16/20 1226  INR 2.8* 2.5* 2.6*    Cardiac Enzymes: No results for input(s): CKTOTAL, CKMB, CKMBINDEX, TROPONINI in the last 168 hours.  HbA1C: Hemoglobin A1C  Date/Time Value Ref Range Status  03/10/2017 04:17 PM 4.1  Final  06/06/2015 03:05 PM 5.0  Final    CBG: No results for input(s): GLUCAP in the last 168 hours.  Review of Systems:   REVIEW OF SYSTEMS:   All negative; except for those that are bolded, which indicate positives.  Constitutional: weight loss, weight gain, night sweats, fevers, chills, fatigue, weakness.  HEENT: headaches, sore throat, sneezing, nasal congestion, post nasal drip, difficulty swallowing, tooth/dental problems, visual complaints, visual changes, ear aches. Neuro: difficulty with speech, weakness, numbness, ataxia. CV:  chest pain, orthopnea, PND, swelling in lower extremities, dizziness, palpitations, syncope.  Resp: cough, hemoptysis, dyspnea, wheezing. GI: heartburn, indigestion, abdominal pain, nausea, vomiting, diarrhea, constipation, change in bowel habits, loss of appetite, hematemesis, melena, hematochezia.  GU: dysuria, change in color of urine, urgency or frequency, flank pain, hematuria. MSK: joint pain or swelling, decreased range of motion. Psych: change in mood or affect, depression, anxiety, suicidal ideations, homicidal ideations. Skin: rash, itching, bruising.  Past Medical History  He,  has a past medical history of Cirrhosis ([place]), Depression, Elevated liver function tests, Gallbladder sludge, GERD (gastroesophageal reflux disease), Hypercholesteremia, Hypertension, and Obesity.   Surgical  History    Past Surgical History:  Procedure Laterality Date  . COLONOSCOPY  2016  . left testicular  childhood  . tumor on right breast  childhood     Social History   reports that he has never smoked. He has never used smokeless tobacco. He reports current alcohol use of about 2.0 - 3.0 standard drinks of alcohol per week. He reports that he does not use drugs.   Family History   His family history includes Hyperlipidemia in his father and mother. There is no history of Colon cancer, Esophageal cancer, Rectal cancer, or Stomach cancer.   Allergies No Known Allergies   Home Medications  Prior to Admission medications   Medication Sig Start Date End Date Taking? Authorizing Provider  furosemide (LASIX) 40 MG tablet Take 1 tablet (40 mg total) by mouth daily. 01/12/20 02/11/20 Yes Kyle, Tyrone A, DO  lactulose (CEPHULAC) 20 g packet Take 1 packet (20 g total) by mouth 2 (two) times daily. 01/11/20 02/10/20 Yes Kyle, Tyrone A, DO  pantoprazole (PROTONIX) 40 MG tablet Take 1 tablet (40 mg total) by mouth 2 (two) times  daily before a meal. 01/11/20 02/10/20 Yes Kyle, Tyrone A, DO  prednisoLONE 5 MG TABS tablet Take 8 tablets (40 mg total) by mouth daily for 20 days. 01/11/20 01/31/20 Yes Kyle, Tyrone A, DO  rifaximin (XIFAXAN) 550 MG TABS tablet Take 1 tablet (550 mg total) by mouth 2 (two) times daily. 01/11/20 02/10/20 Yes Kyle, Tyrone A, DO  spironolactone (ALDACTONE) 100 MG tablet Take 1 tablet (100 mg total) by mouth daily. 01/12/20 02/11/20 Yes Kyle, Tyrone A, DO  thiamine 100 MG tablet Take 1 tablet (100 mg total) by mouth daily. 01/12/20 02/11/20 Yes Cherylann Ratel A, DO     Critical care time:     The patient is critically ill with multiple organ system failure and requires high complexity decision making for assessment and support, frequent evaluation and titration of therapies, advanced monitoring, review of radiographic studies and interpretation of complex data.   Critical Care Time  devoted to patient care services, exclusive of separately billable procedures, described in this note is 35  minutes.   Marshell Garfinkel MD [facility] Please see [email or web address] for pager details.  01/16/2020, 5:36 PM

## 2020-01-16 NOTE — Telephone Encounter (Signed)
Thank you for the update. I am afraid he is feeling very poorly because he has severe alcoholic hepatitis causing decompensation of his cirrhosis.  The hospital discharge summary would include all of the medications with their dosing and instructions, so that is the best resource to review those medicine plans with him.  Please have him go for the CBC, CMP and PT/INR today if he is able to have someone bring him to the lab.  He should not be driving.

## 2020-01-16 NOTE — ED Notes (Signed)
Date and time results received: 01/16/20 1:24 PM  (use smartphrase ".now" to insert current time)  Test/Critical: Lactic 3.1 Test/Critical Value: Total Bili 36.0  Name of Provider Notified: [place]  Orders Received? Or Actions Taken?: Orders Received - See Orders for details

## 2020-01-16 NOTE — ED Notes (Addendum)
Per Loletha Carrow, MD do not give 4th NS bolus.   Pharmacy paged to send albumin.

## 2020-01-16 NOTE — Progress Notes (Signed)
CT findings noted as below:  IMPRESSION: 1. Findings suspicious for pneumatosis in the ascending colon, with a few adjacent foci of extraluminal free air. Small amount of mesenteric venous gas is suspected in the region of pneumatosis. However, there is no significant colonic wall thickening or inflammatory change in the region of pneumatosis and extraluminal air. This may be benign pneumatosis, and free air in the absence of bowel perforation has been described, however this is indeterminate on imaging. Close clinical follow-up is recommended. 2. Mild wall thickening of the duodenum and proximal small bowel loops, likely reactive. 3. Cirrhosis with portal hypertension, splenomegaly, and small volume abdominopelvic ascites. 4. Cholelithiasis.  Per radiology, no signs of perforation. Will monitor for now. Low threshold to consult general surgery but he would be very poor candidate for any surgical exploration.

## 2020-01-16 NOTE — Telephone Encounter (Signed)
Dr. Loletha Carrow please see note below. Per Amy pt is supposed to have labs done tomorrow and is scheduled for OV 01/29/20@4pm  with you for follow-up. Please see note below from pt and advise. Pts labs will most likely determine any med changes.

## 2020-01-16 NOTE — Telephone Encounter (Signed)
Medications from discharge summary were reviewed with the pt and he reports he is taking the medications as on summary. He knows he is to continue them at that current dose.

## 2020-01-16 NOTE — Telephone Encounter (Signed)
Pt reported that he is being escorted by ambulance to [place].

## 2020-01-16 NOTE — H&P (Signed)
History and Physical    Jesse Esparza [id number] DOB: Jan 26, 1966 DOA: 01/16/2020  PCP: Charlott Rakes, MD  Patient coming from: Home  I have personally briefly reviewed patient's old medical records in [place]  Chief Complaint: Dizzy, weakness and abdominal pain  HPI: Jesse Esparza is a 54 y.o. male with medical history significant of alcoholic hepatitis and cirrhosis, GERD, hyperlipidemia, hypertension, obesity who was recently hospitalized from 01/01/2020 through 01/11/2020 for acute alcoholic hepatitis very underwent paracentesis on 01/02/2020 with drainage of 1.4 L of yellow ascitic fluid and also had EGD done on 01/11/2020 and was discharged on twice daily Protonix.  He called GI office today stating that he suddenly started feeling very weak, dizzy and also had abdominal pain.  He was advised to come to the emergency department.  His symptoms started today.  He also complains of some subjective fever and chills.  His pain is located in the right lower quadrant.  Crampy, 4 out of 10 with no aggravating or relieving factor.  He had associated nausea but no vomiting.  No chest pain or shortness of breath or any urinary problem or any problem with bowel movement.  ED Course: Upon arrival to ED he was tachycardic, tachypneic and hypotensive with blood pressure of 89/61.  He was given a liter of IV fluid with not much improvement in his blood pressure.  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  Elevated lactic acid.  Elevated creatinine of 1.37.  Worsened hyperbilirubinemia as well as LFTs.  Abdominal x-ray was done which was unremarkable.  Initial call was made to PCCM to admit this patient to ICU however ED physician was told that this patient is appropriate for stepdown unit.  Hospital service were then called to admit the patient.  By the time I was called, patient  had received about 1400 cc of IV fluids.  He is going to receive Total 3 L of IV fluids.  Review of Systems: As per HPI otherwise negative.    Past Medical History:  Diagnosis Date  . Cirrhosis ([place])   . Depression   . Elevated liver function tests   . Gallbladder sludge   . GERD (gastroesophageal reflux disease)   . Hypercholesteremia   . Hypertension   . Obesity     Past Surgical History:  Procedure Laterality Date  . COLONOSCOPY  2016  . left testicular  childhood  . tumor on right breast  childhood     reports that he has never smoked. He has never used smokeless tobacco. He reports current alcohol use of about 2.0 - 3.0 standard drinks of alcohol per week. He reports that he does not use drugs.  No Known Allergies  Family History  Problem Relation Age of Onset  . Hyperlipidemia Father   . Hyperlipidemia Mother   . Colon cancer Neg Hx   . Esophageal cancer Neg Hx   . Rectal cancer Neg Hx   . Stomach cancer Neg Hx     Prior to Admission medications   Medication Sig Start Date End Date Taking? Authorizing Provider  furosemide (LASIX) 40 MG tablet Take 1 tablet (40 mg total) by mouth daily. 01/12/20 02/11/20 Yes Kyle, Tyrone A, DO  lactulose (CEPHULAC) 20 g packet Take 1 packet (20 g total) by mouth 2 (two) times daily. 01/11/20 02/10/20 Yes Kyle, Tyrone A, DO  pantoprazole (PROTONIX) 40 MG tablet Take 1 tablet (40  mg total) by mouth 2 (two) times daily before a meal. 01/11/20 02/10/20 Yes Kyle, Tyrone A, DO  prednisoLONE 5 MG TABS tablet Take 8 tablets (40 mg total) by mouth daily for 20 days. 01/11/20 01/31/20 Yes Kyle, Tyrone A, DO  rifaximin (XIFAXAN) 550 MG TABS tablet Take 1 tablet (550 mg total) by mouth 2 (two) times daily. 01/11/20 02/10/20 Yes Kyle, Tyrone A, DO  spironolactone (ALDACTONE) 100 MG tablet Take 1 tablet (100 mg total) by mouth daily. 01/12/20 02/11/20 Yes Kyle, Tyrone A, DO  thiamine 100 MG tablet Take 1 tablet (100 mg total) by mouth daily. 01/12/20 02/11/20  Yes Jonnie Finner, DO    Physical Exam: Vitals:   01/16/20 1321 01/16/20 1337 01/16/20 1345 01/16/20 1415  BP:  94/62 (!) 86/56 (!) 95/56  Pulse:  (!) 110 (!) 120 (!) 107  Resp:  19 19   Temp:      TempSrc:      SpO2: 100% 100% 99% 98%  Weight:      Height:        Constitutional: NAD, calm, comfortable Vitals:   01/16/20 1321 01/16/20 1337 01/16/20 1345 01/16/20 1415  BP:  94/62 (!) 86/56 (!) 95/56  Pulse:  (!) 110 (!) 120 (!) 107  Resp:  19 19   Temp:      TempSrc:      SpO2: 100% 100% 99% 98%  Weight:      Height:       Eyes: PERRL, lids and conjunctivae normal, very icteric ENMT: Mucous membranes are dry, posterior pharynx clear of any exudate or lesions.Normal dentition.  Neck: normal, supple, no masses, no thyromegaly Respiratory: clear to auscultation bilaterally, no wheezing, no crackles. Normal respiratory effort. No accessory muscle use.  Cardiovascular: Regular rate and rhythm with sinus tachycardia, no murmurs / rubs / gallops.  +2 pitting edema bilateral lower extremity. 2+ pedal pulses. No carotid bruits.  Abdomen: Right lower quadrant tenderness, no masses palpated. No hepatosplenomegaly. Bowel sounds positive.  Musculoskeletal: no clubbing / cyanosis. No joint deformity upper and lower extremities. Good ROM, no contractures. Normal muscle tone.  Skin: no rashes, lesions, ulcers. No induration Neurologic: CN 2-12 grossly intact. Sensation intact, DTR normal. Strength 5/5 in all 4.  Psychiatric: Normal judgment and insight. Alert and oriented x 3. Normal mood.    Labs on Admission: I have personally reviewed following labs and imaging studies  CBC: Recent Labs  Lab 01/10/20 0538 01/11/20 0535 01/16/20 1226  WBC 12.7* 17.0* 17.0*  NEUTROABS 9.9* 14.1* 15.0*  HGB 10.4* 11.4* 11.7*  HCT 30.2* 33.3* 34.7*  MCV 109.8* 111.0* 111.6*  PLT 38* 40* 37*   Basic Metabolic Panel: Recent Labs  Lab 01/10/20 0538 01/11/20 0535 01/16/20 1226  NA 137 133*  131*  K 4.4 5.0 4.8  CL 97* 96* 91*  CO2 29 27 27   GLUCOSE 117* 110* 97  BUN 38* 38* 45*  CREATININE 1.18 1.06 1.37*  CALCIUM 8.8* 8.6* 8.4*   GFR: Estimated Creatinine Clearance: 69.4 mL/min (A) (by C-G formula based on SCr of 1.37 mg/dL (H)). Liver Function Tests: Recent Labs  Lab 01/10/20 0538 01/11/20 0535 01/16/20 1226  AST 168* 192* 209*  ALT 142* 174* 232*  ALKPHOS 150* 166* 228*  BILITOT 29.9* 26.8* 36.0*  PROT 6.3* 6.5 6.5  ALBUMIN 2.4* 2.5* 2.3*   No results for input(s): LIPASE, AMYLASE in the last 168 hours. No results for input(s): AMMONIA in the last 168 hours. Coagulation Profile: Recent Labs  Lab 01/10/20 0538 01/11/20 0535  INR 2.8* 2.5*   Cardiac Enzymes: No results for input(s): CKTOTAL, CKMB, CKMBINDEX, TROPONINI in the last 168 hours. BNP (last 3 results) No results for input(s): PROBNP in the last 8760 hours. HbA1C: No results for input(s): HGBA1C in the last 72 hours. CBG: No results for input(s): GLUCAP in the last 168 hours. Lipid Profile: No results for input(s): CHOL, HDL, LDLCALC, TRIG, CHOLHDL, LDLDIRECT in the last 72 hours. Thyroid Function Tests: No results for input(s): TSH, T4TOTAL, FREET4, T3FREE, THYROIDAB in the last 72 hours. Anemia Panel: No results for input(s): VITAMINB12, FOLATE, FERRITIN, TIBC, IRON, RETICCTPCT in the last 72 hours. Urine analysis:    Component Value Date/Time   COLORURINE AMBER (A) 01/16/2020 1245   APPEARANCEUR HAZY (A) 01/16/2020 1245   LABSPEC 1.013 01/16/2020 1245   PHURINE 6.0 01/16/2020 1245   GLUCOSEU NEGATIVE 01/16/2020 1245   HGBUR MODERATE (A) 01/16/2020 1245   BILIRUBINUR MODERATE (A) 01/16/2020 1245   BILIRUBINUR large (A) 03/19/2016 1103   BILIRUBINUR small 12/25/2014 1001   KETONESUR NEGATIVE 01/16/2020 1245   PROTEINUR NEGATIVE 01/16/2020 1245   UROBILINOGEN 4.0 03/19/2016 1103   UROBILINOGEN 0.2 01/21/2015 0623   NITRITE NEGATIVE 01/16/2020 1245   LEUKOCYTESUR NEGATIVE  01/16/2020 1245    Radiological Exams on Admission: DG Chest Port 1 View  Result Date: 01/16/2020 CLINICAL DATA:  Weak. Additional provided: Patient reports weakness, abdominal distension that is more than usual, intermittent dizziness when standing, history of liver failure. EXAM: PORTABLE CHEST 1 VIEW COMPARISON:  Prior chest radiograph 01/08/2020 FINDINGS: Borderline cardiomegaly, unchanged. No appreciable airspace consolidation within the lungs. Redemonstrated mild scarring and/or atelectasis within the left lung base. No evidence of pleural effusion or pneumothorax. No acute bony abnormality identified. IMPRESSION: No evidence of acute cardiopulmonary abnormality. Borderline cardiomegaly, unchanged. Redemonstrated mild scarring and/or atelectasis within the left lung base. Electronically Signed   By: Kellie Simmering DO   On: 01/16/2020 13:27    Assessment/Plan Active Problems:   Alcoholic cirrhosis of liver with ascites ([place])   Cirrhosis ([place])   Alcoholic hepatitis with ascites   Severe sepsis ([facility])   AKI (acute kidney injury) ([place])    Severe sepsis: Patient meets sepsis criteria based on leukocytosis, tachycardia as well as tachypnea and lactic acidosis.  The source of infection is unclear as of now.  Patient has abdominal tenderness.  Abdominal x-ray negative.  I would proceed with CT abdomen and pelvis without contrast since he has acute kidney injury.  He may likely have subacute bacterial peritonitis.  I have consulted IR for diagnostic paracentesis and will send the fluid for that.  Patient has received cefepime in the emergency department which I will continue.  Follow blood culture.  His blood pressure is very tenuous.  We will admit him to stepdown unit.  If blood pressure does not improve after 3 units, he may require vasopressors.  I will consult PCCM for that.  Check lactic acid in few hours.  Chronic alcoholic hepatitis with cirrhosis: Worsened LFTs as well as bilirubin.  He was  advised to come the emergency department by [facility].  They are aware of his arrival to the emergency department however I have paged GI for consultation.  Monitor labs every day.  I will resume his home dose of prednisone.  Acute kidney injury: Mildly elevated AKI.  Likely secondary to severe sepsis.  He is going to receive IV fluid bolus of 3 L per sepsis protocol.  We will continue  IV fluids after that.  Tonic microcytic anemia: Likely secondary to alcohol.  Hemoglobin at baseline.  Monitor daily.  Chronic thrombocytopenia: Likely secondary to alcohol.  Numbers are stable.  No active bleeding.  Monitor daily.  History of intermittent upper GI bleed: Hemoglobin is stable at this point in time, no history of black stools, melena or hematochezia or hematemesis.  Will start on Protonix IV twice daily.  CBC in the morning.  DVT prophylaxis: SCD, avoiding heparin products. Code Status: Full code Family Communication: None present at bedside.  Plan of care discussed with patient in length and he verbalized understanding and agreed with it. Disposition Plan: Discharge home when medically stable. Consults called: GI Admission status: Inpatient   Status is: Inpatient  Remains inpatient appropriate because:Inpatient level of care appropriate due to severity of illness   Dispo: The patient is from: Home              Anticipated d/c is to: Home              Anticipated d/c date is: 3 days              Patient currently is not medically stable to d/c.        Darliss Cheney MD [facility]  01/16/2020, 2:38 PM  To contact the attending provider between 7A-7P or the covering provider during after hours 7P-7A, please log into the web site [email or web address]

## 2020-01-16 NOTE — Consult Note (Addendum)
Referring Provider:  [facility]         Primary Care Physician:  Charlott Rakes, MD Primary Gastroenterologist:   Wilfrid Lund, MD           We were asked to see this patient for:    Decompensated cirrhosis              ASSESSMENT /  PLAN         HPI:    Chief Complaint: weakness  Jesse Esparza is a 54 y.o. male with Etoh cirrhosis. He hospitalized here just a few days ago with severe Etoh hepatitis and decompensated cirrhosis. Discriminant function was 91. He was started on Prednisolone with no improvement but steroids were continued and he was discharged home on 01/11/20 with 20 days worth of Prednisolone 40 mg daily. . Encephalopathy was treated with lactulose / Xifaxan BID. He had a LVP without findings of SBP.Coagulopathy treated with Vitamin K.  For volume overload he was started on lasix and aldactone. He underwent EGD for varices screening and also GERD symptoms.   Previous Endoscopic Evaluaton:  01/11/20 EGD  Findings:  Two columns of grade I varices with no bleeding and no stigmata of recent bleeding were found in the distal esophagus. They were 4 mm in largest diameter. No red wale signs were present. The exam of the esophagus was otherwise normal. A large amount of solid food (residue) was found in the gastric fundus and in the gastric body obscuring about 75% of the mucosa. Liquids and semisolids were suctioned. Solids could not be suctioned. Moderate portal hypertensive gastropathy was found in the gastric body and in the gastric antrum. Biopsies were taken with a cold forceps for histology. The exam of the stomach was otherwise normal. The duodenal bulb and second portion of the duodenum were normal. A large amount of food (residue) in the stomach. - Portal hypertensive gastropathy. Biopsied. - Normal duodenal bulb and second portion of the duodenum.  _______________________________________  Unfortunately, Jesse Esparza has felt progressively worse since  discharge, with generalized weakness, and today felt dizzy and had to be transported to the hospital by ambulance.  He is accompanied by his father who is only Spanish-speaking, and [place] translates.  He has generalized abdominal discomfort, feels dyspneic today, lightheaded, denies chest pain. He has not had alcohol since prior to the recent admission.  He keeps asking how bad things are and what he can do to get better.  It does not seem there is been any fever or chills.  He had not noticed any change in his urine, it remains quite dark.  He was concerned because his jaundice was getting worse.  Past Medical History:  Diagnosis Date  . Cirrhosis ([place])   . Depression   . Elevated liver function tests   . Gallbladder sludge   . GERD (gastroesophageal reflux disease)   . Hypercholesteremia   . Hypertension   . Obesity     Past Surgical History:  Procedure Laterality Date  . COLONOSCOPY  2016  . left testicular  childhood  . tumor on right breast  childhood    Prior to Admission medications   Medication Sig Start Date End Date Taking? Authorizing Provider  furosemide (LASIX) 40 MG tablet Take 1 tablet (40 mg total) by mouth daily. 01/12/20 02/11/20 Yes Kyle, Tyrone A, DO  lactulose (CEPHULAC) 20 g packet Take 1 packet (20 g total) by mouth 2 (two) times daily. 01/11/20 02/10/20 Yes Kyle, Tyrone A, DO  pantoprazole (PROTONIX)  40 MG tablet Take 1 tablet (40 mg total) by mouth 2 (two) times daily before a meal. 01/11/20 02/10/20 Yes Kyle, Tyrone A, DO  prednisoLONE 5 MG TABS tablet Take 8 tablets (40 mg total) by mouth daily for 20 days. 01/11/20 01/31/20 Yes Kyle, Tyrone A, DO  rifaximin (XIFAXAN) 550 MG TABS tablet Take 1 tablet (550 mg total) by mouth 2 (two) times daily. 01/11/20 02/10/20 Yes Kyle, Tyrone A, DO  spironolactone (ALDACTONE) 100 MG tablet Take 1 tablet (100 mg total) by mouth daily. 01/12/20 02/11/20 Yes Kyle, Tyrone A, DO  thiamine 100 MG tablet Take 1 tablet (100 mg total) by  mouth daily. 01/12/20 02/11/20 Yes Kyle, Tyrone A, DO    Current Facility-Administered Medications  Medication Dose Route Frequency Provider Last Rate Last Admin  . lidocaine (XYLOCAINE) 1 % (with pres) injection           . 0.9 %  sodium chloride infusion   Intravenous Continuous Pahwani, Ravi, MD      . ceFEPIme (MAXIPIME) 2 g in sodium chloride 0.9 % 100 mL IVPB  2 g Intravenous Q8H Pahwani, Ravi, MD      . iohexol (OMNIPAQUE) 9 MG/ML oral solution           . lactulose (CEPHULAC) packet 20 g  20 g Oral BID Darliss Cheney, MD      . ondansetron (ZOFRAN) tablet 4 mg  4 mg Oral Q6H PRN Darliss Cheney, MD       Or  . ondansetron (ZOFRAN) injection 4 mg  4 mg Intravenous Q6H PRN Darliss Cheney, MD      . pantoprazole (PROTONIX) injection 40 mg  40 mg Intravenous Q12H Pahwani, Einar Grad, MD      . prednisoLONE tablet 40 mg  40 mg Oral Daily Pahwani, Ravi, MD      . rifaximin Doreene Nest) tablet 550 mg  550 mg Oral BID Pahwani, Einar Grad, MD      . sodium chloride flush (NS) 0.9 % injection 3 mL  3 mL Intravenous Q12H Darliss Cheney, MD       Current Outpatient Medications  Medication Sig Dispense Refill  . furosemide (LASIX) 40 MG tablet Take 1 tablet (40 mg total) by mouth daily. 30 tablet 0  . lactulose (CEPHULAC) 20 g packet Take 1 packet (20 g total) by mouth 2 (two) times daily. 60 each 0  . pantoprazole (PROTONIX) 40 MG tablet Take 1 tablet (40 mg total) by mouth 2 (two) times daily before a meal. 60 tablet 0  . prednisoLONE 5 MG TABS tablet Take 8 tablets (40 mg total) by mouth daily for 20 days. 160 tablet 0  . rifaximin (XIFAXAN) 550 MG TABS tablet Take 1 tablet (550 mg total) by mouth 2 (two) times daily. 60 tablet 0  . spironolactone (ALDACTONE) 100 MG tablet Take 1 tablet (100 mg total) by mouth daily. 30 tablet 0  . thiamine 100 MG tablet Take 1 tablet (100 mg total) by mouth daily. 30 tablet 0    Allergies as of 01/16/2020  . (No Known Allergies)    Family History  Problem Relation Age of  Onset  . Hyperlipidemia Father   . Hyperlipidemia Mother   . Colon cancer Neg Hx   . Esophageal cancer Neg Hx   . Rectal cancer Neg Hx   . Stomach cancer Neg Hx     Social History   Socioeconomic History  . Marital status: Divorced    Spouse name: Not on file  .  Number of children: Not on file  . Years of education: Not on file  . Highest education level: Not on file  Occupational History  . Not on file  Tobacco Use  . Smoking status: Never Smoker  . Smokeless tobacco: Never Used  Substance and Sexual Activity  . Alcohol use: Yes    Alcohol/week: 2.0 - 3.0 standard drinks    Types: 2 - 3 Shots of liquor per week    Comment: last drink 6-7 months ago  . Drug use: No  . Sexual activity: Not on file  Other Topics Concern  . Not on file  Social History Narrative  . Not on file   Social Determinants of Health   Financial Resource Strain:   . Difficulty of Paying Living Expenses:   Food Insecurity:   . Worried About Charity fundraiser in the Last Year:   . Arboriculturist in the Last Year:   Transportation Needs:   . Film/video editor (Medical):   Marland Kitchen Lack of Transportation (Non-Medical):   Physical Activity:   . Days of Exercise per Week:   . Minutes of Exercise per Session:   Stress:   . Feeling of Stress :   Social Connections:   . Frequency of Communication with Friends and Family:   . Frequency of Social Gatherings with Friends and Family:   . Attends Religious Services:   . Active Member of Clubs or Organizations:   . Attends Archivist Meetings:   Marland Kitchen Marital Status:   Intimate Partner Violence:   . Fear of Current or Ex-Partner:   . Emotionally Abused:   Marland Kitchen Physically Abused:   . Sexually Abused:     Review of Systems: Peripheral edema All systems reviewed and negative except where noted in HPI.  Physical Exam: Vital signs in last 24 hours: Temp:  [99.7 F (37.6 C)] 99.7 F (37.6 C) (05/19 1205) Pulse Rate:  [95-120] 95 (05/19  1530) Resp:  [17-24] 20 (05/19 1530) BP: (86-101)/(56-63) 95/63 (05/19 1530) SpO2:  [98 %-100 %] 100 % (05/19 1530) Weight:  [108.9 kg] 108.9 kg (05/19 1223)   General: Ill appearing man.  He is clearly fatigued, he is alert and conversational, mildly dyspneic at rest.  Blood pressure was initially in the 80s, now about 95/50, pulse 105  Psych:  Pleasant, cooperative. Normal mood and somewhat blunted affect as when I last saw him. Eyes: Deeply icteric Ears:  Normal auditory acuity. Nose:  No deformity, discharge,  or lesions. Neck:  Supple; no masses Lungs:  Clear throughout to auscultation.  Mild crackles bilateral bases Heart:  Regular rate and rhythm; no murmurs, anasarca Abdomen:  Soft, obese, right upper quadrant tenderness.  Normal bowel sounds. Rectal:  Deferred  Msk:  Symmetrical without gross deformities. . Neurologic:  Alert and  oriented x3  .  Normal gross motor function.  Tremulous, no asterixis Skin:  deeply jaundiced   Intake/Output from previous day: No intake/output data recorded. Intake/Output this shift: Total I/O In: 3099 [IV Piggyback:3099] Out: -   Lab Results: Recent Labs    01/16/20 1226  WBC 17.0*  HGB 11.7*  HCT 34.7*  PLT 37*   BMET Recent Labs    01/16/20 1226  NA 131*  K 4.8  CL 91*  CO2 27  GLUCOSE 97  BUN 45*  CREATININE 1.37*  CALCIUM 8.4*   LFT Recent Labs    01/16/20 1226  PROT 6.5  ALBUMIN 2.3*  AST 209*  ALT 232*  ALKPHOS 228*  BILITOT 36.0*   PT/INR No results for input(s): LABPROT, INR in the last 72 hours. Hepatitis Panel No results for input(s): HEPBSAG, HCVAB, HEPAIGM, HEPBIGM in the last 72 hours.   . CBC Latest Ref Rng & Units 01/16/2020 01/11/2020 01/10/2020  WBC 4.0 - 10.5 K/uL 17.0(H) 17.0(H) 12.7(H)  Hemoglobin 13.0 - 17.0 g/dL 11.7(L) 11.4(L) 10.4(L)  Hematocrit 39.0 - 52.0 % 34.7(L) 33.3(L) 30.2(L)  Platelets 150 - 400 K/uL 37(L) 40(L) 38(L)    . CMP Latest Ref Rng & Units 01/16/2020 01/11/2020  01/10/2020  Glucose 70 - 99 mg/dL 97 110(H) 117(H)  BUN 6 - 20 mg/dL 45(H) 38(H) 38(H)  Creatinine 0.61 - 1.24 mg/dL 1.37(H) 1.06 1.18  Sodium 135 - 145 mmol/L 131(L) 133(L) 137  Potassium 3.5 - 5.1 mmol/L 4.8 5.0 4.4  Chloride 98 - 111 mmol/L 91(L) 96(L) 97(L)  CO2 22 - 32 mmol/L 27 27 29   Calcium 8.9 - 10.3 mg/dL 8.4(L) 8.6(L) 8.8(L)  Total Protein 6.5 - 8.1 g/dL 6.5 6.5 6.3(L)  Total Bilirubin 0.3 - 1.2 mg/dL 36.0(HH) 26.8(HH) 29.9(HH)  Alkaline Phos 38 - 126 U/L 228(H) 166(H) 150(H)  AST 15 - 41 U/L 209(H) 192(H) 168(H)  ALT 0 - 44 U/L 232(H) 174(H) 142(H)   Studies/Results: DG Chest Port 1 View  Result Date: 01/16/2020 CLINICAL DATA:  Weak. Additional provided: Patient reports weakness, abdominal distension that is more than usual, intermittent dizziness when standing, history of liver failure. EXAM: PORTABLE CHEST 1 VIEW COMPARISON:  Prior chest radiograph 01/08/2020 FINDINGS: Borderline cardiomegaly, unchanged. No appreciable airspace consolidation within the lungs. Redemonstrated mild scarring and/or atelectasis within the left lung base. No evidence of pleural effusion or pneumothorax. No acute bony abnormality identified. IMPRESSION: No evidence of acute cardiopulmonary abnormality. Borderline cardiomegaly, unchanged. Redemonstrated mild scarring and/or atelectasis within the left lung base. Electronically Signed   By: Kellie Simmering DO   On: 01/16/2020 13:27    Active Problems:   Alcoholic cirrhosis of liver with ascites ([place])   Cirrhosis ([place])   Alcoholic hepatitis with ascites   Severe sepsis ([place])   AKI (acute kidney injury) ([place])   He has had further decompensation of his liver disease.  Recently admitted with severe acute alcoholic hepatitis on top of underlying cirrhosis causing decompensation.  He has anasarca and had large volume ascites during last week's hospitalization.  He required small volume serial paracentesis with no infection found.  Interventional radiology saw  him today with the intention of diagnostic paracentesis, but said there is no fluid accessible for tap.  Jaundice due to liver failure.  His bilirubin has continued to rise since discharge late last week. Prednisolone for about the last 2 weeks has not helped.  Renal function decreased, he was definitely volume sensitive to paracentesis and diuretics during the most recent hospital stay.  This is a bad sign, especially given the degree of volume overload and hypotension.  I am sure he has ascites, but it is not in discrete fluid collections amenable to paracentesis.  I certainly would not do therapeutic paracentesis even if it were amenable to tap, given his hypotension and renal stress.  Since we cannot do a diagnostic paracentesis, we will put him on empiric antibiotics.  Hepatic encephalopathy stable on current treatment.   I have made it clear to [facility] in no uncertain terms that he is a very sick man, and he might actually not survive this illness.  At this point, I believe all we  can offer for treatment is IV albumin to try and improve his blood pressure and renal perfusion, avoid IV crystalloid fluids because they will only third space, put him on ceftriaxone in case there is SBP, and we will quickly wean him off prednisolone because it is not helping. Unfortunately, he is not a liver transplant candidate with recent alcohol abuse.  If his liver disease continues to deteriorate, he would be appropriate for hospice.  -Albumin 50 g IV every 6x4 was ordered Ceftriaxone 2 g daily was ordered (less overall volume than the cefipime that had been ordered) - PT/INR tomorrow AM (and then daily)  We will follow him with you and hope for the best.

## 2020-01-16 NOTE — Progress Notes (Addendum)
[name], male   DOB: April 24, 1966, 54 y.o.   MRN: [id number] Request received for diagnostic paracentesis on patient.  Limited ultrasound of abdomen in all 4 quadrants reveals only trace amount of ascites with only narrow window to access fluid which is in close proximity to bowel loops and inferior epigastric artery.  Patient already thrombocytopenic and hypotensive.  Findings discussed with [facility] and GI.  Will hold on paracentesis for now. Pt informed as well.

## 2020-01-16 NOTE — ED Provider Notes (Signed)
[facility] DEPT Provider Note   CSN: [id number] Arrival date & time: 01/16/20  1150     History Chief Complaint  Patient presents with  . Weakness  . Abdominal Distention    Jesse Esparza is a 54 y.o. male.  Patient presents with dizziness and weakness.  Patient has severe cirrhosis and recently left the hospital.  The history is provided by the patient. No language interpreter was used.  Weakness Severity:  Moderate Onset quality:  Sudden Timing:  Constant Progression:  Worsening Chronicity:  New Context: not alcohol use   Relieved by:  Nothing Worsened by:  Nothing Ineffective treatments:  None tried Associated symptoms: no abdominal pain, no chest pain, no cough, no diarrhea, no frequency, no headaches and no seizures        Past Medical History:  Diagnosis Date  . Cirrhosis ([place])   . Depression   . Elevated liver function tests   . Gallbladder sludge   . GERD (gastroesophageal reflux disease)   . Hypercholesteremia   . Hypertension   . Obesity     Patient Active Problem List   Diagnosis Date Noted  . Severe sepsis ([place]) 01/16/2020  . AKI (acute kidney injury) ([place]) 01/16/2020  . Abdominal pain, epigastric   . Heartburn   . Encephalopathy, hepatic ([place])   . Alcoholic hepatitis with ascites   . Coagulopathy ([place])   . Portal hypertension ([place])   . Cirrhosis ([place]) 01/01/2020  . Alcoholic cirrhosis of liver with ascites ([place])   . Spinal stenosis of lumbar region 05/04/2016  . Adrenal nodule ([place]) 04/16/2016  . Hyperbilirubinemia 03/31/2016  . GERD (gastroesophageal reflux disease) 03/19/2016  . Essential hypertension, benign 03/13/2014  . Elevated BP 12/14/2013  . Obesity, unspecified 12/14/2013    Past Surgical History:  Procedure Laterality Date  . COLONOSCOPY  2016  . left testicular  childhood  . tumor on right breast  childhood       Family History  Problem Relation Age of Onset  . Hyperlipidemia  Father   . Hyperlipidemia Mother   . Colon cancer Neg Hx   . Esophageal cancer Neg Hx   . Rectal cancer Neg Hx   . Stomach cancer Neg Hx     Social History   Tobacco Use  . Smoking status: Never Smoker  . Smokeless tobacco: Never Used  Substance Use Topics  . Alcohol use: Yes    Alcohol/week: 2.0 - 3.0 standard drinks    Types: 2 - 3 Shots of liquor per week    Comment: last drink 6-7 months ago  . Drug use: No    Home Medications Prior to Admission medications   Medication Sig Start Date End Date Taking? Authorizing Provider  furosemide (LASIX) 40 MG tablet Take 1 tablet (40 mg total) by mouth daily. 01/12/20 02/11/20 Yes Kyle, Tyrone A, DO  lactulose (CEPHULAC) 20 g packet Take 1 packet (20 g total) by mouth 2 (two) times daily. 01/11/20 02/10/20 Yes Kyle, Tyrone A, DO  pantoprazole (PROTONIX) 40 MG tablet Take 1 tablet (40 mg total) by mouth 2 (two) times daily before a meal. 01/11/20 02/10/20 Yes Kyle, Tyrone A, DO  prednisoLONE 5 MG TABS tablet Take 8 tablets (40 mg total) by mouth daily for 20 days. 01/11/20 01/31/20 Yes Kyle, Tyrone A, DO  rifaximin (XIFAXAN) 550 MG TABS tablet Take 1 tablet (550 mg total) by mouth 2 (two) times daily. 01/11/20 02/10/20 Yes Kyle, Tyrone A, DO  spironolactone (ALDACTONE) 100 MG tablet  Take 1 tablet (100 mg total) by mouth daily. 01/12/20 02/11/20 Yes Kyle, Tyrone A, DO  thiamine 100 MG tablet Take 1 tablet (100 mg total) by mouth daily. 01/12/20 02/11/20 Yes Kyle, Tyrone A, DO    Allergies    Patient has no known allergies.  Review of Systems   Review of Systems  Constitutional: Negative for appetite change and fatigue.  HENT: Negative for congestion, ear discharge and sinus pressure.   Eyes: Negative for discharge.  Respiratory: Negative for cough.   Cardiovascular: Negative for chest pain.  Gastrointestinal: Negative for abdominal pain and diarrhea.  Genitourinary: Negative for frequency and hematuria.  Musculoskeletal: Negative for back pain.    Skin: Negative for rash.  Neurological: Positive for weakness. Negative for seizures and headaches.  Psychiatric/Behavioral: Negative for hallucinations.    Physical Exam Updated Vital Signs BP (!) 95/56   Pulse (!) 107   Temp 99.7 F (37.6 C) (Oral)   Resp 19   Ht 5\' 5"  (1.651 m)   Wt 108.9 kg   SpO2 98%   BMI 39.94 kg/m   Physical Exam Vitals and nursing note reviewed.  Constitutional:      Appearance: He is well-developed.  HENT:     Head: Normocephalic.     Nose: Nose normal.  Eyes:     General: No scleral icterus.    Conjunctiva/sclera: Conjunctivae normal.     Comments: Patient has icteric eyes  Neck:     Thyroid: No thyromegaly.  Cardiovascular:     Rate and Rhythm: Normal rate and regular rhythm.     Heart sounds: No murmur. No friction rub. No gallop.   Pulmonary:     Breath sounds: No stridor. No wheezing or rales.  Chest:     Chest wall: No tenderness.  Abdominal:     General: There is no distension.     Tenderness: There is no abdominal tenderness. There is no rebound.  Musculoskeletal:        General: Normal range of motion.     Cervical back: Neck supple.  Lymphadenopathy:     Cervical: No cervical adenopathy.  Skin:    Findings: No erythema or rash.     Comments: Patient severe jaundice  Neurological:     Mental Status: He is alert and oriented to person, place, and time.     Motor: No abnormal muscle tone.     Coordination: Coordination normal.  Psychiatric:        Behavior: Behavior normal.     ED Results / Procedures / Treatments   Labs (all labs ordered are listed, but only abnormal results are displayed) Labs Reviewed  LACTIC ACID, PLASMA - Abnormal; Notable for the following components:      Result Value   Lactic Acid, Venous 3.1 (*)    All other components within normal limits  COMPREHENSIVE METABOLIC PANEL - Abnormal; Notable for the following components:   Sodium 131 (*)    Chloride 91 (*)    BUN 45 (*)    Creatinine, Ser  1.37 (*)    Calcium 8.4 (*)    Albumin 2.3 (*)    AST 209 (*)    ALT 232 (*)    Alkaline Phosphatase 228 (*)    Total Bilirubin 36.0 (*)    GFR calc non Af Amer 58 (*)    All other components within normal limits  CBC WITH DIFFERENTIAL/PLATELET - Abnormal; Notable for the following components:   WBC 17.0 (*)  RBC 3.11 (*)    Hemoglobin 11.7 (*)    HCT 34.7 (*)    MCV 111.6 (*)    MCH 37.6 (*)    RDW 17.5 (*)    Platelets 37 (*)    Neutro Abs 15.0 (*)    Lymphs Abs 0.5 (*)    Abs Immature Granulocytes 0.50 (*)    All other components within normal limits  URINALYSIS, ROUTINE W REFLEX MICROSCOPIC - Abnormal; Notable for the following components:   Color, Urine AMBER (*)    APPearance HAZY (*)    Hgb urine dipstick MODERATE (*)    Bilirubin Urine MODERATE (*)    Bacteria, UA MANY (*)    All other components within normal limits  SARS CORONAVIRUS 2 BY RT PCR (HOSPITAL ORDER, [facility])  CULTURE, BLOOD (ROUTINE X 2)  CULTURE, BLOOD (ROUTINE X 2)  URINE CULTURE  LACTIC ACID, PLASMA  APTT  PROTIME-INR  OCCULT BLOOD X 1 CARD TO LAB, STOOL    EKG None  Radiology DG Chest Port 1 View  Result Date: 01/16/2020 CLINICAL DATA:  Weak. Additional provided: Patient reports weakness, abdominal distension that is more than usual, intermittent dizziness when standing, history of liver failure. EXAM: PORTABLE CHEST 1 VIEW COMPARISON:  Prior chest radiograph 01/08/2020 FINDINGS: Borderline cardiomegaly, unchanged. No appreciable airspace consolidation within the lungs. Redemonstrated mild scarring and/or atelectasis within the left lung base. No evidence of pleural effusion or pneumothorax. No acute bony abnormality identified. IMPRESSION: No evidence of acute cardiopulmonary abnormality. Borderline cardiomegaly, unchanged. Redemonstrated mild scarring and/or atelectasis within the left lung base. Electronically Signed   By: Kellie Simmering DO   On: 01/16/2020 13:27      Procedures Procedures (including critical care time)  Medications Ordered in ED Medications  iohexol (OMNIPAQUE) 9 MG/ML oral solution (has no administration in time range)  ceFEPIme (MAXIPIME) 2 g in sodium chloride 0.9 % 100 mL IVPB (has no administration in time range)  sodium chloride flush (NS) 0.9 % injection 3 mL (3 mLs Intravenous Given 01/16/20 1237)  sodium chloride 0.9 % bolus 1,000 mL (0 mLs Intravenous Stopped 01/16/20 1447)  sodium chloride 0.9 % bolus 2,000 mL (2,000 mLs Intravenous New Bag/Given 01/16/20 1303)  ceFEPIme (MAXIPIME) 2 g in sodium chloride 0.9 % 100 mL IVPB (0 g Intravenous Stopped 01/16/20 1348)    ED Course  I have reviewed the triage vital signs and the nursing notes.  Pertinent labs & imaging results that were available during my care of the patient were reviewed by me and considered in my medical decision making (see chart for details). CRITICAL CARE Performed by: Milton Ferguson Total critical care time: 40 minutes Critical care time was exclusive of separately billable procedures and treating other patients. Critical care was necessary to treat or prevent imminent or life-threatening deterioration. Critical care was time spent personally by me on the following activities: development of treatment plan with patient and/or surrogate as well as nursing, discussions with consultants, evaluation of patient's response to treatment, examination of patient, obtaining history from patient or surrogate, ordering and performing treatments and interventions, ordering and review of laboratory studies, ordering and review of radiographic studies, pulse oximetry and re-evaluation of patient's condition.    MDM Rules/Calculators/A&P                      Patient with sepsis and hypotension.  Patient has been cultured appropriately and given antibiotics and fluids.  Critical care  was consulted and felt like the patient can be admitted to stepdown through medicine but  medicine monitor critical care to see the patient then medicine can consult them..      This patient presents to the ED for concern of weakness, this involves an extensive number of treatment options, and is a complaint that carries with it a high risk of complications and morbidity.  The differential diagnosis includes sepsis dehydration   Lab Tests:   I Ordered, reviewed, and interpreted labs, which included CBC and chemistries which shows elevated white count and bilirubin greater than 30  Medicines ordered:   I ordered medication IV fluids for dehydration and sepsis along with antibiotics  Imaging Studies ordered:   I ordered imaging studies which included chest x-ray and  I independently visualized and interpreted imaging which showed no acute disease  Additional history obtained:   Additional history obtained from relative  Previous records obtained and reviewed  Consultations Obtained:   I consulted critical care and hospitalist and discussed lab and imaging findings  Reevaluation:  After the interventions stated above, I reevaluated the patient and found mild improvement  Critical Interventions:  .   Final Clinical Impression(s) / ED Diagnoses Final diagnoses:  Abdominal pain    Rx / DC Orders ED Discharge Orders    None       Milton Ferguson, MD 01/16/20 1456

## 2020-01-16 NOTE — Telephone Encounter (Signed)
Spoke with pt and informed about labs and appt on 6/1. He agreed with plan.

## 2020-01-16 NOTE — ED Notes (Signed)
Per Doristine Bosworth, MD paracentesis is for diagnostic. MD made aware of patients BP and 3 liters NS complete.

## 2020-01-16 NOTE — ED Notes (Addendum)
Attempted to call [facility]. Spoke with christian and RN will need to call this RN back.

## 2020-01-16 NOTE — Telephone Encounter (Signed)
This is a patient of mine just discharged from [facility] late last week with severe EtOH hepatitis causing decompensation of underlying cirrhosis.  Called office today feeling weak, coming to [facility] by ambulance.  Let me know when you hear about him and I can see him since I will be over there.

## 2020-01-16 NOTE — ED Notes (Signed)
Pt transported to CT ?

## 2020-01-16 NOTE — Telephone Encounter (Signed)
Hi Linda, pt just called to report that he has been feeling very weak and dizzy He stated that he has lack of appetite and has only had orange juice today. He also mentioned that his feet are very swollen. He was given several medications at the hospital: Vit B1, spironolactone, xifaxan, fursosemide, pantoprazole, prednisolone, and lactulose. He wants to know if he needs to continue taking all of them and would also like some advise for his sxs.

## 2020-01-16 NOTE — Telephone Encounter (Signed)
I am aware - someone from our inpatient consult team will see him today when they are available.

## 2020-01-16 NOTE — Telephone Encounter (Signed)
Dr. Loletha Carrow was notified that pt was being transported by ambulance to the ED. See note below.

## 2020-01-16 NOTE — Telephone Encounter (Signed)
Pt had told us he was going to try to come for labs today. Now he has called in to let us know he is going by ambulance to the hospital. Dr. Loletha Carrow notified.

## 2020-01-17 ENCOUNTER — Other Ambulatory Visit: Payer: Self-pay

## 2020-01-17 ENCOUNTER — Ambulatory Visit: Payer: Self-pay

## 2020-01-17 ENCOUNTER — Encounter: Payer: Self-pay | Admitting: *Deleted

## 2020-01-17 DIAGNOSIS — D696 Thrombocytopenia, unspecified: Secondary | ICD-10-CM

## 2020-01-17 DIAGNOSIS — D638 Anemia in other chronic diseases classified elsewhere: Secondary | ICD-10-CM

## 2020-01-17 LAB — CBC
HCT: 23.9 % — ABNORMAL LOW (ref 39.0–52.0)
Hemoglobin: 7.9 g/dL — ABNORMAL LOW (ref 13.0–17.0)
MCH: 37.8 pg — ABNORMAL HIGH (ref 26.0–34.0)
MCHC: 33.1 g/dL (ref 30.0–36.0)
MCV: 114.4 fL — ABNORMAL HIGH (ref 80.0–100.0)
Platelets: 21 10*3/uL — CL (ref 150–400)
RBC: 2.09 MIL/uL — ABNORMAL LOW (ref 4.22–5.81)
RDW: 17.3 % — ABNORMAL HIGH (ref 11.5–15.5)
WBC: 10.7 10*3/uL — ABNORMAL HIGH (ref 4.0–10.5)
nRBC: 0 % (ref 0.0–0.2)

## 2020-01-17 LAB — COMPREHENSIVE METABOLIC PANEL
ALT: 173 U/L — ABNORMAL HIGH (ref 0–44)
AST: 168 U/L — ABNORMAL HIGH (ref 15–41)
Albumin: 3.1 g/dL — ABNORMAL LOW (ref 3.5–5.0)
Alkaline Phosphatase: 104 U/L (ref 38–126)
Anion gap: 10 (ref 5–15)
BUN: 41 mg/dL — ABNORMAL HIGH (ref 6–20)
CO2: 24 mmol/L (ref 22–32)
Calcium: 7.8 mg/dL — ABNORMAL LOW (ref 8.9–10.3)
Chloride: 96 mmol/L — ABNORMAL LOW (ref 98–111)
Creatinine, Ser: 0.77 mg/dL (ref 0.61–1.24)
GFR calc Af Amer: >60 mL/min (ref >60)
GFR calc non Af Amer: >60 mL/min (ref >60)
Glucose, Bld: 119 mg/dL — ABNORMAL HIGH (ref 70–99)
Potassium: 4.5 mmol/L (ref 3.5–5.1)
Sodium: 130 mmol/L — ABNORMAL LOW (ref 135–145)
Total Bilirubin: 29.5 mg/dL (ref 0.3–1.2)
Total Protein: 5.9 g/dL — ABNORMAL LOW (ref 6.5–8.1)

## 2020-01-17 LAB — BLOOD CULTURE ID PANEL (REFLEXED)

## 2020-01-17 LAB — PROTIME-INR
INR: 3.5 — ABNORMAL HIGH (ref 0.8–1.2)
Prothrombin Time: 34.4 seconds — ABNORMAL HIGH (ref 11.4–15.2)

## 2020-01-17 LAB — TYPE AND SCREEN
ABO/RH(D): O NEG
Antibody Screen: NEGATIVE

## 2020-01-17 LAB — HEMOGLOBIN AND HEMATOCRIT, BLOOD
HCT: 23.4 % — ABNORMAL LOW (ref 39.0–52.0)
HCT: 23.6 % — ABNORMAL LOW (ref 39.0–52.0)
Hemoglobin: 7.9 g/dL — ABNORMAL LOW (ref 13.0–17.0)
Hemoglobin: 7.8 g/dL — ABNORMAL LOW (ref 13.0–17.0)

## 2020-01-17 LAB — ABO/RH: ABO/RH(D): O NEG

## 2020-01-17 MED ORDER — ORAL CARE MOUTH RINSE
15.0000 mL | Freq: Two times a day (BID) | OROMUCOSAL | Status: DC
  Administered 2020-01-17 – 2020-01-22 (×11): 15 mL via OROMUCOSAL

## 2020-01-17 MED ORDER — DOCUSATE SODIUM 100 MG PO CAPS
100.0000 mg | ORAL_CAPSULE | Freq: Two times a day (BID) | ORAL | Status: DC
  Administered 2020-01-17 – 2020-01-21 (×9): 100 mg via ORAL
  Filled 2020-01-17 (×9): qty 1

## 2020-01-17 MED ORDER — PANTOPRAZOLE SODIUM 40 MG PO TBEC
40.0000 mg | DELAYED_RELEASE_TABLET | Freq: Two times a day (BID) | ORAL | Status: DC
  Administered 2020-01-17 – 2020-01-22 (×10): 40 mg via ORAL
  Filled 2020-01-17 (×10): qty 1

## 2020-01-17 MED ORDER — LACTULOSE 10 GM/15ML PO SOLN
20.0000 g | Freq: Three times a day (TID) | ORAL | Status: DC
  Administered 2020-01-17 – 2020-01-22 (×14): 20 g via ORAL
  Filled 2020-01-17 (×14): qty 30

## 2020-01-17 MED ORDER — ADULT MULTIVITAMIN W/MINERALS CH
1.0000 | ORAL_TABLET | Freq: Every day | ORAL | Status: DC
  Administered 2020-01-17 – 2020-01-22 (×6): 1 via ORAL
  Filled 2020-01-17 (×6): qty 1

## 2020-01-17 MED ORDER — PRO-STAT SUGAR FREE PO LIQD
30.0000 mL | Freq: Three times a day (TID) | ORAL | Status: DC
  Administered 2020-01-17 – 2020-01-22 (×15): 30 mL via ORAL
  Filled 2020-01-17 (×15): qty 30

## 2020-01-17 MED ORDER — ALBUMIN HUMAN 25 % IV SOLN
25.0000 g | Freq: Four times a day (QID) | INTRAVENOUS | Status: AC
  Administered 2020-01-17 – 2020-01-18 (×4): 25 g via INTRAVENOUS
  Filled 2020-01-17 (×3): qty 100

## 2020-01-17 NOTE — Progress Notes (Signed)
PHARMACY - PHYSICIAN COMMUNICATION CRITICAL VALUE ALERT - BLOOD CULTURE IDENTIFICATION (BCID)  Jesse Esparza is an 54 y.o. male who presented to [facility] on 01/16/2020 with a chief complaint of weakness.  Assessment:  Patient in ICU and BCID resulted below. (include suspected source if known)  Name of physician (or Provider) Contacted: Dr. Lucile Shutters  Current antibiotics: Ceftriaxone 2gm iv q24hr  Changes to prescribed antibiotics recommended:  Patient is on recommended antibiotics - No changes needed  Results for orders placed or performed during the hospital encounter of 01/16/20  Blood Culture ID Panel (Reflexed) (Collected: 01/16/2020 12:40 PM)  Result Value Ref Range   Enterococcus species NOT DETECTED NOT DETECTED   Listeria monocytogenes NOT DETECTED NOT DETECTED   Staphylococcus species NOT DETECTED NOT DETECTED   Staphylococcus aureus (BCID) NOT DETECTED NOT DETECTED   Streptococcus species NOT DETECTED NOT DETECTED   Streptococcus agalactiae NOT DETECTED NOT DETECTED   Streptococcus pneumoniae NOT DETECTED NOT DETECTED   Streptococcus pyogenes NOT DETECTED NOT DETECTED   Acinetobacter baumannii NOT DETECTED NOT DETECTED   Enterobacteriaceae species DETECTED (A) NOT DETECTED   Enterobacter cloacae complex NOT DETECTED NOT DETECTED   Escherichia coli DETECTED (A) NOT DETECTED   Klebsiella oxytoca NOT DETECTED NOT DETECTED   Klebsiella pneumoniae NOT DETECTED NOT DETECTED   Proteus species NOT DETECTED NOT DETECTED   Serratia marcescens NOT DETECTED NOT DETECTED   Carbapenem resistance NOT DETECTED NOT DETECTED   Haemophilus influenzae NOT DETECTED NOT DETECTED   Neisseria meningitidis NOT DETECTED NOT DETECTED   Pseudomonas aeruginosa NOT DETECTED NOT DETECTED   Candida albicans NOT DETECTED NOT DETECTED   Candida glabrata NOT DETECTED NOT DETECTED   Candida krusei NOT DETECTED NOT DETECTED   Candida parapsilosis NOT DETECTED NOT DETECTED   Candida tropicalis NOT  DETECTED NOT DETECTED    Nani Skillern Crowford 01/17/2020  5:34 AM

## 2020-01-17 NOTE — Progress Notes (Signed)
Initial Nutrition Assessment  RD working remotely.   DOCUMENTATION CODES:   Obesity unspecified  INTERVENTION:  - will order 30 ml prostat TID, each supplement provides 100 kcal and 15 grams of protein. - will order 1 tablet multivitamin with minerals/day.  NUTRITION DIAGNOSIS:   Increased nutrient needs related to acute illness as evidenced by estimated needs.  GOAL:   Patient will meet greater than or equal to 90% of their needs  MONITOR:   PO intake, Supplement acceptance, Labs, Weight trends  REASON FOR ASSESSMENT:   Malnutrition Screening Tool  ASSESSMENT:   54 y.o. male with medical history of alcoholic hepatitis and cirrhosis, GERD, hyperlipidemia, HTN, and obesity. He was hospitalized 5/4-5/14 for acute alcoholic hepatitis s/p paracentesis on 5/5 with 1.4L removed. He called the GI office on 5/19 stating sudden onset of severe weakness, dizziness, and abdominal pain. He was advised to present to the ED.  Diet advanced from NPO to 2 gram Na yesterday at 1710 with no intakes documented since that time. Patient has not been seen by a [place] RD at any time in the past.   Per chart review, weight yesterday and today is 240 lb. Weight on 01/11/20 was 253 lb, which is the highest recorded weight in at least 3.5 years. Flow sheet documentation indicates mild to moderate edema to all extremities.  Per notes: -severe sepsis - AKI - chronic alcoholic hepatitis with cirrhosis - tonic microcytic anemia thought to be 2/2 alcohol abuse   Labs reviewed; Na: 130 mmol/l, Cl: 96 mg/dl, BUN: 41 mg/dl, Ca: 7.8 mg/dl, LFTs elevated. Medications reviewed; 50 g albumin x4 doses 5/19, 40 mg IV protonix BID, prednisolone taper 5/20-5/23.    NUTRITION - FOCUSED PHYSICAL EXAM:  unable to complete at this time.   Diet Order:   Diet Order            Diet 2 gram sodium Room service appropriate? Yes; Fluid consistency: Thin  Diet effective now              EDUCATION NEEDS:    Not appropriate for education at this time  Skin:  Skin Assessment: Reviewed RN Assessment  Last BM:  PTA/unknown  Height:   Ht Readings from Last 1 Encounters:  01/16/20 5\' 5"  (1.651 m)    Weight:   Wt Readings from Last 1 Encounters:  01/17/20 108.8 kg    Ideal Body Weight:     BMI:  Body mass index is 39.91 kg/m.  Estimated Nutritional Needs:   Kcal:  1800-2000 kcal  Protein:  80-90 grams  Fluid:  >/= 1.5 L/day     Jarome Matin, MS, RD, LDN, CNSC Inpatient Clinical Dietitian RD pager # available in [facility]  After hours/weekend pager # available in [facility]

## 2020-01-17 NOTE — Progress Notes (Addendum)
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  --Admitted through ED yesterday with weakness, hypotension, elevated lactic acid. Workup for septic shock is in progress. No SBP during last admission a few days ago. Insufficient ascites to tap yesterday per IR making SBP unlikely. Also only small amount of ascites on CT scan. Regardless, Jesse Esparza is getting Rocephin.  I will ask Dr. Ardis Hughs to review Jesse Esparza's non-contrast CT scan done yesterday afternoon as there are some unusual findings including pneumatosis in the ascending colon, with a few adjacent foci of extraluminal free air. Small amount of mesenteric venous gas is suspected in the region of pneumatosis. However, there is no significant colonic wall thickening or inflammatory change in the region of pneumatosis and extraluminal air.  --WBC is down from 17 to 10.7. --LFT better overnight ( but got aggressive IV fluids). Unfortunately INR up form 2.6 >>> 3.5 --Anasarca worse today but not unexpected after IV bolus x 3 in ED yesterday and IV fluids during the night. IV fluids have been discontinued.  --Though volume overloaded, no appreciable ascites yesterday per IR and abdomen looks about the same today.  --Getting IV Albumin Q 6 x 4. Renal function has recovered. .  --Diuretics on hold due to hypotension and AKI . Renal function recovered, hypotension resolved. Probably will resume diuretics in am.  --Low sodium diet.  --Sister, daughter and Jesse Esparza had several questions about prognosis.We discussed asking Palliative Medicine to see this admission, especially if condition worsens and they are all open to the idea . Jesse Esparza and family understand that overall prognosis is poor   # Acute anemia --Hgb 11.7 >>> 7.9 but after aggressive IVF so likely dilutional. No overt GI bleeding   SUBJECTIVE   Jesse Esparza feels and actually looks a little better today. Still  having intermittent diffuse abdominal discomfort but tolerating solids    OBJECTIVE:     Vital signs in last 24 hours: Temp:  [97.6 F (36.4 C)-99.7 F (37.6 C)] 98.2 F (36.8 C) (05/20 0800) Pulse Rate:  [81-120] 97 (05/20 1000) Resp:  [15-24] 20 (05/20 1000) BP: (86-129)/(43-79) 115/43 (05/20 1000) SpO2:  [96 %-100 %] 99 % (05/20 1000) Weight:  [108.8 kg-108.9 kg] 108.8 kg (05/20 0500)   General:   Alert, in NAD. Sister and daughter at bedside Heart:  Regular rate and rhythm.  Pitting edema of BLE   Pulm: Normal respiratory effort. Mild diminished breath sound RLL   Abdomen:  Soft,  Nondistended, mild-mod mid abdominal tenderness.  Normal bowel sounds.  Neurologic:  Alert and  oriented,  grossly normal neurologically. Psych:  Pleasant, cooperative.  Normal mood and affect.   Intake/Output from previous day: 05/19 0701 - 05/20 0700 In: 3099 [IV Piggyback:3099] Out: 950 [Urine:950] Intake/Output this shift: No intake/output data recorded.  Lab Results: Recent Labs    01/16/20 1226 01/17/20 0253 01/17/20 0908  WBC 17.0* 10.7*  --   HGB 11.7* 7.9* 7.9*  HCT 34.7* 23.9* 23.4*  PLT 37* 21*  --    BMET Recent Labs    01/16/20 1226 01/17/20 0253  NA 131* 130*  K 4.8 4.5  CL 91* 96*  CO2 27 24  GLUCOSE 97 119*  BUN 45* 41*  CREATININE 1.37* 0.77  CALCIUM 8.4* 7.8*   LFT Recent Labs    01/17/20 0253  PROT 5.9*  ALBUMIN 3.1*  AST 168*  ALT 173*  ALKPHOS 104  BILITOT 29.5*   PT/INR Recent Labs    01/16/20 1226 01/17/20 0253  LABPROT 27.3* 34.4*  INR 2.6* 3.5*   Hepatitis Panel No results for input(s): HEPBSAG, HCVAB, HEPAIGM, HEPBIGM in the last 72 hours.  CT ABDOMEN PELVIS WO CONTRAST  Result Date: 01/16/2020 CLINICAL DATA:  Abdominal pain and distension. History of cirrhosis. EXAM: CT ABDOMEN AND PELVIS WITHOUT CONTRAST TECHNIQUE: Multidetector CT imaging of the abdomen and pelvis was performed following the standard protocol without IV  contrast. COMPARISON:  Most recent CT fifteen days ago 01/01/2020. FINDINGS: Lower chest: Trace right pleural effusion/thickening. Rounded density in the lower most right lung base is new from prior exam and likely atelectasis, series 6, image 15. Hepatobiliary: Cirrhotic hepatic morphology with nodular contours. No obvious focal lesion on noncontrast exam. Small gallstones within the gallbladder. No biliary dilatation. Pancreas: No ductal dilatation or inflammation. Spleen: Splenomegaly with spleen measuring 14 x 6.8 x 14.6 cm (volume = 730 cm^3). Findings are grossly stable allowing for differences in caliper placement. Adrenals/Urinary Tract: Minimal thickening of the right adrenal gland without dominant nodule. No hydronephrosis. No renal or ureteral calculi. Urinary bladder is physiologically distended. Stomach/Bowel: Stomach is unremarkable. Administered enteric contrast reaches the mid distal small bowel. Mild wall thickening of the duodenum and proximal small bowel loops. No obstruction. Portions of normal appendix are visualized, partially obscured by adjacent ascites. There is mottled air in the ascending colon suspicious for pneumatosis, with a few adjacent foci of extraluminal air, for example series 2, image 48 and 47. Small focus of air centrally may be within the mesenteric veins versus mesenteric fat, series 2, image 46. there is no definite associated colonic wall thickening or pericolonic edema. Formed stool throughout the more distal colon with scattered diverticula. No focal diverticulitis. Vascular/Lymphatic: Aortic atherosclerosis. Perisplenic collaterals with splenorenal shunting were better appreciated on prior contrast-enhanced exam. Tiny focus of air in the right mesentery which may be intravascular versus in the mesenteric fat, series 2, image 46. possible additional tiny mesenteric venous focus in the central abdomen, series 2, image 37. Prominent periportal lymph nodes which are likely  reactive. Reproductive: Stable prostate gland. Other: Small volume abdominopelvic ascites, greatest volume in the right upper and lower quadrants. Tiny foci of free air adjacent to the ascending colon in the right abdomen. No evidence of intra-abdominal abscess. There is a small fat containing umbilical hernia. Musculoskeletal: Degenerative disc disease and facet hypertrophy of the lumbar spine. There are no acute or suspicious osseous abnormalities. IMPRESSION: 1. Findings suspicious for pneumatosis in the ascending colon, with a few adjacent foci of extraluminal free air. Small amount of mesenteric venous gas is suspected in the region of pneumatosis. However, there is no significant colonic wall thickening or inflammatory change in the region of pneumatosis and extraluminal air. This may be benign pneumatosis, and free air in the absence of bowel perforation has been described, however this is indeterminate on imaging. Close clinical follow-up is recommended. 2. Mild wall thickening of the duodenum and proximal small bowel loops, likely reactive. 3. Cirrhosis with portal hypertension, splenomegaly, and small volume abdominopelvic ascites. 4. Cholelithiasis. Aortic Atherosclerosis (ICD10-I70.0). These results were called by telephone at the time of interpretation on 01/16/2020 at 5:34 pm to Dr Darliss Cheney , who verbally acknowledged these results. Electronically Signed   By: Keith Rake M.D.   On: 01/16/2020 17:37   US Abdomen Limited  Result Date: 01/16/2020 CLINICAL DATA:  History of cirrhosis. Please perform ascites search ultrasound  and ultrasound-guided paracentesis as indicated. EXAM: LIMITED ABDOMEN ULTRASOUND FOR ASCITES TECHNIQUE: Limited ultrasound survey for ascites was performed in all four abdominal quadrants. COMPARISON:  Ultrasound-guided paracentesis-01/08/2020 (yielding 1.2 L peritoneal fluid) FINDINGS: Sonographic evaluation of the abdomen demonstrates a trace amount intra-abdominal  ascites within the bilateral lower abdominal quadrants, too small to allow for safe ultrasound-guided paracentesis. IMPRESSION: Trace amount of intra-abdominal ascites, too small to allow for safe ultrasound-guided paracentesis. No paracentesis attempted. Electronically Signed   By: Sandi Mariscal M.D.   On: 01/16/2020 16:55   DG Chest Port 1 View  Result Date: 01/16/2020 CLINICAL DATA:  Weak. Additional provided: Jesse Esparza reports weakness, abdominal distension that is more than usual, intermittent dizziness when standing, history of liver failure. EXAM: PORTABLE CHEST 1 VIEW COMPARISON:  Prior chest radiograph 01/08/2020 FINDINGS: Borderline cardiomegaly, unchanged. No appreciable airspace consolidation within the lungs. Redemonstrated mild scarring and/or atelectasis within the left lung base. No evidence of pleural effusion or pneumothorax. No acute bony abnormality identified. IMPRESSION: No evidence of acute cardiopulmonary abnormality. Borderline cardiomegaly, unchanged. Redemonstrated mild scarring and/or atelectasis within the left lung base. Electronically Signed   By: Kellie Simmering DO   On: 01/16/2020 13:27   Tye Savoy ,NP 01/17/2020, 11:35 AM   ________________________________________________________________________  Velora Heckler GI MD note:  I personally examined the Jesse Esparza, reviewed the data and agree with the assessment and plan described above.  His liver is failing.  I think palliative care consult would be helpful.  The bowel changes on CT are moot because Jesse Esparza is absolutely not a surgical candidate.    Will follow along.   Owens Loffler, MD [facility] Pager [phone]

## 2020-01-17 NOTE — TOC Initial Note (Signed)
Transition of Care [facility]) - Initial/Assessment Note    Patient Details  Name: Jesse Esparza MRN: [id number] Date of Birth: 02/25/1966  [facility]) CM/SW Contact:    Leeroy Cha, RN Phone Number: 01/17/2020, 7:49 AM  Clinical Narrative:                 From home has pcp from [facility], hx of etoh cirrhosis, total bilirunbin 29.5, bld culutres x2 + gnr, hypotensive upon arrival given 3 liter boluses in ed,iv rocephin,iv albumin x2 today then dc.  Expected Discharge Plan: Home/Self Care Barriers to Discharge: Continued Medical Work up   Patient Goals and CMS Choice Patient states their goals for this hospitalization and ongoing recovery are:: to go home CMS Medicare.gov Compare Post Acute Care list provided to:: Patient    Expected Discharge Plan and Services Expected Discharge Plan: Home/Self Care   Discharge Planning Services: CM Consult   Living arrangements for the past 2 months: Single Family Home                                      Prior Living Arrangements/Services Living arrangements for the past 2 months: Single Family Home Lives with:: Self   Do you feel safe going back to the place where you live?: Yes      Need for Family Participation in Patient Care: Yes (Comment) Care giver support system in place?: Yes (comment)   Criminal Activity/Legal Involvement Pertinent to Current Situation/Hospitalization: No - Comment as needed  Activities of Daily Living Home Assistive Devices/Equipment: None ADL Screening (condition at time of admission) Patient's cognitive ability adequate to safely complete daily activities?: No Is the patient deaf or have difficulty hearing?: No Does the patient have difficulty seeing, even when wearing glasses/contacts?: No Does the patient have difficulty concentrating, remembering, or making decisions?: Yes Patient able to express need for assistance with ADLs?: Yes Does the patient have difficulty dressing or  bathing?: Yes Independently performs ADLs?: No Communication: Independent Dressing (OT): Needs assistance Is this a change from baseline?: Change from baseline, expected to last >3 days Grooming: Independent Feeding: Independent Bathing: Needs assistance Is this a change from baseline?: Change from baseline, expected to last >3 days Toileting: Needs assistance Is this a change from baseline?: Change from baseline, expected to last >3days In/Out Bed: Needs assistance Is this a change from baseline?: Change from baseline, expected to last >3 days Walks in Home: Needs assistance Is this a change from baseline?: Change from baseline, expected to last >3 days Does the patient have difficulty walking or climbing stairs?: Yes Weakness of Legs: Both Weakness of Arms/Hands: Both  Permission Sought/Granted                  Emotional Assessment Appearance:: Appears stated age     Orientation: : Oriented to Self, Oriented to Place, Oriented to  Time, Oriented to Situation Alcohol / Substance Use: Alcohol Use Psych Involvement: No (comment)  Admission diagnosis:  Abdominal pain [R10.9] Sepsis ([place]) [A41.9] Patient Active Problem List   Diagnosis Date Noted  . Severe sepsis ([place]) 01/16/2020  . AKI (acute kidney injury) ([place]) 01/16/2020  . Abdominal pain, epigastric   . Heartburn   . Encephalopathy, hepatic ([place])   . Alcoholic hepatitis with ascites   . Coagulopathy ([place])   . Portal hypertension ([place])   . Cirrhosis ([place]) 01/01/2020  . Alcoholic cirrhosis of liver with  ascites ([place])   . Spinal stenosis of lumbar region 05/04/2016  . Adrenal nodule ([place]) 04/16/2016  . Hyperbilirubinemia 03/31/2016  . GERD (gastroesophageal reflux disease) 03/19/2016  . Essential hypertension, benign 03/13/2014  . Elevated BP 12/14/2013  . Obesity, unspecified 12/14/2013   PCP:  Charlott Rakes, MD Pharmacy:   [id number] [place], [place] [address] [place] [address] Phone: [phone] Fax: [place], [address] [place] [address] Phone: [phone] Fax: [phone]     Social Determinants of Health (SDOH) Interventions    Readmission Risk Interventions No flowsheet data found.

## 2020-01-17 NOTE — Progress Notes (Signed)
NAME:  Jesse Esparza, MRN:  [id number], DOB:  12/11/1965, LOS: 1 ADMISSION DATE:  01/16/2020, CONSULTATION DATE:  5/19 REFERRING MD:  Doristine Bosworth, CHIEF COMPLAINT:  Shock   Brief History   54 y/o admitted for dizziness and weakness related to advanced alcoholic cirrhosis; PCCM consulted for hypotension despite volume resuscitation.  Past Medical History  Alcoholic cirrhosis with ascites Gallbladder sludge GERD Hypercholesterolemia Hypertension Obesity  Significant Hospital Events   5/19 admission, hypotensive, PCCM consult  Consults:  PCCM GI  Procedures:    Significant Diagnostic Tests:    Micro Data:  Blood cultures 5/19 Urine culture 5/19  Antimicrobials:  Ceftriaxone 5/19>    Interim history/subjective:  Feels better this morning Has appetite Blood pressure improved with fluids/albumin  Objective   Blood pressure 112/69, pulse 86, temperature 97.6 F (36.4 C), temperature source Oral, resp. rate 19, height 5\' 5"  (1.651 m), weight 108.8 kg, SpO2 98 %.        Intake/Output Summary (Last 24 hours) at 01/17/2020 0741 Last data filed at 01/17/2020 0600 Gross per 24 hour  Intake 3099 ml  Output 950 ml  Net 2149 ml   Filed Weights   01/16/20 1223 01/17/20 0500  Weight: 108.9 kg 108.8 kg    Examination: General:  Resting comfortably in bed HENT: NCAT OP clear, scleral icterus PULM: CTA B, normal effort CV: RRR, no mgr GI: BS+, soft, nontender MSK: normal bulk and tone Derm: jaundice Neuro: awake, alert, no distress, MAEW   Resolved Hospital Problem list     Assessment & Plan:  Septic shock : resolved with fluid/albumin resuscitation, unclear source > continue ceftriaxone  > monitor cultures > monitor hemodynamics  Alcoholic cirrhosis with decompensation > f/u GI consultant recommendations > hepatic dosing drugs  AKI: improved > Monitor BMET and UOP > Replace electrolytes as needed   PCCM available PRN Will transfer to med-surg I  updated his family bedside  Best practice:   Per TRH  Labs   CBC: Recent Labs  Lab 01/11/20 0535 01/16/20 1226 01/17/20 0253  WBC 17.0* 17.0* 10.7*  NEUTROABS 14.1* 15.0*  --   HGB 11.4* 11.7* 7.9*  HCT 33.3* 34.7* 23.9*  MCV 111.0* 111.6* 114.4*  PLT 40* 37* 21*    Basic Metabolic Panel: Recent Labs  Lab 01/11/20 0535 01/16/20 1226 01/17/20 0253  NA 133* 131* 130*  K 5.0 4.8 4.5  CL 96* 91* 96*  CO2 27 27 24   GLUCOSE 110* 97 119*  BUN 38* 45* 41*  CREATININE 1.06 1.37* 0.77  CALCIUM 8.6* 8.4* 7.8*  MG  --  1.7  --    GFR: Estimated Creatinine Clearance: 118.6 mL/min (by C-G formula based on SCr of 0.77 mg/dL). Recent Labs  Lab 01/11/20 0535 01/16/20 1226 01/16/20 1448 01/16/20 1810 01/16/20 1952 01/17/20 0253  WBC 17.0* 17.0*  --   --   --  10.7*  LATICACIDVEN  --  3.1* 3.4* 3.5* 3.3*  --     Liver Function Tests: Recent Labs  Lab 01/11/20 0535 01/16/20 1226 01/17/20 0253  AST 192* 209* 168*  ALT 174* 232* 173*  ALKPHOS 166* 228* 104  BILITOT 26.8* 36.0* 29.5*  PROT 6.5 6.5 5.9*  ALBUMIN 2.5* 2.3* 3.1*   No results for input(s): LIPASE, AMYLASE in the last 168 hours. No results for input(s): AMMONIA in the last 168 hours.  ABG    Component Value Date/Time   TCO2 26 09/29/2017 1048     Coagulation Profile: Recent Labs  Lab 01/11/20  [id number] 01/16/20 1226 01/17/20 0253  INR 2.5* 2.6* 3.5*    Cardiac Enzymes: No results for input(s): CKTOTAL, CKMB, CKMBINDEX, TROPONINI in the last 168 hours.  HbA1C: Hemoglobin A1C  Date/Time Value Ref Range Status  03/10/2017 04:17 PM 4.1  Final  06/06/2015 03:05 PM 5.0  Final    CBG: Recent Labs  Lab 01/16/20 1959 01/16/20 2344  GLUCAP 108* 121*     Critical care time: n/a     Roselie Awkward, MD [place] [facility] Pager: [phone] Cell: [phone] If no response, call [phone]

## 2020-01-17 NOTE — Progress Notes (Signed)
Pharmacy IV to PO conversion  The patient is receiving pantoprazole by the intravenous route.  Based on criteria approved by the Pharmacy and [place], the medication is being converted to the equivalent oral dose form.   No active GI bleeding or impaired absorption  Not s/p esophagectomy  Documented ability to take oral medications for > 24 hr  Plan to continue treatment for at least 1 day  If you have any questions about this conversion, please contact the Pharmacy Department (ext [phone]).  Thank you.  Reuel Boom, PharmD, BCPS [phone] 01/17/2020, 12:02 PM

## 2020-01-17 NOTE — Progress Notes (Signed)
PROGRESS NOTE    Jesse Esparza  NUU:[id number]  DOB: 21-Jan-1966  PCP: Charlott Rakes, MD Admit date:01/16/2020 54 y.o. male with  alcoholic cirrhosis, GERD, hyperlipidemia, hypertension, obesity who was recently hospitalized from 5/4/-01/11/2020 for acute alcoholic hepatitis, underwent paracentesis on 5/5 with drainage of 1.4 L of yellow ascitic fluid,  also had EGD on 5/14 subsequently discharged on twice daily Protonix; called GI office on 5/19 c/o crampy RLQ abdominal pain 4/10, feeling very weak, dizzy with some subjective fever and chills. He was advised to come to the emergency department.  ED Course: Afebrile, tachycardic, tachypneic and hypotensive with blood pressure of 89/61.  He was given a liter of IV fluid with not much improvement in his blood pressure.  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  Elevated lactic acid.  Elevated creatinine of 1.37.  Worsened hyperbilirubinemia as well as LFTs.  Abdominal x-ray was done which was unremarkable.  Hospital course: Initial call was made to PCCM to admit this patient to ICU however ED physician was told that this patient is appropriate for stepdown unit.  Hospital service were then called to admit the patient. Patient underwent CT abd showing benign pneumatosis in the ascending colon, with a few adjacent foci of extraluminal free air, suspected small amount of mesenteric venous gas , and free air in the absence of bowel perforation. Cholelithiasis,Cirrhosis with portal hypertension, splenomegaly, and small volume abdominopelvic ascites not enough to tap per IR (initially requested diagnostic tap to r/o peritonitis).   Subjective:  Patient known to me from last hospitalization.  He did have intermittent epistaxis at that time but denies any episodes currently.  Daughter and sister at bedside.  Patient and family understand  overall poor prognosis but have insisted on aggressive care so far.  Labs show worsening platelet count and anemia this morning.  Seen by GI earlier today.  Objective: Vitals:   01/17/20 1400 01/17/20 1500 01/17/20 1600 01/17/20 1700  BP: 104/65     Pulse: 91 94 89 86  Resp: (!) 23 (!) 23 (!) 22 20  Temp:   98.3 F (36.8 C)   TempSrc:   Oral   SpO2: 99% 100% 100% 100%  Weight:      Height:        Intake/Output Summary (Last 24 hours) at 01/17/2020 1712 Last data filed at 01/17/2020 1400 Gross per 24 hour  Intake 726.84 ml  Output 2050 ml  Net -1323.16 ml   Filed Weights   01/16/20 1223 01/17/20 0500  Weight: 108.9 kg 108.8 kg    Physical Examination:  General exam: Appears calm and comfortable  Respiratory system: Clear to auscultation. Respiratory effort normal. Cardiovascular system: S1 & S2 heard, RRR. No JVD, murmurs, rubs, gallops or clicks. No pedal edema. Gastrointestinal system: Abdomen is nondistended, soft and nontender. Normal bowel sounds heard. Central nervous system: Alert and oriented. No new focal neurological deficits. Extremities: No contractures, edema or joint deformities.  Skin: No rashes, lesions or ulcers Psychiatry: Judgement and insight appear normal. Mood & affect appropriate.   Data Reviewed: I have personally reviewed following labs and imaging studies  CBC: Recent Labs  Lab 01/11/20 0535 01/16/20 1226 01/17/20 0253 01/17/20 0908  WBC 17.0* 17.0* 10.7*  --   NEUTROABS 14.1* 15.0*  --   --   HGB 11.4* 11.7* 7.9* 7.9*  HCT 33.3* 34.7* 23.9* 23.4*  MCV 111.0* 111.6* 114.4*  --  PLT 40* 37* 21*  --    Basic Metabolic Panel: Recent Labs  Lab 01/11/20 0535 01/16/20 1226 01/17/20 0253  NA 133* 131* 130*  K 5.0 4.8 4.5  CL 96* 91* 96*  CO2 _0 GLUCOSE 110* 97 119*  BUN 38* 45* 41*  CREATININE 1.06 1.37* 0.77  CALCIUM 8.6* 8.4* 7.8*  MG  --  1.7  --    GFR: Estimated Creatinine Clearance: 118.6 mL/min (by C-G formula  based on SCr of 0.77 mg/dL). Liver Function Tests: Recent Labs  Lab 01/11/20 0535 01/16/20 1226 01/17/20 0253  AST 192* 209* 168*  ALT 174* 232* 173*  ALKPHOS 166* 228* 104  BILITOT 26.8* 36.0* 29.5*  PROT 6.5 6.5 5.9*  ALBUMIN 2.5* 2.3* 3.1*   No results for input(s): LIPASE, AMYLASE in the last 168 hours. No results for input(s): AMMONIA in the last 168 hours. Coagulation Profile: Recent Labs  Lab 01/11/20 0535 01/16/20 1226 01/17/20 0253  INR 2.5* 2.6* 3.5*   Cardiac Enzymes: No results for input(s): CKTOTAL, CKMB, CKMBINDEX, TROPONINI in the last 168 hours. BNP (last 3 results) No results for input(s): PROBNP in the last 8760 hours. HbA1C: No results for input(s): HGBA1C in the last 72 hours. CBG: Recent Labs  Lab 01/16/20 1959 01/16/20 2344  GLUCAP 108* 121*   Lipid Profile: No results for input(s): CHOL, HDL, LDLCALC, TRIG, CHOLHDL, LDLDIRECT in the last 72 hours. Thyroid Function Tests: Recent Labs    01/16/20 1226  TSH 1.907   Anemia Panel: No results for input(s): VITAMINB12, FOLATE, FERRITIN, TIBC, IRON, RETICCTPCT in the last 72 hours. Sepsis Labs: Recent Labs  Lab 01/16/20 1226 01/16/20 1448 01/16/20 1810 01/16/20 1952  LATICACIDVEN 3.1* 3.4* 3.5* 3.3*    Recent Results (from the past 240 hour(s))  Blood Culture (routine x 2)     Status: None (Preliminary result)   Collection Time: 01/16/20 12:40 PM   Specimen: BLOOD  Result Value Ref Range Status   Specimen Description   Final    BLOOD SITE NOT SPECIFIED Performed at [facility] [address], [place], [place] [address]    Special Requests   Final    BOTTLES DRAWN AEROBIC AND ANAEROBIC Blood Culture adequate volume Performed at [facility] [address], [place], [place] [address]    Culture  Setup Time   Final    IN BOTH AEROBIC AND ANAEROBIC BOTTLES GRAM NEGATIVE RODS CRITICAL RESULT CALLED TO, READ BACK BY AND VERIFIED WITH: J GRIMSLEY [facility]  01/17/20 0455 JDW    Culture   Final    NO GROWTH < 24 HOURS Performed at [facility], [place] [address], [place], [place] [address]    Report Status PENDING  Incomplete  Blood Culture ID Panel (Reflexed)     Status: Abnormal   Collection Time: 01/16/20 12:40 PM  Result Value Ref Range Status   Enterococcus species NOT DETECTED NOT DETECTED Final   Listeria monocytogenes NOT DETECTED NOT DETECTED Final   Staphylococcus species NOT DETECTED NOT DETECTED Final   Staphylococcus aureus (BCID) NOT DETECTED NOT DETECTED Final   Streptococcus species NOT DETECTED NOT DETECTED Final   Streptococcus agalactiae NOT DETECTED NOT DETECTED Final   Streptococcus pneumoniae NOT DETECTED NOT DETECTED Final   Streptococcus pyogenes NOT DETECTED NOT DETECTED Final   Acinetobacter baumannii NOT DETECTED NOT DETECTED Final   Enterobacteriaceae species DETECTED (A) NOT DETECTED Final    Comment: Enterobacteriaceae represent a large family of  gram-negative bacteria, not a single organism. CRITICAL RESULT CALLED TO, READ BACK BY AND VERIFIED WITH: J GRIMSLEY PHARMD 01/17/20 0455 JDW    Enterobacter cloacae complex NOT DETECTED NOT DETECTED Final   Escherichia coli DETECTED (A) NOT DETECTED Final    Comment: CRITICAL RESULT CALLED TO, READ BACK BY AND VERIFIED WITH: J GRIMSLEY PHARMD 01/17/20 0455 JDW`    Klebsiella oxytoca NOT DETECTED NOT DETECTED Final   Klebsiella pneumoniae NOT DETECTED NOT DETECTED Final   Proteus species NOT DETECTED NOT DETECTED Final   Serratia marcescens NOT DETECTED NOT DETECTED Final   Carbapenem resistance NOT DETECTED NOT DETECTED Final   Haemophilus influenzae NOT DETECTED NOT DETECTED Final   Neisseria meningitidis NOT DETECTED NOT DETECTED Final   Pseudomonas aeruginosa NOT DETECTED NOT DETECTED Final   Candida albicans NOT DETECTED NOT DETECTED Final   Candida glabrata NOT DETECTED NOT DETECTED Final   Candida krusei NOT DETECTED NOT DETECTED Final   Candida  parapsilosis NOT DETECTED NOT DETECTED Final   Candida tropicalis NOT DETECTED NOT DETECTED Final    Comment: Performed at [facility], [place] [address], [place], [place] [address]  Urine culture     Status: Abnormal (Preliminary result)   Collection Time: 01/16/20 12:44 PM   Specimen: In/Out Cath Urine  Result Value Ref Range Status   Specimen Description   Final    IN/OUT CATH URINE Performed at [facility] [address] [place], [place] [address]    Special Requests   Final    NONE Performed at [facility], [place] [address] [place], [place] [address]    Culture >=100,000 COLONIES/mL ESCHERICHIA COLI (A)  Final   Report Status PENDING  Incomplete  SARS Coronavirus 2 by RT PCR (hospital order, performed in [facility] hospital lab) Nasopharyngeal Nasopharyngeal Swab     Status: None   Collection Time: 01/16/20 12:57 PM   Specimen: Nasopharyngeal Swab  Result Value Ref Range Status   SARS Coronavirus 2 NEGATIVE NEGATIVE Final    Comment: (NOTE) SARS-CoV-2 target nucleic acids are NOT DETECTED. The SARS-CoV-2 RNA is generally detectable in upper and lower respiratory specimens during the acute phase of infection. The lowest concentration of SARS-CoV-2 viral copies this assay can detect is 250 copies / mL. A negative result does not preclude SARS-CoV-2 infection and should not be used as the sole basis for treatment or other patient management decisions.  A negative result may occur with improper specimen collection / handling, submission of specimen other than nasopharyngeal swab, presence of viral mutation(s) within the areas targeted by this assay, and inadequate number of viral copies (<250 copies / mL). A negative result must be combined with clinical observations, patient history, and epidemiological information. Fact Sheet for Patients:   StrictlyIdeas.no Fact Sheet for Healthcare  Providers: BankingDealers.co.za This test is not yet approved or cleared  by the [place] FDA and has been authorized for detection and/or diagnosis of SARS-CoV-2 by FDA under an Emergency Use Authorization (EUA).  This EUA will remain in effect (meaning this test can be used) for the duration of the COVID-19 declaration under Section 564(b)(1) of the Act, 21 U.S.C. section 360bbb-3(b)(1), unless the authorization is terminated or revoked sooner. Performed at [facility], [place] [address], [place], [place] [address]   MRSA PCR Screening     Status: None   Collection Time: 01/16/20  7:04 PM   Specimen: Nasal Mucosa; Nasopharyngeal  Result Value Ref Range Status  MRSA by PCR NEGATIVE NEGATIVE Final    Comment:        The GeneXpert MRSA Assay (FDA approved for NASAL specimens only), is one component of a comprehensive MRSA colonization surveillance program. It is not intended to diagnose MRSA infection nor to guide or monitor treatment for MRSA infections. Performed at [facility], [place] [address] [place], [place] [address]   Blood Culture (routine x 2)     Status: None (Preliminary result)   Collection Time: 01/16/20  7:51 PM   Specimen: BLOOD  Result Value Ref Range Status   Specimen Description   Final    BLOOD BLOOD RIGHT ARM Performed at [facility] [address] [place], [place] [address]    Special Requests   Final    BOTTLES DRAWN AEROBIC AND ANAEROBIC Blood Culture adequate volume Performed at [facility] [address] [place], [place] [address]    Culture   Final    NO GROWTH < 12 HOURS Performed at [facility] [address], [place], [place] [address]    Report Status PENDING  Incomplete      Radiology Studies: CT ABDOMEN PELVIS WO CONTRAST  Result Date: 01/16/2020 CLINICAL DATA:  Abdominal pain and distension. History of cirrhosis. EXAM:  CT ABDOMEN AND PELVIS WITHOUT CONTRAST TECHNIQUE: Multidetector CT imaging of the abdomen and pelvis was performed following the standard protocol without IV contrast. COMPARISON:  Most recent CT fifteen days ago 01/01/2020. FINDINGS: Lower chest: Trace right pleural effusion/thickening. Rounded density in the lower most right lung base is new from prior exam and likely atelectasis, series 6, image 15. Hepatobiliary: Cirrhotic hepatic morphology with nodular contours. No obvious focal lesion on noncontrast exam. Small gallstones within the gallbladder. No biliary dilatation. Pancreas: No ductal dilatation or inflammation. Spleen: Splenomegaly with spleen measuring 14 x 6.8 x 14.6 cm (volume = 730 cm^3). Findings are grossly stable allowing for differences in caliper placement. Adrenals/Urinary Tract: Minimal thickening of the right adrenal gland without dominant nodule. No hydronephrosis. No renal or ureteral calculi. Urinary bladder is physiologically distended. Stomach/Bowel: Stomach is unremarkable. Administered enteric contrast reaches the mid distal small bowel. Mild wall thickening of the duodenum and proximal small bowel loops. No obstruction. Portions of normal appendix are visualized, partially obscured by adjacent ascites. There is mottled air in the ascending colon suspicious for pneumatosis, with a few adjacent foci of extraluminal air, for example series 2, image 48 and 47. Small focus of air centrally may be within the mesenteric veins versus mesenteric fat, series 2, image 46. there is no definite associated colonic wall thickening or pericolonic edema. Formed stool throughout the more distal colon with scattered diverticula. No focal diverticulitis. Vascular/Lymphatic: Aortic atherosclerosis. Perisplenic collaterals with splenorenal shunting were better appreciated on prior contrast-enhanced exam. Tiny focus of air in the right mesentery which may be intravascular versus in the mesenteric fat, series  2, image 46. possible additional tiny mesenteric venous focus in the central abdomen, series 2, image 37. Prominent periportal lymph nodes which are likely reactive. Reproductive: Stable prostate gland. Other: Small volume abdominopelvic ascites, greatest volume in the right upper and lower quadrants. Tiny foci of free air adjacent to the ascending colon in the right abdomen. No evidence of intra-abdominal abscess. There is a small fat containing umbilical hernia. Musculoskeletal: Degenerative disc disease and facet hypertrophy of the lumbar spine. There are no acute or suspicious osseous abnormalities. IMPRESSION: 1. Findings suspicious for pneumatosis in the ascending  colon, with a few adjacent foci of extraluminal free air. Small amount of mesenteric venous gas is suspected in the region of pneumatosis. However, there is no significant colonic wall thickening or inflammatory change in the region of pneumatosis and extraluminal air. This may be benign pneumatosis, and free air in the absence of bowel perforation has been described, however this is indeterminate on imaging. Close clinical follow-up is recommended. 2. Mild wall thickening of the duodenum and proximal small bowel loops, likely reactive. 3. Cirrhosis with portal hypertension, splenomegaly, and small volume abdominopelvic ascites. 4. Cholelithiasis. Aortic Atherosclerosis (ICD10-I70.0). These results were called by telephone at the time of interpretation on 01/16/2020 at 5:34 pm to Dr Darliss Cheney , who verbally acknowledged these results. Electronically Signed   By: Keith Rake M.D.   On: 01/16/2020 17:37   US Abdomen Limited  Result Date: 01/16/2020 CLINICAL DATA:  History of cirrhosis. Please perform ascites search ultrasound and ultrasound-guided paracentesis as indicated. EXAM: LIMITED ABDOMEN ULTRASOUND FOR ASCITES TECHNIQUE: Limited ultrasound survey for ascites was performed in all four abdominal quadrants. COMPARISON:   Ultrasound-guided paracentesis-01/08/2020 (yielding 1.2 L peritoneal fluid) FINDINGS: Sonographic evaluation of the abdomen demonstrates a trace amount intra-abdominal ascites within the bilateral lower abdominal quadrants, too small to allow for safe ultrasound-guided paracentesis. IMPRESSION: Trace amount of intra-abdominal ascites, too small to allow for safe ultrasound-guided paracentesis. No paracentesis attempted. Electronically Signed   By: Sandi Mariscal M.D.   On: 01/16/2020 16:55   DG Chest Port 1 View  Result Date: 01/16/2020 CLINICAL DATA:  Weak. Additional provided: Patient reports weakness, abdominal distension that is more than usual, intermittent dizziness when standing, history of liver failure. EXAM: PORTABLE CHEST 1 VIEW COMPARISON:  Prior chest radiograph 01/08/2020 FINDINGS: Borderline cardiomegaly, unchanged. No appreciable airspace consolidation within the lungs. Redemonstrated mild scarring and/or atelectasis within the left lung base. No evidence of pleural effusion or pneumothorax. No acute bony abnormality identified. IMPRESSION: No evidence of acute cardiopulmonary abnormality. Borderline cardiomegaly, unchanged. Redemonstrated mild scarring and/or atelectasis within the left lung base. Electronically Signed   By: Kellie Simmering DO   On: 01/16/2020 13:27        Scheduled Meds: . Chlorhexidine Gluconate Cloth  6 each Topical Q0600  . docusate sodium  100 mg Oral BID  . feeding supplement (PRO-STAT SUGAR FREE 64)  30 mL Oral TID BM  . lactulose  20 g Oral TID  . mouth rinse  15 mL Mouth Rinse BID  . multivitamin with minerals  1 tablet Oral Daily  . pantoprazole  40 mg Oral BID  . [START ON 01/19/2020] prednisoLONE  10 mg Oral Daily  . prednisoLONE  20 mg Oral Daily  . rifaximin  550 mg Oral BID  . sodium chloride flush  3 mL Intravenous Q12H   Continuous Infusions: . cefTRIAXone (ROCEPHIN)  IV Stopped (01/16/20 2335)  . sodium chloride       Assessment/Plan: Severe  sepsis, abdominal pain : Patient met sepsis criteria based on leukocytosis, tachycardia, tachypnea and lactic acidosis. Likely abdominal source of infection. CT abdomen and pelvis WO contrast as above. Started on empiric abx (ceftriaxone) for subacute bacterial peritonitis.  IR consulted for diagnostic paracentesis but deferred after CT findings.  Follow blood culture.  His blood pressure appears improved with fluid resuscitation and albumin infusions.  F/U GI recommendations and GS eval if needed. Poor candidate for surgical interventions with below problems. Taper IV fluids to off to keep systolic blood pressure greater than 90 in order  to avoid further hemodilution or third spacing..  Can continue IV albumin for hypotension.  Chronic alcoholic hepatitis with cirrhosis/thrombocytopenia/hepatic encephalopathy:  Recent admission for alcoholic hepatitis with significant hyperbilirubinemia worsened on presentation (26->36), thrombocytopenia (Plt stable at 37 -40 on presentation) -resumed home dose of prednisone, lactulose, rifaximin.He was advised to come the emergency department by [facility] - requested inpatient consultation.  Labs this morning show improvement in T bili to 29 but worsening blood counts as below  Acute kidney injury: creat at last d/c was 1.06. On arrival this time BUN/cr at 45/1.37.  Secondary to sepsis vs volume depletion-improved to 41/0.7 with  IV fluids.   Acute on Chronic microcytic anemia due to chronic GI blood loss:  Hemoglobin at baseline on presentation but dropped to 7.9 this morning. S/p EGD in recent hospitalization. On protonix IV BID.  Denies ongoing melena/hematochezia . ?Dilutional. Monitor daily hgb, transfuse <7 and await GI eval/opinion.   Chronic thrombocytopenia: Likely secondary to alcohol use/splenomegaly.  Plt stable at 37 -40 on presentation. No active bleeding. This morning CBC shows worsening platelets to 21.. Watch for recurrent signs of bleeding/severe  epistaxis-May need transfusion.  Will type and screen.  Taper IV fluids to off to keep systolic blood pressure greater than 90 in order to avoid further hemodilution.  Leucocytosis: secondary to sepsis vs chronic prednisone use. Monitor    DVT prophylaxis: SCD Code Status: Full Family / Patient Communication: Discussed with patient and family at bedside Disposition Plan: Will transfer out of stepdown unit to telemetry floor if blood pressure remains stable Status is: Inpatient  Remains inpatient appropriate because:Inpatient level of care appropriate due to severity of illness   Dispo: The patient is from: Home              Anticipated d/c is to: Home              Anticipated d/c date is: > 3 days              Patient currently is not medically stable to d/c.        LOS: 1 day    Time spent: 35 minutes    Guilford Shi, MD [facility] Pager in [place]  If 7PM-7AM, please contact night-coverage [email or web address] 01/17/2020, 5:12 PM

## 2020-01-17 NOTE — Progress Notes (Signed)
CRITICAL VALUE ALERT  Critical Value:  Plt: 21, Bilirubin 29.5  Date & Time Notied:  01/16/20 @ 0300  Provider Notified: Sharlet Salina. M, NP  Orders Received/Actions taken: NP notified twice, no new orders at this time, will continue to monitor.

## 2020-01-18 DIAGNOSIS — R7881 Bacteremia: Secondary | ICD-10-CM | POA: Diagnosis present

## 2020-01-18 DIAGNOSIS — A419 Sepsis, unspecified organism: Secondary | ICD-10-CM

## 2020-01-18 DIAGNOSIS — K6389 Other specified diseases of intestine: Secondary | ICD-10-CM | POA: Diagnosis present

## 2020-01-18 DIAGNOSIS — R6520 Severe sepsis without septic shock: Secondary | ICD-10-CM

## 2020-01-18 LAB — COMPREHENSIVE METABOLIC PANEL
ALT: 129 U/L — ABNORMAL HIGH (ref 0–44)
ALT: 119 U/L — ABNORMAL HIGH (ref 0–44)
AST: 111 U/L — ABNORMAL HIGH (ref 15–41)
AST: 103 U/L — ABNORMAL HIGH (ref 15–41)
Albumin: 3.8 g/dL (ref 3.5–5.0)
Albumin: 4 g/dL (ref 3.5–5.0)
Alkaline Phosphatase: 116 U/L (ref 38–126)
Alkaline Phosphatase: 108 U/L (ref 38–126)
Anion gap: 8 (ref 5–15)
Anion gap: 8 (ref 5–15)
BUN: 36 mg/dL — ABNORMAL HIGH (ref 6–20)
BUN: 33 mg/dL — ABNORMAL HIGH (ref 6–20)
CO2: 25 mmol/L (ref 22–32)
CO2: 25 mmol/L (ref 22–32)
Calcium: 8.2 mg/dL — ABNORMAL LOW (ref 8.9–10.3)
Calcium: 8.6 mg/dL — ABNORMAL LOW (ref 8.9–10.3)
Chloride: 101 mmol/L (ref 98–111)
Chloride: 102 mmol/L (ref 98–111)
Creatinine, Ser: 0.71 mg/dL (ref 0.61–1.24)
Creatinine, Ser: 0.6 mg/dL — ABNORMAL LOW (ref 0.61–1.24)
GFR calc Af Amer: >60 mL/min (ref >60)
GFR calc Af Amer: >60 mL/min (ref >60)
GFR calc non Af Amer: >60 mL/min (ref >60)
GFR calc non Af Amer: >60 mL/min (ref >60)
Glucose, Bld: 120 mg/dL — ABNORMAL HIGH (ref 70–99)
Glucose, Bld: 102 mg/dL — ABNORMAL HIGH (ref 70–99)
Potassium: 4 mmol/L (ref 3.5–5.1)
Potassium: 4.2 mmol/L (ref 3.5–5.1)
Sodium: 134 mmol/L — ABNORMAL LOW (ref 135–145)
Sodium: 135 mmol/L (ref 135–145)
Total Bilirubin: 25 mg/dL (ref 0.3–1.2)
Total Bilirubin: 24.6 mg/dL (ref 0.3–1.2)
Total Protein: 6 g/dL — ABNORMAL LOW (ref 6.5–8.1)
Total Protein: 6.2 g/dL — ABNORMAL LOW (ref 6.5–8.1)

## 2020-01-18 LAB — FIBRINOGEN: Fibrinogen: 113 mg/dL — ABNORMAL LOW (ref 210–475)

## 2020-01-18 LAB — DIC (DISSEMINATED INTRAVASCULAR COAGULATION)PANEL
D-Dimer, Quant: 8.28 ug/mL-FEU — ABNORMAL HIGH (ref 0.00–0.50)
Fibrinogen: 112 mg/dL — ABNORMAL LOW (ref 210–475)
INR: 3.6 — ABNORMAL HIGH (ref 0.8–1.2)
Platelets: 16 10*3/uL — CL (ref 150–400)
Prothrombin Time: 34.7 seconds — ABNORMAL HIGH (ref 11.4–15.2)
Smear Review: NONE SEEN
aPTT: 54 seconds — ABNORMAL HIGH (ref 24–36)

## 2020-01-18 LAB — PROTIME-INR
INR: 3.7 — ABNORMAL HIGH (ref 0.8–1.2)
Prothrombin Time: 35.3 seconds — ABNORMAL HIGH (ref 11.4–15.2)

## 2020-01-18 LAB — CBC
HCT: 23.6 % — ABNORMAL LOW (ref 39.0–52.0)
Hemoglobin: 7.8 g/dL — ABNORMAL LOW (ref 13.0–17.0)
MCH: 38 pg — ABNORMAL HIGH (ref 26.0–34.0)
MCHC: 33.1 g/dL (ref 30.0–36.0)
MCV: 115.1 fL — ABNORMAL HIGH (ref 80.0–100.0)
Platelets: 16 10*3/uL — CL (ref 150–400)
RBC: 2.05 MIL/uL — ABNORMAL LOW (ref 4.22–5.81)
RDW: 17.2 % — ABNORMAL HIGH (ref 11.5–15.5)
WBC: 7.7 10*3/uL (ref 4.0–10.5)
nRBC: 0 % (ref 0.0–0.2)

## 2020-01-18 LAB — D-DIMER, QUANTITATIVE: D-Dimer, Quant: 7.79 ug/mL-FEU — ABNORMAL HIGH (ref 0.00–0.50)

## 2020-01-18 MED ORDER — PHYTONADIONE 5 MG PO TABS
10.0000 mg | ORAL_TABLET | Freq: Once | ORAL | Status: AC
  Administered 2020-01-18: 10 mg via ORAL
  Filled 2020-01-18: qty 2

## 2020-01-18 MED ORDER — TRAMADOL HCL 50 MG PO TABS
50.0000 mg | ORAL_TABLET | Freq: Four times a day (QID) | ORAL | Status: DC | PRN
  Administered 2020-01-18 – 2020-01-21 (×3): 50 mg via ORAL
  Filled 2020-01-18 (×3): qty 1

## 2020-01-18 MED ORDER — SODIUM CHLORIDE 0.9% IV SOLUTION
Freq: Once | INTRAVENOUS | Status: AC
  Administered 2020-01-18: 10 mL/h via INTRAVENOUS

## 2020-01-18 NOTE — Progress Notes (Signed)
Chaplain was contacted by nurse.  Patient was planning to be moved to DNR status.  Chaplain met with patient to discuss his feelings about that. Patient shared that he had given up whisky four years ago but continued to drink beer.  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 Patient is divorced and lives with his parents. He had a long discussion with family about his prognosis and has an attitude that could be summed up with the phrase "it is what is it is."  He is a man of faith and hopes to continue to love his 4-5 grandchildren until it is his time. His sister and daughter are here today at hospital.  One is a Psychologist, sport and exercise and understands and supports his decision to DNR.  He says "so many people are praying for me" but then asked for the chaplain to pray too. Chaplain offered prayer and words of scripture.  Patient thanked the chaplain for coming. [facility]. [facility]

## 2020-01-18 NOTE — Progress Notes (Addendum)
PROGRESS NOTE    Jesse Esparza  QJF:[id number]  DOB: Mar 05, 1966  PCP: Charlott Rakes, MD Admit date:01/16/2020 54 y.o. male with  alcoholic cirrhosis, GERD, hyperlipidemia, hypertension, obesity who was recently hospitalized from 5/4/-01/11/2020 for acute alcoholic hepatitis, underwent paracentesis on 5/5 with drainage of 1.4 L of yellow ascitic fluid,  also had EGD on 5/14 subsequently discharged on twice daily Protonix; called GI office on 5/19 c/o crampy RLQ abdominal pain 4/10, feeling very weak, dizzy with some subjective fever and chills. He was advised to come to the emergency department.  ED Course: Afebrile, tachycardic, tachypneic and hypotensive with blood pressure of 89/61.  He was given a liter of IV fluid with not much improvement in his blood pressure.  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  Elevated lactic acid.  Elevated creatinine of 1.37.  Worsened hyperbilirubinemia as well as LFTs.  Abdominal x-ray was done which was unremarkable.  Hospital course: Patient underwent CT abd showing benign pneumatosis in the ascending colon, with a few adjacent foci of extraluminal free air, suspected small amount of mesenteric venous gas , and free air in the absence of bowel perforation. Cholelithiasis,Cirrhosis with portal hypertension, splenomegaly, and small volume abdominopelvic ascites not enough to tap per IR (initially requested diagnostic tap to r/o peritonitis).  Patient known to me from last hospitalization.  He did have intermittent epistaxis at that time but denies any episodes currently.Patient and family understand overall poor prognosis but have insisted on aggressive care so far.  Subjective:  Patient now out of ICU. Maintaining SBP >100, out of bed to chair. Saturating well on RA.  Labs show worsening platelet count and anemia. Seen by GI earlier today  and guarded prognosis reiterated. Daughter and sister at bedside-concerned about his health.  Objective: Vitals:   01/18/20 0129 01/18/20 0436 01/18/20 0532 01/18/20 1341  BP: 118/70  112/69 112/64  Pulse: 94  87 85  Resp: '18  18 20  '$ Temp: 98.5 F (36.9 C)  98.2 F (36.8 C) 99.2 F (37.3 C)  TempSrc: Oral  Oral Oral  SpO2: 98%  100% 98%  Weight:  114.8 kg    Height:        Intake/Output Summary (Last 24 hours) at 01/18/2020 1534 Last data filed at 01/18/2020 8937 Gross per 24 hour  Intake 123 ml  Output 1650 ml  Net -1527 ml   Filed Weights   01/16/20 1223 01/17/20 0500 01/18/20 0436  Weight: 108.9 kg 108.8 kg 114.8 kg    Physical Examination:  General exam: Appears jaundiced, no acute distress  Respiratory system: Clear to auscultation. Decreased breath sounds at bases. Respiratory effort normal. Cardiovascular system: S1 & S2 heard, RRR. No JVD, murmurs. Trace leg edema. Gastrointestinal system: Abdomen is obese,  nondistended, soft and nontender. Normal BS Central nervous system: Alert and oriented x3 . No new focal neurological deficits. Extremities: No contractures, trace edema.  Skin: No rashes, lesions or ulcers Psychiatry: Judgement and insight appear normal. Mood & affect -appropriately concerned.   Data Reviewed: I have personally reviewed following labs and imaging studies  CBC: Recent Labs  Lab 01/16/20 1226 01/17/20 0253 01/17/20 0908 01/17/20 1646 01/18/20 0041  WBC 17.0* 10.7*  --   --  7.7  NEUTROABS 15.0*  --   --   --   --   HGB 11.7* 7.9* 7.9* 7.8* 7.8*  HCT 34.7* 23.9* 23.4* 23.6* 23.6*  MCV  111.6* 114.4*  --   --  115.1*  PLT 37* 21*  --   --  16*   Basic Metabolic Panel: Recent Labs  Lab 01/16/20 1226 01/17/20 0253 01/18/20 0041 01/18/20 0505  NA 131* 130* 134* 135  K 4.8 4.5 4.0 4.2  CL 91* 96* 101 102  CO2 '27 24 25 25  '$ GLUCOSE 97 119* 120* 102*  BUN 45* 41* 36* 33*  CREATININE 1.37* 0.77 0.71 0.60*  CALCIUM 8.4* 7.8* 8.2*  8.6*  MG 1.7  --   --   --    GFR: Estimated Creatinine Clearance: 122.2 mL/min (A) (by C-G formula based on SCr of 0.6 mg/dL (L)). Liver Function Tests: Recent Labs  Lab 01/16/20 1226 01/17/20 0253 01/18/20 0041 01/18/20 0505  AST 209* 168* 111* 103*  ALT 232* 173* 129* 119*  ALKPHOS 228* 104 116 108  BILITOT 36.0* 29.5* 25.0* 24.6*  PROT 6.5 5.9* 6.0* 6.2*  ALBUMIN 2.3* 3.1* 3.8 4.0   No results for input(s): LIPASE, AMYLASE in the last 168 hours. No results for input(s): AMMONIA in the last 168 hours. Coagulation Profile: Recent Labs  Lab 01/16/20 1226 01/17/20 0253 01/18/20 0840  INR 2.6* 3.5* 3.7*   Cardiac Enzymes: No results for input(s): CKTOTAL, CKMB, CKMBINDEX, TROPONINI in the last 168 hours. BNP (last 3 results) No results for input(s): PROBNP in the last 8760 hours. HbA1C: No results for input(s): HGBA1C in the last 72 hours. CBG: Recent Labs  Lab 01/16/20 1959 01/16/20 2344  GLUCAP 108* 121*   Lipid Profile: No results for input(s): CHOL, HDL, LDLCALC, TRIG, CHOLHDL, LDLDIRECT in the last 72 hours. Thyroid Function Tests: Recent Labs    01/16/20 1226  TSH 1.907   Anemia Panel: No results for input(s): VITAMINB12, FOLATE, FERRITIN, TIBC, IRON, RETICCTPCT in the last 72 hours. Sepsis Labs: Recent Labs  Lab 01/16/20 1226 01/16/20 1448 01/16/20 1810 01/16/20 1952  LATICACIDVEN 3.1* 3.4* 3.5* 3.3*    Recent Results (from the past 240 hour(s))  Blood Culture (routine x 2)     Status: Abnormal (Preliminary result)   Collection Time: 01/16/20 12:40 PM   Specimen: BLOOD  Result Value Ref Range Status   Specimen Description   Final    BLOOD SITE NOT SPECIFIED Performed at [facility] [address] [place], [place] [address]    Special Requests   Final    BOTTLES DRAWN AEROBIC AND ANAEROBIC Blood Culture adequate volume Performed at [facility] [address] [place], [place] [address]    Culture  Setup Time    Final    IN BOTH AEROBIC AND ANAEROBIC BOTTLES GRAM NEGATIVE RODS CRITICAL RESULT CALLED TO, READ BACK BY AND VERIFIED WITH: J GRIMSLEY PHARMD 01/17/20 0455 JDW    Culture (A)  Final    ESCHERICHIA COLI SUSCEPTIBILITIES TO FOLLOW Performed at [facility], [place] [address], [place], [place] [address]    Report Status PENDING  Incomplete  Blood Culture ID Panel (Reflexed)     Status: Abnormal   Collection Time: 01/16/20 12:40 PM  Result Value Ref Range Status   Enterococcus species NOT DETECTED NOT DETECTED Final   Listeria monocytogenes NOT DETECTED NOT DETECTED Final   Staphylococcus species NOT DETECTED NOT DETECTED Final   Staphylococcus aureus (BCID) NOT DETECTED NOT DETECTED Final   Streptococcus species NOT DETECTED NOT DETECTED Final   Streptococcus agalactiae NOT DETECTED NOT DETECTED Final   Streptococcus pneumoniae NOT DETECTED NOT DETECTED Final  Streptococcus pyogenes NOT DETECTED NOT DETECTED Final   Acinetobacter baumannii NOT DETECTED NOT DETECTED Final   Enterobacteriaceae species DETECTED (A) NOT DETECTED Final    Comment: Enterobacteriaceae represent a large family of gram-negative bacteria, not a single organism. CRITICAL RESULT CALLED TO, READ BACK BY AND VERIFIED WITH: J GRIMSLEY PHARMD 01/17/20 0455 JDW    Enterobacter cloacae complex NOT DETECTED NOT DETECTED Final   Escherichia coli DETECTED (A) NOT DETECTED Final    Comment: CRITICAL RESULT CALLED TO, READ BACK BY AND VERIFIED WITH: J GRIMSLEY PHARMD 01/17/20 0455 JDW`    Klebsiella oxytoca NOT DETECTED NOT DETECTED Final   Klebsiella pneumoniae NOT DETECTED NOT DETECTED Final   Proteus species NOT DETECTED NOT DETECTED Final   Serratia marcescens NOT DETECTED NOT DETECTED Final   Carbapenem resistance NOT DETECTED NOT DETECTED Final   Haemophilus influenzae NOT DETECTED NOT DETECTED Final   Neisseria meningitidis NOT DETECTED NOT DETECTED Final   Pseudomonas aeruginosa NOT DETECTED NOT DETECTED  Final   Candida albicans NOT DETECTED NOT DETECTED Final   Candida glabrata NOT DETECTED NOT DETECTED Final   Candida krusei NOT DETECTED NOT DETECTED Final   Candida parapsilosis NOT DETECTED NOT DETECTED Final   Candida tropicalis NOT DETECTED NOT DETECTED Final    Comment: Performed at [facility], [place] [address], [place], [place] [address]  Urine culture     Status: Abnormal (Preliminary result)   Collection Time: 01/16/20 12:44 PM   Specimen: In/Out Cath Urine  Result Value Ref Range Status   Specimen Description   Final    IN/OUT CATH URINE Performed at [facility] [address] [place], [place] [address]    Special Requests   Final    NONE Performed at [facility], [place] [address], [place], [place] [address]    Culture (A)  Final    >=100,000 COLONIES/mL ESCHERICHIA COLI SUSCEPTIBILITIES TO FOLLOW Performed at [facility], [place] [address] [place], [place] [address]    Report Status PENDING  Incomplete  SARS Coronavirus 2 by RT PCR (hospital order, performed in [facility] hospital lab) Nasopharyngeal Nasopharyngeal Swab     Status: None   Collection Time: 01/16/20 12:57 PM   Specimen: Nasopharyngeal Swab  Result Value Ref Range Status   SARS Coronavirus 2 NEGATIVE NEGATIVE Final    Comment: (NOTE) SARS-CoV-2 target nucleic acids are NOT DETECTED. The SARS-CoV-2 RNA is generally detectable in upper and lower respiratory specimens during the acute phase of infection. The lowest concentration of SARS-CoV-2 viral copies this assay can detect is 250 copies / mL. A negative result does not preclude SARS-CoV-2 infection and should not be used as the sole basis for treatment or other patient management decisions.  A negative result may occur with improper specimen collection / handling, submission of specimen other than nasopharyngeal swab, presence of viral mutation(s) within the areas targeted by this assay, and  inadequate number of viral copies (<250 copies / mL). A negative result must be combined with clinical observations, patient history, and epidemiological information. Fact Sheet for Patients:   StrictlyIdeas.no Fact Sheet for Healthcare Providers: BankingDealers.co.za This test is not yet approved or cleared  by the [place] FDA and has been authorized for detection and/or diagnosis of SARS-CoV-2 by FDA under an Emergency Use Authorization (EUA).  This EUA will remain in effect (meaning this test can be used) for the duration of the COVID-19 declaration under Section 564(b)(1) of the Act, 21 U.S.C.  section 360bbb-3(b)(1), unless the authorization is terminated or revoked sooner. Performed at [facility], [place] [address], [place], [place] [address]   MRSA PCR Screening     Status: None   Collection Time: 01/16/20  7:04 PM   Specimen: Nasal Mucosa; Nasopharyngeal  Result Value Ref Range Status   MRSA by PCR NEGATIVE NEGATIVE Final    Comment:        The GeneXpert MRSA Assay (FDA approved for NASAL specimens only), is one component of a comprehensive MRSA colonization surveillance program. It is not intended to diagnose MRSA infection nor to guide or monitor treatment for MRSA infections. Performed at [facility], [place] [address] [place], [place] [address]   Blood Culture (routine x 2)     Status: None (Preliminary result)   Collection Time: 01/16/20  7:51 PM   Specimen: BLOOD  Result Value Ref Range Status   Specimen Description   Final    BLOOD BLOOD RIGHT ARM Performed at [facility] [address] [place], [place] [address]    Special Requests   Final    BOTTLES DRAWN AEROBIC AND ANAEROBIC Blood Culture adequate volume Performed at [facility] [address] [place], [place] [address]    Culture   Final    NO GROWTH 2 DAYS Performed at  [facility] [address] [place], [place] [address]    Report Status PENDING  Incomplete      Radiology Studies: CT ABDOMEN PELVIS WO CONTRAST  Result Date: 01/16/2020 CLINICAL DATA:  Abdominal pain and distension. History of cirrhosis. EXAM: CT ABDOMEN AND PELVIS WITHOUT CONTRAST TECHNIQUE: Multidetector CT imaging of the abdomen and pelvis was performed following the standard protocol without IV contrast. COMPARISON:  Most recent CT fifteen days ago 01/01/2020. FINDINGS: Lower chest: Trace right pleural effusion/thickening. Rounded density in the lower most right lung base is new from prior exam and likely atelectasis, series 6, image 15. Hepatobiliary: Cirrhotic hepatic morphology with nodular contours. No obvious focal lesion on noncontrast exam. Small gallstones within the gallbladder. No biliary dilatation. Pancreas: No ductal dilatation or inflammation. Spleen: Splenomegaly with spleen measuring 14 x 6.8 x 14.6 cm (volume = 730 cm^3). Findings are grossly stable allowing for differences in caliper placement. Adrenals/Urinary Tract: Minimal thickening of the right adrenal gland without dominant nodule. No hydronephrosis. No renal or ureteral calculi. Urinary bladder is physiologically distended. Stomach/Bowel: Stomach is unremarkable. Administered enteric contrast reaches the mid distal small bowel. Mild wall thickening of the duodenum and proximal small bowel loops. No obstruction. Portions of normal appendix are visualized, partially obscured by adjacent ascites. There is mottled air in the ascending colon suspicious for pneumatosis, with a few adjacent foci of extraluminal air, for example series 2, image 48 and 47. Small focus of air centrally may be within the mesenteric veins versus mesenteric fat, series 2, image 46. there is no definite associated colonic wall thickening or pericolonic edema. Formed stool throughout the more distal colon with scattered diverticula. No focal  diverticulitis. Vascular/Lymphatic: Aortic atherosclerosis. Perisplenic collaterals with splenorenal shunting were better appreciated on prior contrast-enhanced exam. Tiny focus of air in the right mesentery which may be intravascular versus in the mesenteric fat, series 2, image 46. possible additional tiny mesenteric venous focus in the central abdomen, series 2, image 37. Prominent periportal lymph nodes which are likely reactive. Reproductive: Stable prostate gland. Other: Small volume abdominopelvic ascites, greatest volume in the right upper and lower quadrants.  Tiny foci of free air adjacent to the ascending colon in the right abdomen. No evidence of intra-abdominal abscess. There is a small fat containing umbilical hernia. Musculoskeletal: Degenerative disc disease and facet hypertrophy of the lumbar spine. There are no acute or suspicious osseous abnormalities. IMPRESSION: 1. Findings suspicious for pneumatosis in the ascending colon, with a few adjacent foci of extraluminal free air. Small amount of mesenteric venous gas is suspected in the region of pneumatosis. However, there is no significant colonic wall thickening or inflammatory change in the region of pneumatosis and extraluminal air. This may be benign pneumatosis, and free air in the absence of bowel perforation has been described, however this is indeterminate on imaging. Close clinical follow-up is recommended. 2. Mild wall thickening of the duodenum and proximal small bowel loops, likely reactive. 3. Cirrhosis with portal hypertension, splenomegaly, and small volume abdominopelvic ascites. 4. Cholelithiasis. Aortic Atherosclerosis (ICD10-I70.0). These results were called by telephone at the time of interpretation on 01/16/2020 at 5:34 pm to Dr Darliss Cheney , who verbally acknowledged these results. Electronically Signed   By: Keith Rake M.D.   On: 01/16/2020 17:37   US Abdomen Limited  Result Date: 01/16/2020 CLINICAL DATA:  History of  cirrhosis. Please perform ascites search ultrasound and ultrasound-guided paracentesis as indicated. EXAM: LIMITED ABDOMEN ULTRASOUND FOR ASCITES TECHNIQUE: Limited ultrasound survey for ascites was performed in all four abdominal quadrants. COMPARISON:  Ultrasound-guided paracentesis-01/08/2020 (yielding 1.2 L peritoneal fluid) FINDINGS: Sonographic evaluation of the abdomen demonstrates a trace amount intra-abdominal ascites within the bilateral lower abdominal quadrants, too small to allow for safe ultrasound-guided paracentesis. IMPRESSION: Trace amount of intra-abdominal ascites, too small to allow for safe ultrasound-guided paracentesis. No paracentesis attempted. Electronically Signed   By: Sandi Mariscal M.D.   On: 01/16/2020 16:55        Scheduled Meds: . Chlorhexidine Gluconate Cloth  6 each Topical Q0600  . docusate sodium  100 mg Oral BID  . feeding supplement (PRO-STAT SUGAR FREE 64)  30 mL Oral TID BM  . lactulose  20 g Oral TID  . mouth rinse  15 mL Mouth Rinse BID  . multivitamin with minerals  1 tablet Oral Daily  . pantoprazole  40 mg Oral BID  . [START ON 01/19/2020] prednisoLONE  10 mg Oral Daily  . rifaximin  550 mg Oral BID  . sodium chloride flush  3 mL Intravenous Q12H   Continuous Infusions: . cefTRIAXone (ROCEPHIN)  IV 2 g (01/18/20 0010)  . sodium chloride       Assessment/Plan: Severe sepsis, abdominal pain : Patient met sepsis criteria based on leukocytosis, tachycardia, tachypnea and lactic acidosis. UTI vs abdominal source of infection.Both urine and blood cultures growing E coli.  CT abdomen and pelvis WO contrast as above. Started on empiric abx (ceftriaxone) for subacute bacterial peritonitis on admission -should cover E coli. Seen by ID today and added po flagyl. Ordered DIC panel given declining platelet counts although unlikely given stable appearance. His blood pressure appears improved with fluid resuscitation and albumin infusions. Appreciate GI  recommendations .Tapered IV fluids to off to keep systolic blood pressure greater than 90 in order to avoid further hemodilution or third spacing..  Can give IV albumin for hypotension prn.  Chronic alcoholic hepatitis with cirrhosis/thrombocytopenia/hepatic encephalopathy:  Recent admission for alcoholic hepatitis with significant hyperbilirubinemia worsened on presentation (26->36), thrombocytopenia (Plt stable at 37 -40 on presentation) -resumed home dose of prednisone, lactulose, rifaximin.He was advised to come the emergency department by [facility] -  requested inpatient consultation.  Labs this morning show improvement in T bili to 24 but worsening blood counts as below  Acute kidney injury: creat at last d/c was 1.06. On arrival this time BUN/cr at 45/1.37.  Secondary to sepsis vs volume depletion-improved to 41/0.7 with  IV fluids.   Acute on Chronic microcytic anemia due to chronic GI blood loss:  Hemoglobin at baseline on presentation but dropped to 7.9 this morning. S/p EGD in recent hospitalization. On protonix IV BID.  Denies ongoing melena/hematochezia . ?Dilutional. Monitor daily hgb, transfuse <7 . No signs of active bleeding but ceratinly at risk for spontaneous bleeding with thrombocytopenia  Chronic thrombocytopenia: Likely secondary to alcohol use/splenomegaly and dilutional effect. Will order DIC panel given ongoing infection/all cell lines down/coagulopathy although clinical picture -not so sick appearing as d/w ID Dr Megan Salon.   No obvious bleeding. Off steroids now. This morning CBC shows worsening platelets to 16 today.. Watch for recurrent signs of bleeding/severe epistaxis-  Will type and screen and transfuse 1 unit Plt as d/w hematologist on call DR Magrinat.   Leucocytosis: secondary to sepsis vs chronic prednisone use. Resolved now. Monitor    DVT prophylaxis: SCD Code Status: Full Family / Patient Communication: Discussed with patient and family at bedside in detail  regarding overall prognosis, high risk of complications including spontaneous internal bleeding, disseminated infection/worsening cell counts/DIC/worsening liver failure and recurrent encephalopathy which he had in previous admission.  Also advised regarding GI recommendations.  Daughter and sister at bedside-the report reviewing my chart and labs/discussions as well as recommendations by specialist.  They wanted some time to think about CODE STATUS.  They are agreeable to palliative care consultation.  Nurse called me back later this afternoon and stated patient discussed with other family members and now wishes DNR status while continuing all other modalities of treatment.  Requested Dr. Domingo Cocking with palliative care team to evaluate patient over the weekend.  Confirmed with patient and family regarding change in code status-DNR order placed--no CPR/shocking or intubation.  He would however like to consider ICU transfer/pressors if needed. Disposition Plan:  Status is: Inpatient  Remains inpatient appropriate because:Inpatient level of care appropriate due to severity of illness, sepsis requiring IV antibiotics, declining cell counts, need for transfusion and supportive management.   Dispo: The patient is from: Home              Anticipated d/c is to: Home              Anticipated d/c date is: > 3 days              Patient currently is not medically stable to d/c.        LOS: 2 days    Time spent: 45 minutes    Guilford Shi, MD [facility] Pager in [place]  If 7PM-7AM, please contact night-coverage [email or web address] 01/18/2020, 3:34 PM

## 2020-01-18 NOTE — Progress Notes (Addendum)
Per shift change report received from Josie Dixon, RN Dr. Earnest Conroy wants platelet transfusion to infuse slowly at 74ml/hr. Patient currently getting platelet transfusion without any complaint, will continue to monitor patient.

## 2020-01-18 NOTE — Consult Note (Signed)
[facility] Infectious Disease    Date of Admission:  01/16/2020           Day 3 ceftriaxone       Reason for Consult: E. coli bacteremia and colonic pneumatosis    Referring Provider: Dr. Guilford Shi  Assessment: It is unclear to me if his E. coli bacteremia as a result of symptomatic urinary tract infection or recurrent intra-abdominal source.  Likewise, it is hard to determine if the colonic pneumatosis is a benign finding or the source of his infection.  I recommend continuing ceftriaxone and adding oral metronidazole better coverage of intra-abdominal anaerobes.  He seems to be improving.  He probably needs about 7 days of total therapy.  Plan: 1. Continue ceftriaxone pending final culture results 2. Start oral metronidazole 3. Please call Dr. Carlyle Basques [phone]) for any infectious disease questions this weekend  Principal Problem:   E coli bacteremia Active Problems:   Pneumatosis intestinalis   Alcoholic cirrhosis of liver with ascites ([place])   Cirrhosis ([place])   Alcoholic hepatitis with ascites   Severe sepsis (HCC)   AKI (acute kidney injury) ([place])   Scheduled Meds: . Chlorhexidine Gluconate Cloth  6 each Topical Q0600  . docusate sodium  100 mg Oral BID  . feeding supplement (PRO-STAT SUGAR FREE 64)  30 mL Oral TID BM  . lactulose  20 g Oral TID  . mouth rinse  15 mL Mouth Rinse BID  . multivitamin with minerals  1 tablet Oral Daily  . pantoprazole  40 mg Oral BID  . phytonadione  10 mg Oral Once  . [START ON 01/19/2020] prednisoLONE  10 mg Oral Daily  . rifaximin  550 mg Oral BID  . sodium chloride flush  3 mL Intravenous Q12H   Continuous Infusions: . albumin human 25 g (01/18/20 0528)  . cefTRIAXone (ROCEPHIN)  IV 2 g (01/18/20 0010)  . sodium chloride     PRN Meds:.ondansetron **OR** ondansetron (ZOFRAN) IV  HPI: Jesse Esparza is a 54 y.o. male with long-term, heavy alcohol use who has alcoholic liver disease.  He was  recently admitted on 01/01/2020 with acute alcoholic hepatitis and decompensated cirrhosis.  He underwent paracentesis.  There was no evidence of peritonitis.  He was treated with steroids and finally discharged on 01/11/2020.  He remained very weak and had abdominal pain with worsening jaundice leading to readmission on 01/16/2020.  His temperature was only 99.7 but he was hypotensive.  Ultrasound revealed only trace ascites so repeat paracentesis was not attempted.  He was started on empiric ceftriaxone.  His blood pressure finally came up following IV fluids and albumin.  1 of 2 admission blood cultures is growing E. coli with antibiotic susceptibilities pending.  Urine culture is also growing E. coli.  CT scan of his abdomen and pelvis revealed:  IMPRESSION: 1. Findings suspicious for pneumatosis in the ascending colon, with a few adjacent foci of extraluminal free air. Small amount of mesenteric venous gas is suspected in the region of pneumatosis. However, there is no significant colonic wall thickening or inflammatory change in the region of pneumatosis and extraluminal air. This may be benign pneumatosis, and free air in the absence of bowel perforation has been described, however this is indeterminate on imaging. Close clinical follow-up is recommended.   Review of Systems: Review of Systems  Constitutional: Positive for malaise/fatigue. Negative for chills, diaphoresis, fever and weight loss.  Respiratory: Negative for cough  and shortness of breath.   Cardiovascular: Negative for chest pain.  Gastrointestinal: Positive for constipation. Negative for abdominal pain, diarrhea, nausea and vomiting.  Genitourinary: Positive for dysuria.       He describes chronic, mild dysuria that is chronic and unchanged.    Past Medical History:  Diagnosis Date  . Cirrhosis ([place])   . Depression   . Elevated liver function tests   . Gallbladder sludge   . GERD (gastroesophageal reflux disease)   .  Hypercholesteremia   . Hypertension   . Obesity     Social History   Tobacco Use  . Smoking status: Never Smoker  . Smokeless tobacco: Never Used  Substance Use Topics  . Alcohol use: Yes    Alcohol/week: 2.0 - 3.0 standard drinks    Types: 2 - 3 Shots of liquor per week    Comment: last drink 6-7 months ago  . Drug use: No    Family History  Problem Relation Age of Onset  . Hyperlipidemia Father   . Hyperlipidemia Mother   . Colon cancer Neg Hx   . Esophageal cancer Neg Hx   . Rectal cancer Neg Hx   . Stomach cancer Neg Hx    No Known Allergies  OBJECTIVE: Blood pressure 112/69, pulse 87, temperature 98.2 F (36.8 C), temperature source Oral, resp. rate 18, height 5\' 5"  (1.651 m), weight 114.8 kg, SpO2 100 %.  Physical Exam Constitutional:      Comments: He is sitting up in a chair visiting with his wife and daughter. He is jaundiced and tearful.  Cardiovascular:     Rate and Rhythm: Normal rate and regular rhythm.     Heart sounds: No murmur.  Pulmonary:     Effort: Pulmonary effort is normal.     Breath sounds: Normal breath sounds.  Abdominal:     General: There is distension.     Palpations: Abdomen is soft.     Tenderness: There is no abdominal tenderness. There is no right CVA tenderness or left CVA tenderness.  Skin:    Findings: No rash.     Lab Results Lab Results  Component Value Date   WBC 7.7 01/18/2020   HGB 7.8 (L) 01/18/2020   HCT 23.6 (L) 01/18/2020   MCV 115.1 (H) 01/18/2020   PLT 16 (LL) 01/18/2020    Lab Results  Component Value Date   CREATININE 0.60 (L) 01/18/2020   BUN 33 (H) 01/18/2020   NA 135 01/18/2020   K 4.2 01/18/2020   CL 102 01/18/2020   CO2 25 01/18/2020    Lab Results  Component Value Date   ALT 119 (H) 01/18/2020   AST 103 (H) 01/18/2020   GGT 225 (H) 11/07/2017   ALKPHOS 108 01/18/2020   BILITOT 24.6 ([place]) 01/18/2020     Microbiology: Recent Results (from the past 240 hour(s))  Blood Culture (routine x  2)     Status: Abnormal (Preliminary result)   Collection Time: 01/16/20 12:40 PM   Specimen: BLOOD  Result Value Ref Range Status   Specimen Description   Final    BLOOD SITE NOT SPECIFIED Performed at [facility], [place] [address], [place], [place] [address]    Special Requests   Final    BOTTLES DRAWN AEROBIC AND ANAEROBIC Blood Culture adequate volume Performed at [facility] [address] [place], [place] [address]    Culture  Setup Time   Final    IN BOTH AEROBIC AND  ANAEROBIC BOTTLES GRAM NEGATIVE RODS CRITICAL RESULT CALLED TO, READ BACK BY AND VERIFIED WITH: J GRIMSLEY PHARMD 01/17/20 0455 JDW    Culture (A)  Final    ESCHERICHIA COLI SUSCEPTIBILITIES TO FOLLOW Performed at [facility], [place] [address], [place], [place] [address]    Report Status PENDING  Incomplete  Blood Culture ID Panel (Reflexed)     Status: Abnormal   Collection Time: 01/16/20 12:40 PM  Result Value Ref Range Status   Enterococcus species NOT DETECTED NOT DETECTED Final   Listeria monocytogenes NOT DETECTED NOT DETECTED Final   Staphylococcus species NOT DETECTED NOT DETECTED Final   Staphylococcus aureus (BCID) NOT DETECTED NOT DETECTED Final   Streptococcus species NOT DETECTED NOT DETECTED Final   Streptococcus agalactiae NOT DETECTED NOT DETECTED Final   Streptococcus pneumoniae NOT DETECTED NOT DETECTED Final   Streptococcus pyogenes NOT DETECTED NOT DETECTED Final   Acinetobacter baumannii NOT DETECTED NOT DETECTED Final   Enterobacteriaceae species DETECTED (A) NOT DETECTED Final    Comment: Enterobacteriaceae represent a large family of gram-negative bacteria, not a single organism. CRITICAL RESULT CALLED TO, READ BACK BY AND VERIFIED WITH: J GRIMSLEY PHARMD 01/17/20 0455 JDW    Enterobacter cloacae complex NOT DETECTED NOT DETECTED Final   Escherichia coli DETECTED (A) NOT DETECTED Final    Comment: CRITICAL RESULT CALLED TO, READ BACK BY AND VERIFIED  WITH: J GRIMSLEY PHARMD 01/17/20 0455 JDW`    Klebsiella oxytoca NOT DETECTED NOT DETECTED Final   Klebsiella pneumoniae NOT DETECTED NOT DETECTED Final   Proteus species NOT DETECTED NOT DETECTED Final   Serratia marcescens NOT DETECTED NOT DETECTED Final   Carbapenem resistance NOT DETECTED NOT DETECTED Final   Haemophilus influenzae NOT DETECTED NOT DETECTED Final   Neisseria meningitidis NOT DETECTED NOT DETECTED Final   Pseudomonas aeruginosa NOT DETECTED NOT DETECTED Final   Candida albicans NOT DETECTED NOT DETECTED Final   Candida glabrata NOT DETECTED NOT DETECTED Final   Candida krusei NOT DETECTED NOT DETECTED Final   Candida parapsilosis NOT DETECTED NOT DETECTED Final   Candida tropicalis NOT DETECTED NOT DETECTED Final    Comment: Performed at [facility], [place] [address] [place], [place] [address]  Urine culture     Status: Abnormal (Preliminary result)   Collection Time: 01/16/20 12:44 PM   Specimen: In/Out Cath Urine  Result Value Ref Range Status   Specimen Description   Final    IN/OUT CATH URINE Performed at [facility] [address], [place], [place] [address]    Special Requests   Final    NONE Performed at [facility], [place] [address] [place], [place] [address]    Culture (A)  Final    >=100,000 COLONIES/mL ESCHERICHIA COLI SUSCEPTIBILITIES TO FOLLOW Performed at [facility], [place] [address] [place], [place] [address]    Report Status PENDING  Incomplete  SARS Coronavirus 2 by RT PCR (hospital order, performed in [facility] hospital lab) Nasopharyngeal Nasopharyngeal Swab     Status: None   Collection Time: 01/16/20 12:57 PM   Specimen: Nasopharyngeal Swab  Result Value Ref Range Status   SARS Coronavirus 2 NEGATIVE NEGATIVE Final    Comment: (NOTE) SARS-CoV-2 target nucleic acids are NOT DETECTED. The SARS-CoV-2 RNA is generally detectable in upper and lower respiratory specimens during the  acute phase of infection. The lowest concentration of SARS-CoV-2 viral copies this assay can detect is 250 copies / mL. A negative result  does not preclude SARS-CoV-2 infection and should not be used as the sole basis for treatment or other patient management decisions.  A negative result may occur with improper specimen collection / handling, submission of specimen other than nasopharyngeal swab, presence of viral mutation(s) within the areas targeted by this assay, and inadequate number of viral copies (<250 copies / mL). A negative result must be combined with clinical observations, patient history, and epidemiological information. Fact Sheet for Patients:   StrictlyIdeas.no Fact Sheet for Healthcare Providers: BankingDealers.co.za This test is not yet approved or cleared  by the [place] FDA and has been authorized for detection and/or diagnosis of SARS-CoV-2 by FDA under an Emergency Use Authorization (EUA).  This EUA will remain in effect (meaning this test can be used) for the duration of the COVID-19 declaration under Section 564(b)(1) of the Act, 21 U.S.C. section 360bbb-3(b)(1), unless the authorization is terminated or revoked sooner. Performed at [facility], [place] [address], [place], [place] [address]   MRSA PCR Screening     Status: None   Collection Time: 01/16/20  7:04 PM   Specimen: Nasal Mucosa; Nasopharyngeal  Result Value Ref Range Status   MRSA by PCR NEGATIVE NEGATIVE Final    Comment:        The GeneXpert MRSA Assay (FDA approved for NASAL specimens only), is one component of a comprehensive MRSA colonization surveillance program. It is not intended to diagnose MRSA infection nor to guide or monitor treatment for MRSA infections. Performed at [facility], [place] [address] [place], [place] [address]   Blood Culture (routine x 2)     Status: None (Preliminary result)     Collection Time: 01/16/20  7:51 PM   Specimen: BLOOD  Result Value Ref Range Status   Specimen Description   Final    BLOOD BLOOD RIGHT ARM Performed at [facility] [address] [place], [place] [address]    Special Requests   Final    BOTTLES DRAWN AEROBIC AND ANAEROBIC Blood Culture adequate volume Performed at [facility] [address], [place], [place] [address]    Culture   Final    NO GROWTH 2 DAYS Performed at [facility] [address], [place], [place] [address]    Report Status PENDING  Incomplete    Michel Bickers, MD [facility] [place] [facility] [phone] pager   [phone] cell 01/18/2020, 10:53 AM

## 2020-01-18 NOTE — Progress Notes (Signed)
Progress Note   Subjective  Patient alert and oriented. States he is feeling somewhat better, does have some ongoing abdominal discomfort which comes and goes. He thinks he had some mild blood in the stool in recent days, nothing overnight.    Objective   Vital signs in last 24 hours: Temp:  [97.6 F (36.4 C)-98.5 F (36.9 C)] 98.2 F (36.8 C) (05/21 0532) Pulse Rate:  [80-97] 87 (05/21 0532) Resp:  [18-29] 18 (05/21 0532) BP: (96-142)/(43-86) 112/69 (05/21 0532) SpO2:  [98 %-100 %] 100 % (05/21 0532) Weight:  WA:[id number] kg] 114.8 kg (05/21 0436) Last BM Date: 01/17/20 General:    hispanic male in NAD Heart:  Regular rate and rhythm; no murmurs Lungs: Respirations even and unlabored, l Abdomen:  Soft, mild tenderness in upper abdomen, protuberant Extremities:  (+)2 edema LE. Neurologic:  Alert and oriented,  grossly normal neurologically. Psych:  Cooperative. Normal mood and affect.  Intake/Output from previous day: 05/20 0701 - 05/21 0700 In: 846.8 [P.O.:120; IV Piggyback:726.8] Out: 2750 [Urine:2750] Intake/Output this shift: No intake/output data recorded.  Lab Results: Recent Labs    01/16/20 1226 01/16/20 1226 01/17/20 0253 01/17/20 0253 01/17/20 0908 01/17/20 1646 01/18/20 0041  WBC 17.0*  --  10.7*  --   --   --  7.7  HGB 11.7*   < > 7.9*   < > 7.9* 7.8* 7.8*  HCT 34.7*   < > 23.9*   < > 23.4* 23.6* 23.6*  PLT 37*  --  21*  --   --   --  16*   < > = values in this interval not displayed.   BMET Recent Labs    01/17/20 0253 01/18/20 0041 01/18/20 0505  NA 130* 134* 135  K 4.5 4.0 4.2  CL 96* 101 102  CO2 24 25 25   GLUCOSE 119* 120* 102*  BUN 41* 36* 33*  CREATININE 0.77 0.71 0.60*  CALCIUM 7.8* 8.2* 8.6*   LFT Recent Labs    01/18/20 0505  PROT 6.2*  ALBUMIN 4.0  AST 103*  ALT 119*  ALKPHOS 108  BILITOT 24.6*   PT/INR Recent Labs    01/16/20 1226 01/17/20 0253  LABPROT 27.3* 34.4*  INR 2.6* 3.5*    Studies/Results: CT  ABDOMEN PELVIS WO CONTRAST  Result Date: 01/16/2020 CLINICAL DATA:  Abdominal pain and distension. History of cirrhosis. EXAM: CT ABDOMEN AND PELVIS WITHOUT CONTRAST TECHNIQUE: Multidetector CT imaging of the abdomen and pelvis was performed following the standard protocol without IV contrast. COMPARISON:  Most recent CT fifteen days ago 01/01/2020. FINDINGS: Lower chest: Trace right pleural effusion/thickening. Rounded density in the lower most right lung base is new from prior exam and likely atelectasis, series 6, image 15. Hepatobiliary: Cirrhotic hepatic morphology with nodular contours. No obvious focal lesion on noncontrast exam. Small gallstones within the gallbladder. No biliary dilatation. Pancreas: No ductal dilatation or inflammation. Spleen: Splenomegaly with spleen measuring 14 x 6.8 x 14.6 cm (volume = 730 cm^3). Findings are grossly stable allowing for differences in caliper placement. Adrenals/Urinary Tract: Minimal thickening of the right adrenal gland without dominant nodule. No hydronephrosis. No renal or ureteral calculi. Urinary bladder is physiologically distended. Stomach/Bowel: Stomach is unremarkable. Administered enteric contrast reaches the mid distal small bowel. Mild wall thickening of the duodenum and proximal small bowel loops. No obstruction. Portions of normal appendix are visualized, partially obscured by adjacent ascites. There is mottled air in the ascending colon suspicious for pneumatosis, with a  few adjacent foci of extraluminal air, for example series 2, image 48 and 47. Small focus of air centrally may be within the mesenteric veins versus mesenteric fat, series 2, image 46. there is no definite associated colonic wall thickening or pericolonic edema. Formed stool throughout the more distal colon with scattered diverticula. No focal diverticulitis. Vascular/Lymphatic: Aortic atherosclerosis. Perisplenic collaterals with splenorenal shunting were better appreciated on prior  contrast-enhanced exam. Tiny focus of air in the right mesentery which may be intravascular versus in the mesenteric fat, series 2, image 46. possible additional tiny mesenteric venous focus in the central abdomen, series 2, image 37. Prominent periportal lymph nodes which are likely reactive. Reproductive: Stable prostate gland. Other: Small volume abdominopelvic ascites, greatest volume in the right upper and lower quadrants. Tiny foci of free air adjacent to the ascending colon in the right abdomen. No evidence of intra-abdominal abscess. There is a small fat containing umbilical hernia. Musculoskeletal: Degenerative disc disease and facet hypertrophy of the lumbar spine. There are no acute or suspicious osseous abnormalities. IMPRESSION: 1. Findings suspicious for pneumatosis in the ascending colon, with a few adjacent foci of extraluminal free air. Small amount of mesenteric venous gas is suspected in the region of pneumatosis. However, there is no significant colonic wall thickening or inflammatory change in the region of pneumatosis and extraluminal air. This may be benign pneumatosis, and free air in the absence of bowel perforation has been described, however this is indeterminate on imaging. Close clinical follow-up is recommended. 2. Mild wall thickening of the duodenum and proximal small bowel loops, likely reactive. 3. Cirrhosis with portal hypertension, splenomegaly, and small volume abdominopelvic ascites. 4. Cholelithiasis. Aortic Atherosclerosis (ICD10-I70.0). These results were called by telephone at the time of interpretation on 01/16/2020 at 5:34 pm to Dr Darliss Cheney , who verbally acknowledged these results. Electronically Signed   By: Keith Rake M.D.   On: 01/16/2020 17:37   US Abdomen Limited  Result Date: 01/16/2020 CLINICAL DATA:  History of cirrhosis. Please perform ascites search ultrasound and ultrasound-guided paracentesis as indicated. EXAM: LIMITED ABDOMEN ULTRASOUND FOR  ASCITES TECHNIQUE: Limited ultrasound survey for ascites was performed in all four abdominal quadrants. COMPARISON:  Ultrasound-guided paracentesis-01/08/2020 (yielding 1.2 L peritoneal fluid) FINDINGS: Sonographic evaluation of the abdomen demonstrates a trace amount intra-abdominal ascites within the bilateral lower abdominal quadrants, too small to allow for safe ultrasound-guided paracentesis. IMPRESSION: Trace amount of intra-abdominal ascites, too small to allow for safe ultrasound-guided paracentesis. No paracentesis attempted. Electronically Signed   By: Sandi Mariscal M.D.   On: 01/16/2020 16:55   DG Chest Port 1 View  Result Date: 01/16/2020 CLINICAL DATA:  Weak. Additional provided: Patient reports weakness, abdominal distension that is more than usual, intermittent dizziness when standing, history of liver failure. EXAM: PORTABLE CHEST 1 VIEW COMPARISON:  Prior chest radiograph 01/08/2020 FINDINGS: Borderline cardiomegaly, unchanged. No appreciable airspace consolidation within the lungs. Redemonstrated mild scarring and/or atelectasis within the left lung base. No evidence of pleural effusion or pneumothorax. No acute bony abnormality identified. IMPRESSION: No evidence of acute cardiopulmonary abnormality. Borderline cardiomegaly, unchanged. Redemonstrated mild scarring and/or atelectasis within the left lung base. Electronically Signed   By: Kellie Simmering DO   On: 01/16/2020 13:27       Assessment / Plan:    54 year old male with a history of alcoholism, last drink 4 months ago, with decompensated cirrhosis, recently admitted with suspected severe acute alcoholic hepatitis with hyperbilirubinemia and anasarca.  He was given a course of prednisolone, unfortunately  readmitted with worsening liver function found to be septic with E. coli.  He has been given antibiotics and his white blood cell count has improved as well as his renal function, bilirubinemia slightly improved but INR high.  I  suspect he has had intra-abdominal infection, CT scan findings noted, perhaps he had SBP leading to worsening liver function in the setting of steroids - attempt at paracentesis previously was not successful.  It does not appear prednisolone has helped his liver function and would recommend stopping it, weaning down as soon as possible.  He is critically ill with acute on chronic liver failure.  In light of his alcohol use, that would normally prohibit liver transplantation however some centers are providing liver transplant for severe alcoholic hepatitis and could theoretically still be a candidate.  However he does not have healthcare insurance currently and without that he will not be considered for transplant anywhere. I had a lengthy discussion with the patient, his wife, and his daughter this morning.  He has high risk for mortality during this hospitalization with his severe liver disease.  Improving renal function and markers of infection are encouraging however.  I'm recommending a social work consult so they can work on getting him insurance if he is to ever be considered a candidate for liver transplant if he survives this hospitalization.  Patient's wife states she will start working on this.  Otherwise we'll continue supportive care with albumin infusion, antibiotics, vitamin K.  I will recheck his INR this morning and that is pending, would check daily.  I recommend we stop the prednisolone as soon as possible given his sepsis and that is has not helped with his liver function.  Can give vitamin K if he has not had that yet.  He has severe thrombocytopenia and stable anemia.  No evidence of active bleeding at this time however should he have any bleeding he will warrant a platelet transfusion.  Regarding CT scan findings agree that he is not a surgical candidate and would continue antibiotics.  Several minutes spent discussing all of these issues with the family, answered all their questions to the  best my ability. Palliative care evaluation still appropriate however I don't think patient nor family are ready for that based on discussion today.  Recommend: - social work consult to start application process for insurance as outlined if he has any chance for liver transplant if he survives this hospitalization - continue antibiotics for E coli sepsis - wean down and stop prednisone as soon as possible, will dose reduce today - continue to trend INR daily and liver and kidney function - continue lactulose and rifaximin, mental status appears okay today - continue IV albumin infusions as scheduled, no other IVF - vitamin K for coagulopathy - of note ceruloplasmin mildly low which is nonspecific, can check 24 hour urine copper but think Wilson's less likely  Call with questions.  Waleska Cellar, MD [facility]

## 2020-01-19 DIAGNOSIS — R1010 Upper abdominal pain, unspecified: Secondary | ICD-10-CM

## 2020-01-19 DIAGNOSIS — R109 Unspecified abdominal pain: Secondary | ICD-10-CM

## 2020-01-19 DIAGNOSIS — B9620 Unspecified Escherichia coli [E. coli] as the cause of diseases classified elsewhere: Secondary | ICD-10-CM

## 2020-01-19 DIAGNOSIS — Z7189 Other specified counseling: Secondary | ICD-10-CM

## 2020-01-19 DIAGNOSIS — R7881 Bacteremia: Secondary | ICD-10-CM

## 2020-01-19 DIAGNOSIS — Z515 Encounter for palliative care: Secondary | ICD-10-CM

## 2020-01-19 LAB — CBC WITH DIFFERENTIAL/PLATELET
Abs Immature Granulocytes: 0.18 10*3/uL — ABNORMAL HIGH (ref 0.00–0.07)
Basophils Absolute: 0 10*3/uL (ref 0.0–0.1)
Basophils Relative: 0 %
Eosinophils Absolute: 0.1 10*3/uL (ref 0.0–0.5)
Eosinophils Relative: 1 %
HCT: 24.1 % — ABNORMAL LOW (ref 39.0–52.0)
Hemoglobin: 8 g/dL — ABNORMAL LOW (ref 13.0–17.0)
Immature Granulocytes: 2 %
Lymphocytes Relative: 4 %
Lymphs Abs: 0.3 10*3/uL — ABNORMAL LOW (ref 0.7–4.0)
MCH: 38.5 pg — ABNORMAL HIGH (ref 26.0–34.0)
MCHC: 33.2 g/dL (ref 30.0–36.0)
MCV: 115.9 fL — ABNORMAL HIGH (ref 80.0–100.0)
Monocytes Absolute: 1 10*3/uL (ref 0.1–1.0)
Monocytes Relative: 13 %
Neutro Abs: 6.3 10*3/uL (ref 1.7–7.7)
Neutrophils Relative %: 80 %
Platelets: 23 10*3/uL — CL (ref 150–400)
RBC: 2.08 MIL/uL — ABNORMAL LOW (ref 4.22–5.81)
RDW: 17 % — ABNORMAL HIGH (ref 11.5–15.5)
WBC: 7.9 10*3/uL (ref 4.0–10.5)
nRBC: 0 % (ref 0.0–0.2)

## 2020-01-19 LAB — BPAM PLATELET PHERESIS
Blood Product Expiration Date: 202105212359
ISSUE DATE / TIME: 202105211709
Unit Type and Rh: 7300

## 2020-01-19 LAB — COMPREHENSIVE METABOLIC PANEL
ALT: 119 U/L — ABNORMAL HIGH (ref 0–44)
AST: 136 U/L — ABNORMAL HIGH (ref 15–41)
Albumin: 3.3 g/dL — ABNORMAL LOW (ref 3.5–5.0)
Alkaline Phosphatase: 96 U/L (ref 38–126)
Anion gap: 9 (ref 5–15)
BUN: 30 mg/dL — ABNORMAL HIGH (ref 6–20)
CO2: 22 mmol/L (ref 22–32)
Calcium: 8.7 mg/dL — ABNORMAL LOW (ref 8.9–10.3)
Chloride: 104 mmol/L (ref 98–111)
Creatinine, Ser: <0.3 mg/dL — ABNORMAL LOW (ref 0.61–1.24)
Glucose, Bld: 120 mg/dL — ABNORMAL HIGH (ref 70–99)
Potassium: 5.6 mmol/L — ABNORMAL HIGH (ref 3.5–5.1)
Sodium: 135 mmol/L (ref 135–145)
Total Bilirubin: 25.7 mg/dL (ref 0.3–1.2)
Total Protein: 5.8 g/dL — ABNORMAL LOW (ref 6.5–8.1)

## 2020-01-19 LAB — CULTURE, BLOOD (ROUTINE X 2): Special Requests: ADEQUATE

## 2020-01-19 LAB — URINE CULTURE: Culture: >=100000 — AB

## 2020-01-19 LAB — PROTIME-INR
INR: 3.3 — ABNORMAL HIGH (ref 0.8–1.2)
Prothrombin Time: 32.9 seconds — ABNORMAL HIGH (ref 11.4–15.2)

## 2020-01-19 LAB — PREPARE PLATELET PHERESIS: Unit division: 0

## 2020-01-19 LAB — APTT: aPTT: 54 seconds — ABNORMAL HIGH (ref 24–36)

## 2020-01-19 MED ORDER — SODIUM ZIRCONIUM CYCLOSILICATE 10 G PO PACK
10.0000 g | PACK | Freq: Once | ORAL | Status: AC
  Administered 2020-01-19: 10 g via ORAL
  Filled 2020-01-19: qty 1

## 2020-01-19 MED ORDER — FUROSEMIDE 20 MG PO TABS
20.0000 mg | ORAL_TABLET | Freq: Every day | ORAL | Status: DC
  Administered 2020-01-19 – 2020-01-22 (×4): 20 mg via ORAL
  Filled 2020-01-19 (×4): qty 1

## 2020-01-19 MED ORDER — PHYTONADIONE 5 MG PO TABS
10.0000 mg | ORAL_TABLET | Freq: Every day | ORAL | Status: AC
  Administered 2020-01-19 – 2020-01-21 (×3): 10 mg via ORAL
  Filled 2020-01-19 (×3): qty 2

## 2020-01-19 MED ORDER — METRONIDAZOLE 500 MG PO TABS
500.0000 mg | ORAL_TABLET | Freq: Three times a day (TID) | ORAL | Status: DC
  Administered 2020-01-19 – 2020-01-21 (×6): 500 mg via ORAL
  Filled 2020-01-19 (×6): qty 1

## 2020-01-19 NOTE — Progress Notes (Signed)
[facility]                                                                              Patient Demographics  Jesse Esparza, is a 54 y.o. male, DOB - 1966/07/18, GYJ:[id number]  Admit date - 01/16/2020   Admitting Physician Darliss Cheney, MD  Outpatient Primary MD for the patient is Charlott Rakes, MD  Outpatient specialists:   LOS - 3  days   Medical records reviewed and are as summarized below:    Chief Complaint  Patient presents with  . Weakness  . Abdominal Distention       Brief summary   Admit date:01/16/2020 54 y.o.malewith alcoholic cirrhosis, GERD, hyperlipidemia, hypertension, obesity who was recently hospitalized from 5/4/-01/11/2020 for acute alcoholic hepatitis, underwent paracentesis on 5/5 with drainage of 1.4 L of yellow ascitic fluid,  also had EGD on 5/14 subsequently discharged on twice daily Protonix; called GI office on 5/19 c/o crampy RLQ abdominal pain 4/10, feeling very weak, dizzy with some subjective fever and chills.He was advised to come to the emergency department.  ED Course: Afebrile,tachycardic, tachypneic and hypotensive with blood pressure of 89/61. He was given a liter of IV fluid with not much improvement in his blood pressure. 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 Elevated lactic acid. Elevated creatinine of 1.37. Worsened hyperbilirubinemia as well as LFTs. Abdominal x-ray was done which was unremarkable.  Hospital course:Patient underwent CT abd showing benign pneumatosis in the ascending colon, with a few adjacent foci of extraluminal free air, suspected small amount of mesenteric venous gas  and free air in the absence of bowel perforation. Cholelithiasis,Cirrhosis with portal hypertension, splenomegaly, and small volume abdominopelvic ascites not enough to tap per IR (initially requested  diagnostic tap to r/o peritonitis).    Assessment & Plan    Principal Problem:  Severe sepsis, E. coli bacteremia, E. coli UTI -Patient met sepsis criteria at the time of admission based on leukocytosis, tachycardia, tachypnea, lactic acidosis. -CT abdomen on admission showed benign pneumatosis in the ascending colon with few adjacent foci of extraluminal free air, cirrhosis with portal hypertension -Patient was placed on empiric IV Rocephin for subacute bacterial peritonitis on admission -Blood cultures, urine cultures positive for E. coli, sensitive to IV Rocephin -ID consulted, appreciate recommendations, added oral metronidazole -GI following, can give IV albumin for hypotension as needed  Chronic alcoholic hepatitis with cirrhosis, decompensated, intra-abdominal infection -Recent admission with alcoholic hepatitis, significant hyperbilirubinemia, worsened on presentation 26-> 36, thrombocytopenia -Patient was placed on home dose of prednisone, lactulose, rifaximin.  No significant improvement in the labs. -Discussed with Dr. Ardis Hughs, will continue prednisolone 10 mg today and tomorrow -Placed on oral metronidazole for anaerobic coverage -Placed on Lasix 20 mg daily, hopefully will be able to tolerate with BP, has 3+ pitting edema, anasarca due to third spacing  Chronic thrombocytopenia, acute on chronic microcytic anemia, chronic GI blood loss -Recent EGD previous admission on 5/14-portal hypertensive gastropathy, grade 1 varices, no active bleeding -Epistaxis yesterday, received 1 pheresis of platelets, platelet count improved from 16 K on 5/21  to 23K, -No further bleeding, INR still 3.3, improving, placed on vitamin K 10 mg p.o. daily for 3 days   Acute kidney injury -Creatinine 1.3 at the time of admission was 1.0 at the time of discharge on 5/14 -Creatinine currently improved, starting on low-dose oral Lasix, follow creatinine function  Hyperkalemia -Potassium 5.6, started  on oral Lasix today, Lokelma x1  Leukocytosis -Secondary to sepsis versus chronic prednisone use, resolved  Dizziness -Patient has been complaining of dizziness since the previous admission.  States he feels debilitated and unable to walk due to dizziness and acute medical issues. -2D echo 01/02/2020 had shown EF 70 to 75% with grade 1 diastolic dysfunction, no aortic stenosis - No focal neurological deficits, no vertigo  Code Status: Full code DVT Prophylaxis: SCDs Family Communication: Discussed all imaging results, lab results, explained to the patient and patient's family members at the bedside   Disposition Plan:     Status is: Inpatient  Remains inpatient appropriate because:Inpatient level of care appropriate due to severity of illness   Dispo: The patient is from: Home              Anticipated d/c is to: Home              Anticipated d/c date is: > 3 days              Patient currently is not medically stable to d/c.       Time Spent in minutes 35 minutes  Procedures:  CT abdomen pelvis Consultants:   Gastroenterology  Antimicrobials:   Anti-infectives (From admission, onward)   Start     Dose/Rate Route Frequency Ordered Stop   01/19/20 1400  metroNIDAZOLE (FLAGYL) tablet 500 mg     500 mg Oral Every 8 hours 01/19/20 0930     01/17/20 2200  cefTRIAXone (ROCEPHIN) 2 g in sodium chloride 0.9 % 100 mL IVPB     2 g 200 mL/hr over 30 Minutes Intravenous Every 24 hours 01/16/20 2051     01/16/20 2200  ceFEPIme (MAXIPIME) 2 g in sodium chloride 0.9 % 100 mL IVPB  Status:  Discontinued     2 g 200 mL/hr over 30 Minutes Intravenous Every 8 hours 01/16/20 1451 01/16/20 1709   01/16/20 2200  rifaximin (XIFAXAN) tablet 550 mg     550 mg Oral 2 times daily 01/16/20 1455     01/16/20 1800  cefTRIAXone (ROCEPHIN) 2 g in sodium chloride 0.9 % 100 mL IVPB  Status:  Discontinued     2 g 200 mL/hr over 30 Minutes Intravenous Every 24 hours 01/16/20 1709 01/16/20 2051    01/16/20 1300  ceFEPIme (MAXIPIME) 2 g in sodium chloride 0.9 % 100 mL IVPB     2 g 200 mL/hr over 30 Minutes Intravenous  Once 01/16/20 1249 01/16/20 1348          Medications  Scheduled Meds: . Chlorhexidine Gluconate Cloth  6 each Topical Q0600  . docusate sodium  100 mg Oral BID  . feeding supplement (PRO-STAT SUGAR FREE 64)  30 mL Oral TID BM  . furosemide  20 mg Oral Daily  . lactulose  20 g Oral TID  . mouth rinse  15 mL Mouth Rinse BID  . metroNIDAZOLE  500 mg Oral Q8H  . multivitamin with minerals  1 tablet Oral Daily  . pantoprazole  40 mg Oral BID  . phytonadione  10 mg Oral Daily  . prednisoLONE  10 mg Oral Daily  .  rifaximin  550 mg Oral BID  . sodium chloride flush  3 mL Intravenous Q12H   Continuous Infusions: . cefTRIAXone (ROCEPHIN)  IV 2 g (01/19/20 0131)  . sodium chloride     PRN Meds:.ondansetron **OR** ondansetron (ZOFRAN) IV, traMADol      Subjective:   Jesse Esparza was seen and examined today.  Continues to feel debilitated and deconditioned, DC.  No further epistaxis this morning.  Still has significant swelling in his legs.  Distended abdomen.  No fevers  Objective:   Vitals:   01/18/20 1730 01/18/20 2134 01/19/20 0110 01/19/20 0525  BP: 124/74 127/68 (!) 148/83 (!) 145/77  Pulse: 92 94 96 97  Resp: _0 Temp: 98 F (36.7 C) 97.9 F (36.6 C) (!) 97.5 F (36.4 C) 98.1 F (36.7 C)  TempSrc:  Oral Oral Oral  SpO2:  97% 95% 96%  Weight:      Height:        Intake/Output Summary (Last 24 hours) at 01/19/2020 1236 Last data filed at 01/19/2020 0100 Gross per 24 hour  Intake 706.25 ml  Output 350 ml  Net 356.25 ml     Wt Readings from Last 3 Encounters:  01/18/20 114.8 kg  01/11/20 115 kg  11/07/17 99.3 kg     Exam  General: Alert and oriented x 3, NAD, scleral icterus  Cardiovascular: S1 S2 auscultated, no murmurs, RRR  Respiratory: Clear to auscultation bilaterally, no wheezing, rales or  rhonchi  Gastrointestinal: Soft, nontender,distended, + bowel sounds  Ext: 3+ pedal edema bilaterally  Neuro: No new deficits  Musculoskeletal: No digital cyanosis, clubbing  Skin: No rashes  Psych: Normal affect and demeanor, alert and oriented x3    Data Reviewed:  I have personally reviewed following labs and imaging studies  Micro Results Recent Results (from the past 240 hour(s))  Blood Culture (routine x 2)     Status: Abnormal   Collection Time: 01/16/20 12:40 PM   Specimen: BLOOD  Result Value Ref Range Status   Specimen Description   Final    BLOOD SITE NOT SPECIFIED Performed at [facility], [address], [place], [place] [address]    Special Requests   Final    BOTTLES DRAWN AEROBIC AND ANAEROBIC Blood Culture adequate volume Performed at [facility] [address] [place], [place] [address]    Culture  Setup Time   Final    IN BOTH AEROBIC AND ANAEROBIC BOTTLES GRAM NEGATIVE RODS CRITICAL RESULT CALLED TO, READ BACK BY AND VERIFIED WITH: Lavell Luster [facility] 01/17/20 0455 JDW Performed at [facility], [place] [address], [place], [place] [address]    Culture ESCHERICHIA COLI (A)  Final   Report Status 01/19/2020 FINAL  Final   Organism ID, Bacteria ESCHERICHIA COLI  Final      Susceptibility   Escherichia coli - MIC*    AMPICILLIN >=32 RESISTANT Resistant     CEFAZOLIN <=4 SENSITIVE Sensitive     CEFEPIME <=1 SENSITIVE Sensitive     CEFTAZIDIME <=1 SENSITIVE Sensitive     CEFTRIAXONE <=1 SENSITIVE Sensitive     CIPROFLOXACIN <=0.25 SENSITIVE Sensitive     GENTAMICIN >=16 RESISTANT Resistant     IMIPENEM <=0.25 SENSITIVE Sensitive     TRIMETH/SULFA >=320 RESISTANT Resistant     AMPICILLIN/SULBACTAM 16 INTERMEDIATE Intermediate     PIP/TAZO <=4 SENSITIVE Sensitive     * ESCHERICHIA COLI  Blood Culture ID Panel (Reflexed)  Status: Abnormal   Collection Time: 01/16/20 12:40 PM  Result Value Ref Range Status    Enterococcus species NOT DETECTED NOT DETECTED Final   Listeria monocytogenes NOT DETECTED NOT DETECTED Final   Staphylococcus species NOT DETECTED NOT DETECTED Final   Staphylococcus aureus (BCID) NOT DETECTED NOT DETECTED Final   Streptococcus species NOT DETECTED NOT DETECTED Final   Streptococcus agalactiae NOT DETECTED NOT DETECTED Final   Streptococcus pneumoniae NOT DETECTED NOT DETECTED Final   Streptococcus pyogenes NOT DETECTED NOT DETECTED Final   Acinetobacter baumannii NOT DETECTED NOT DETECTED Final   Enterobacteriaceae species DETECTED (A) NOT DETECTED Final    Comment: Enterobacteriaceae represent a large family of gram-negative bacteria, not a single organism. CRITICAL RESULT CALLED TO, READ BACK BY AND VERIFIED WITH: J GRIMSLEY PHARMD 01/17/20 0455 JDW    Enterobacter cloacae complex NOT DETECTED NOT DETECTED Final   Escherichia coli DETECTED (A) NOT DETECTED Final    Comment: CRITICAL RESULT CALLED TO, READ BACK BY AND VERIFIED WITH: J GRIMSLEY PHARMD 01/17/20 0455 JDW`    Klebsiella oxytoca NOT DETECTED NOT DETECTED Final   Klebsiella pneumoniae NOT DETECTED NOT DETECTED Final   Proteus species NOT DETECTED NOT DETECTED Final   Serratia marcescens NOT DETECTED NOT DETECTED Final   Carbapenem resistance NOT DETECTED NOT DETECTED Final   Haemophilus influenzae NOT DETECTED NOT DETECTED Final   Neisseria meningitidis NOT DETECTED NOT DETECTED Final   Pseudomonas aeruginosa NOT DETECTED NOT DETECTED Final   Candida albicans NOT DETECTED NOT DETECTED Final   Candida glabrata NOT DETECTED NOT DETECTED Final   Candida krusei NOT DETECTED NOT DETECTED Final   Candida parapsilosis NOT DETECTED NOT DETECTED Final   Candida tropicalis NOT DETECTED NOT DETECTED Final    Comment: Performed at [facility], [place] [address], [place], [place] [address]  Urine culture     Status: Abnormal   Collection Time: 01/16/20 12:44 PM   Specimen: In/Out Cath Urine  Result Value Ref  Range Status   Specimen Description   Final    IN/OUT CATH URINE Performed at [facility] [address] [place], [place] [address]    Special Requests   Final    NONE Performed at [facility], [place] [address] [place], [place] [address]    Culture (A)  Final    >=100,000 COLONIES/mL ESCHERICHIA COLI MULTI-DRUG RESISTANT ORGANISM    Report Status 01/19/2020 FINAL  Final   Organism ID, Bacteria ESCHERICHIA COLI (A)  Final      Susceptibility   Escherichia coli - MIC*    AMPICILLIN >=32 RESISTANT Resistant     CEFAZOLIN <=4 SENSITIVE Sensitive     CEFTRIAXONE <=1 SENSITIVE Sensitive     CIPROFLOXACIN <=0.25 SENSITIVE Sensitive     GENTAMICIN >=16 RESISTANT Resistant     IMIPENEM <=0.25 SENSITIVE Sensitive     NITROFURANTOIN <=16 SENSITIVE Sensitive     TRIMETH/SULFA >=320 RESISTANT Resistant     AMPICILLIN/SULBACTAM 16 INTERMEDIATE Intermediate     PIP/TAZO <=4 SENSITIVE Sensitive     * >=100,000 COLONIES/mL ESCHERICHIA COLI  SARS Coronavirus 2 by RT PCR (hospital order, performed in [facility] hospital lab) Nasopharyngeal Nasopharyngeal Swab     Status: None   Collection Time: 01/16/20 12:57 PM   Specimen: Nasopharyngeal Swab  Result Value Ref Range Status   SARS Coronavirus 2 NEGATIVE NEGATIVE Final    Comment: (NOTE) SARS-CoV-2 target nucleic acids are NOT DETECTED. The SARS-CoV-2 RNA is generally detectable in upper  and lower respiratory specimens during the acute phase of infection. The lowest concentration of SARS-CoV-2 viral copies this assay can detect is 250 copies / mL. A negative result does not preclude SARS-CoV-2 infection and should not be used as the sole basis for treatment or other patient management decisions.  A negative result may occur with improper specimen collection / handling, submission of specimen other than nasopharyngeal swab, presence of viral mutation(s) within the areas targeted by this assay, and  inadequate number of viral copies (<250 copies / mL). A negative result must be combined with clinical observations, patient history, and epidemiological information. Fact Sheet for Patients:   StrictlyIdeas.no Fact Sheet for Healthcare Providers: BankingDealers.co.za This test is not yet approved or cleared  by the [place] FDA and has been authorized for detection and/or diagnosis of SARS-CoV-2 by FDA under an Emergency Use Authorization (EUA).  This EUA will remain in effect (meaning this test can be used) for the duration of the COVID-19 declaration under Section 564(b)(1) of the Act, 21 U.S.C. section 360bbb-3(b)(1), unless the authorization is terminated or revoked sooner. Performed at [facility], [place] [address] [place], [place] [address]   MRSA PCR Screening     Status: None   Collection Time: 01/16/20  7:04 PM   Specimen: Nasal Mucosa; Nasopharyngeal  Result Value Ref Range Status   MRSA by PCR NEGATIVE NEGATIVE Final    Comment:        The GeneXpert MRSA Assay (FDA approved for NASAL specimens only), is one component of a comprehensive MRSA colonization surveillance program. It is not intended to diagnose MRSA infection nor to guide or monitor treatment for MRSA infections. Performed at [facility], [place] [address], [place], [place] [address]   Blood Culture (routine x 2)     Status: None (Preliminary result)   Collection Time: 01/16/20  7:51 PM   Specimen: BLOOD  Result Value Ref Range Status   Specimen Description   Final    BLOOD BLOOD RIGHT ARM Performed at [facility] [address], [place], [place] [address]    Special Requests   Final    BOTTLES DRAWN AEROBIC AND ANAEROBIC Blood Culture adequate volume Performed at [facility] [address], [place], [place] [address]    Culture   Final    NO GROWTH 3 DAYS Performed at  [facility], [place] [address] [place], [place] [address]    Report Status PENDING  Incomplete    Radiology Reports CT ABDOMEN PELVIS WO CONTRAST  Result Date: 01/16/2020 CLINICAL DATA:  Abdominal pain and distension. History of cirrhosis. EXAM: CT ABDOMEN AND PELVIS WITHOUT CONTRAST TECHNIQUE: Multidetector CT imaging of the abdomen and pelvis was performed following the standard protocol without IV contrast. COMPARISON:  Most recent CT fifteen days ago 01/01/2020. FINDINGS: Lower chest: Trace right pleural effusion/thickening. Rounded density in the lower most right lung base is new from prior exam and likely atelectasis, series 6, image 15. Hepatobiliary: Cirrhotic hepatic morphology with nodular contours. No obvious focal lesion on noncontrast exam. Small gallstones within the gallbladder. No biliary dilatation. Pancreas: No ductal dilatation or inflammation. Spleen: Splenomegaly with spleen measuring 14 x 6.8 x 14.6 cm (volume = 730 cm^3). Findings are grossly stable allowing for differences in caliper placement. Adrenals/Urinary Tract: Minimal thickening of the right adrenal gland without dominant nodule. No hydronephrosis. No renal or ureteral calculi. Urinary bladder is physiologically distended. Stomach/Bowel: Stomach is unremarkable. Administered enteric contrast  reaches the mid distal small bowel. Mild wall thickening of the duodenum and proximal small bowel loops. No obstruction. Portions of normal appendix are visualized, partially obscured by adjacent ascites. There is mottled air in the ascending colon suspicious for pneumatosis, with a few adjacent foci of extraluminal air, for example series 2, image 48 and 47. Small focus of air centrally may be within the mesenteric veins versus mesenteric fat, series 2, image 46. there is no definite associated colonic wall thickening or pericolonic edema. Formed stool throughout the more distal colon with scattered diverticula. No focal  diverticulitis. Vascular/Lymphatic: Aortic atherosclerosis. Perisplenic collaterals with splenorenal shunting were better appreciated on prior contrast-enhanced exam. Tiny focus of air in the right mesentery which may be intravascular versus in the mesenteric fat, series 2, image 46. possible additional tiny mesenteric venous focus in the central abdomen, series 2, image 37. Prominent periportal lymph nodes which are likely reactive. Reproductive: Stable prostate gland. Other: Small volume abdominopelvic ascites, greatest volume in the right upper and lower quadrants. Tiny foci of free air adjacent to the ascending colon in the right abdomen. No evidence of intra-abdominal abscess. There is a small fat containing umbilical hernia. Musculoskeletal: Degenerative disc disease and facet hypertrophy of the lumbar spine. There are no acute or suspicious osseous abnormalities. IMPRESSION: 1. Findings suspicious for pneumatosis in the ascending colon, with a few adjacent foci of extraluminal free air. Small amount of mesenteric venous gas is suspected in the region of pneumatosis. However, there is no significant colonic wall thickening or inflammatory change in the region of pneumatosis and extraluminal air. This may be benign pneumatosis, and free air in the absence of bowel perforation has been described, however this is indeterminate on imaging. Close clinical follow-up is recommended. 2. Mild wall thickening of the duodenum and proximal small bowel loops, likely reactive. 3. Cirrhosis with portal hypertension, splenomegaly, and small volume abdominopelvic ascites. 4. Cholelithiasis. Aortic Atherosclerosis (ICD10-I70.0). These results were called by telephone at the time of interpretation on 01/16/2020 at 5:34 pm to Dr Darliss Cheney , who verbally acknowledged these results. Electronically Signed   By: Keith Rake M.D.   On: 01/16/2020 17:37   DG Chest 2 View  Result Date: 01/08/2020 CLINICAL DATA:  Chest pain.   Pleural effusion. EXAM: CHEST - 2 VIEW COMPARISON:  05/23/2017; CT abdomen pelvis-01/01/2020 FINDINGS: Grossly unchanged borderline enlarged cardiac silhouette and mediastinal contours with mild tortuosity and potential ectasia of the thoracic aorta. Grossly unchanged minimal left basilar/retrocardiac heterogeneous opacities favored to represent atelectasis. No new focal airspace opacities. No pleural effusion or pneumothorax. No evidence of edema. No acute osseous abnormalities. Stigmata of dish within the thoracic spine. IMPRESSION: Similar findings of borderline cardiomegaly and left basilar atelectasis/scar without superimposed acute cardiopulmonary disease. Specifically, no evidence of pleural effusion. Electronically Signed   By: Sandi Mariscal M.D.   On: 01/08/2020 17:12   CT ABDOMEN PELVIS W CONTRAST  Result Date: 01/01/2020 CLINICAL DATA:  Cirrhosis, weight gain, abdominal distension, leg swelling, jaundice, history GERD, hypertension EXAM: CT ABDOMEN AND PELVIS WITH CONTRAST TECHNIQUE: Multidetector CT imaging of the abdomen and pelvis was performed using the standard protocol following bolus administration of intravenous contrast. Sagittal and coronal MPR images reconstructed from axial data set. CONTRAST:  154m OMNIPAQUE IOHEXOL 300 MG/ML SOLN IV. No oral contrast. COMPARISON:  04/05/2016 FINDINGS: Lower chest: Subsegmental atelectasis RIGHT lower lobe. Tiny RIGHT pleural effusion. Hepatobiliary: Dependent calculi within gallbladder. Gallbladder wall appears thickened, nonspecific in the setting of ascites. Heterogeneous cirrhotic appearing  liver without discrete mass. No intrahepatic or extrahepatic biliary dilatation. Pancreas: Normal appearance Spleen: Mildly enlarged, 14.6 x 13.6 x 5.3 cm (volume = 550 cm^3). No focal mass. Adrenals/Urinary Tract: Adrenal glands, kidneys, ureters, and bladder normal appearance Stomach/Bowel: Normal appendix. Stomach and bowel loops normal appearance  Vascular/Lymphatic: Aorta normal caliber with mild scattered atherosclerotic calcifications. Vascular structures patent. Large perisplenic collateral extending to LEFT renal vein consistent with spontaneous splenorenal shunt. Major vascular structures patent. No adenopathy. Reproductive: Unremarkable seminal vesicles. Prostate gland minimally enlarged at 4.8 x 3.5 cm image 96. Other: Moderate ascites. No free air. No hernia. Scattered edema/infiltration of tissue planes in abdomen. 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 Moderate ascites. Cholelithiasis. Minimal prostatic enlargement. Electronically Signed   By: Lavonia Dana M.D.   On: 01/01/2020 12:54   US Abdomen Limited  Result Date: 01/16/2020 CLINICAL DATA:  History of cirrhosis. Please perform ascites search ultrasound and ultrasound-guided paracentesis as indicated. EXAM: LIMITED ABDOMEN ULTRASOUND FOR ASCITES TECHNIQUE: Limited ultrasound survey for ascites was performed in all four abdominal quadrants. COMPARISON:  Ultrasound-guided paracentesis-01/08/2020 (yielding 1.2 L peritoneal fluid) FINDINGS: Sonographic evaluation of the abdomen demonstrates a trace amount intra-abdominal ascites within the bilateral lower abdominal quadrants, too small to allow for safe ultrasound-guided paracentesis. IMPRESSION: Trace amount of intra-abdominal ascites, too small to allow for safe ultrasound-guided paracentesis. No paracentesis attempted. Electronically Signed   By: Sandi Mariscal M.D.   On: 01/16/2020 16:55   US Paracentesis  Result Date: 01/08/2020 INDICATION: Alcoholic hepatitis and decompensated cirrhosis. Recurrent ascites. Request for therapeutic paracentesis. EXAM: ULTRASOUND GUIDED RIGHT LOWER QUADRANT PARACENTESIS MEDICATIONS: None. COMPLICATIONS: None immediate. PROCEDURE: Informed written consent was obtained from the patient after a discussion of the risks, benefits and  alternatives to treatment. A timeout was performed prior to the initiation of the procedure. Initial ultrasound scanning demonstrates a small to moderate amount of ascites within the right lower abdominal quadrant. The right lower abdomen was prepped and draped in the usual sterile fashion. 1% lidocaine was used for local anesthesia. Following this, a 19 gauge, 10-cm, Yueh catheter was introduced. An ultrasound image was saved for documentation purposes. The paracentesis was performed. The catheter was removed and a dressing was applied. The patient tolerated the procedure well without immediate post procedural complication. FINDINGS: A total of approximately 1.2 L of clear, bright yellow fluid was removed. IMPRESSION: Successful ultrasound-guided paracentesis yielding 1.2 liters of peritoneal fluid. Read by: Ascencion Dike PA-C Electronically Signed   By: Sandi Mariscal M.D.   On: 01/08/2020 16:11   US Paracentesis  Result Date: 01/02/2020 INDICATION: Patient with history of acute alcoholic hepatitis/chronic alcoholic cirrhosis, ascites; request received for diagnostic and therapeutic paracentesis. EXAM: ULTRASOUND GUIDED DIAGNOSTIC AND THERAPEUTIC PARACENTESIS MEDICATIONS: None COMPLICATIONS: None immediate. PROCEDURE: Informed written consent was obtained from the patient after a discussion of the risks, benefits and alternatives to treatment. A timeout was performed prior to the initiation of the procedure. Initial ultrasound scanning demonstrates a small amount of ascites within the right lower abdominal quadrant. The right lower abdomen was prepped and draped in the usual sterile fashion. 1% lidocaine was used for local anesthesia. Following this, a 19 gauge, 10-cm, Yueh catheter was introduced. An ultrasound image was saved for documentation purposes. The paracentesis was performed. The catheter was removed and a dressing was applied. The patient tolerated the procedure well without immediate post procedural  complication. FINDINGS: A total of approximately 1.4 liters of clear, golden yellow fluid was removed. Samples were  sent to the laboratory as requested by the clinical team. IMPRESSION: Successful ultrasound-guided diagnostic and therapeutic paracentesis yielding 1.4 liters of peritoneal fluid. Read by: Rowe Robert, PA-C Electronically Signed   By: Corrie Mckusick D.O.   On: 01/02/2020 12:44   DG Chest Port 1 View  Result Date: 01/16/2020 CLINICAL DATA:  Weak. Additional provided: Patient reports weakness, abdominal distension that is more than usual, intermittent dizziness when standing, history of liver failure. EXAM: PORTABLE CHEST 1 VIEW COMPARISON:  Prior chest radiograph 01/08/2020 FINDINGS: Borderline cardiomegaly, unchanged. No appreciable airspace consolidation within the lungs. Redemonstrated mild scarring and/or atelectasis within the left lung base. No evidence of pleural effusion or pneumothorax. No acute bony abnormality identified. IMPRESSION: No evidence of acute cardiopulmonary abnormality. Borderline cardiomegaly, unchanged. Redemonstrated mild scarring and/or atelectasis within the left lung base. Electronically Signed   By: Kellie Simmering DO   On: 01/16/2020 13:27   ECHOCARDIOGRAM COMPLETE  Result Date: 01/02/2020    ECHOCARDIOGRAM REPORT   Patient Name:   ALICE VITELLI Date of Exam: 01/02/2020 Medical Rec #:  [id number]       Height:       65.0 in Accession #:    [id number]      Weight:       254.1 lb Date of Birth:  Dec 15, 1965      BSA:          2.190 m Patient Age:    35 years        BP:           111/67 mmHg Patient Gender: M               HR:           93 bpm. Exam Location:  Inpatient Procedure: 2D Echo, Cardiac Doppler and Color Doppler Indications:    R94.31 Abnormal EKG  History:        Patient has no prior history of Echocardiogram examinations.                 Risk Factors:Hypertension. ETOH. Edema. Ascites.  Sonographer:    Roseanna Rainbow RDCS Referring Phys: [id number] [place] Comments: Suboptimal parasternal window, no subcostal window, Technically difficult study due to poor echo windows and patient is morbidly obese. Image acquisition challenging due to patient body habitus. Study is to rule out right heart failure. IMPRESSIONS  1. Left ventricular ejection fraction, by estimation, is 70 to 75%. The left ventricle has hyperdynamic function. The left ventricle has no regional wall motion abnormalities. There is moderate left ventricular hypertrophy. Left ventricular diastolic parameters are consistent with Grade I diastolic dysfunction (impaired relaxation).  2. Right ventricular systolic function is normal. The right ventricular size is normal.  3. The mitral valve is normal in structure. No evidence of mitral valve regurgitation. No evidence of mitral stenosis.  4. The aortic valve was not well visualized. Aortic valve regurgitation is not visualized. No aortic stenosis is present.  5. The inferior vena cava is normal in size with greater than 50% respiratory variability, suggesting right atrial pressure of 3 mmHg. FINDINGS  Left Ventricle: Left ventricular ejection fraction, by estimation, is 70 to 75%. The left ventricle has hyperdynamic function. The left ventricle has no regional wall motion abnormalities. The left ventricular internal cavity size was normal in size. There is moderate left ventricular hypertrophy. Left ventricular diastolic parameters are consistent with Grade I diastolic dysfunction (impaired relaxation). Right Ventricle: The right ventricular size is normal. No  increase in right ventricular wall thickness. Right ventricular systolic function is normal. Left Atrium: Left atrial size was normal in size. Right Atrium: Right atrial size was normal in size. Pericardium: There is no evidence of pericardial effusion. Mitral Valve: The mitral valve is normal in structure. Normal mobility of the mitral valve leaflets. No evidence of mitral  valve regurgitation. No evidence of mitral valve stenosis. Tricuspid Valve: The tricuspid valve is normal in structure. Tricuspid valve regurgitation is not demonstrated. No evidence of tricuspid stenosis. Aortic Valve: The aortic valve was not well visualized. Aortic valve regurgitation is not visualized. No aortic stenosis is present. Pulmonic Valve: The pulmonic valve was normal in structure. Pulmonic valve regurgitation is not visualized. No evidence of pulmonic stenosis. Aorta: The aortic root is normal in size and structure. Venous: The inferior vena cava is normal in size with greater than 50% respiratory variability, suggesting right atrial pressure of 3 mmHg. IAS/Shunts: No atrial level shunt detected by color flow Doppler.  LEFT VENTRICLE PLAX 2D LVIDd:         4.08 cm     Diastology LVIDs:         2.54 cm     LV e' lateral:   9.79 cm/s LV PW:         1.46 cm     LV E/e' lateral: 9.0 LV IVS:        1.33 cm     LV e' medial:    7.40 cm/s LVOT diam:     2.10 cm     LV E/e' medial:  11.9 LV SV:         80 LV SV Index:   37 LVOT Area:     3.46 cm  LV Volumes (MOD) LV vol d, MOD A2C: 48.6 ml LV vol d, MOD A4C: 68.5 ml LV vol s, MOD A2C: 11.8 ml LV vol s, MOD A4C: 17.0 ml LV SV MOD A2C:     36.8 ml LV SV MOD A4C:     68.5 ml LV SV MOD BP:      45.2 ml RIGHT VENTRICLE RV S prime:     13.40 cm/s TAPSE (M-mode): 2.0 cm LEFT ATRIUM             Index       RIGHT ATRIUM           Index LA diam:        3.70 cm 1.69 cm/m  RA Area:     12.50 cm LA Vol (A2C):   31.2 ml 14.25 ml/m RA Volume:   20.40 ml  9.32 ml/m LA Vol (A4C):   46.5 ml 21.23 ml/m LA Biplane Vol: 40.8 ml 18.63 ml/m  AORTIC VALVE LVOT Vmax:   134.00 cm/s LVOT Vmean:  96.000 cm/s LVOT VTI:    0.231 m  AORTA Ao Root diam: 3.30 cm Ao Asc diam:  3.20 cm MITRAL VALVE MV Area (PHT): 3.31 cm     SHUNTS MV Decel Time: 229 msec     Systemic VTI:  0.23 m MV E velocity: 87.80 cm/s   Systemic Diam: 2.10 cm MV A velocity: 123.00 cm/s MV E/A ratio:  0.71 Candee Furbish MD Electronically signed by Candee Furbish MD Signature Date/Time: 01/02/2020/11:01:13 AM    Final     Lab Data:  CBC: Recent Labs  Lab 01/16/20 1226 01/16/20 1226 01/17/20 0253 01/17/20 0908 01/17/20 1646 01/18/20 0041 01/18/20 1648 01/19/20 0203  WBC 17.0*  --  10.7*  --   --  7.7  --  7.9  NEUTROABS 15.0*  --   --   --   --   --   --  6.3  HGB 11.7*   < > 7.9* 7.9* 7.8* 7.8*  --  8.0*  HCT 34.7*   < > 23.9* 23.4* 23.6* 23.6*  --  24.1*  MCV 111.6*  --  114.4*  --   --  115.1*  --  115.9*  PLT 37*  --  21*  --   --  16* 16* 23*   < > = values in this interval not displayed.   Basic Metabolic Panel: Recent Labs  Lab 01/16/20 1226 01/17/20 0253 01/18/20 0041 01/18/20 0505 01/19/20 0203  NA 131* 130* 134* 135 135  K 4.8 4.5 4.0 4.2 5.6*  CL 91* 96* 101 102 104  CO2 _0 GLUCOSE 97 119* 120* 102* 120*  BUN 45* 41* 36* 33* 30*  CREATININE 1.37* 0.77 0.71 0.60* <0.30*  CALCIUM 8.4* 7.8* 8.2* 8.6* 8.7*  MG 1.7  --   --   --   --    GFR: CrCl cannot be calculated (This lab value cannot be used to calculate CrCl because it is not a number: <0.30). Liver Function Tests: Recent Labs  Lab 01/16/20 1226 01/17/20 0253 01/18/20 0041 01/18/20 0505 01/19/20 0203  AST 209* 168* 111* 103* 136*  ALT 232* 173* 129* 119* 119*  ALKPHOS 228* 104 116 108 96  BILITOT 36.0* 29.5* 25.0* 24.6* 25.7*  PROT 6.5 5.9* 6.0* 6.2* 5.8*  ALBUMIN 2.3* 3.1* 3.8 4.0 3.3*   No results for input(s): LIPASE, AMYLASE in the last 168 hours. No results for input(s): AMMONIA in the last 168 hours. Coagulation Profile: Recent Labs  Lab 01/16/20 1226 01/17/20 0253 01/18/20 0840 01/18/20 1648 01/19/20 0203  INR 2.6* 3.5* 3.7* 3.6* 3.3*   Cardiac Enzymes: No results for input(s): CKTOTAL, CKMB, CKMBINDEX, TROPONINI in the last 168 hours. BNP (last 3 results) No results for input(s): PROBNP in the last 8760 hours. HbA1C: No results for input(s): HGBA1C in the last 72  hours. CBG: Recent Labs  Lab 01/16/20 1959 01/16/20 2344  GLUCAP 108* 121*   Lipid Profile: No results for input(s): CHOL, HDL, LDLCALC, TRIG, CHOLHDL, LDLDIRECT in the last 72 hours. Thyroid Function Tests: No results for input(s): TSH, T4TOTAL, FREET4, T3FREE, THYROIDAB in the last 72 hours. Anemia Panel: No results for input(s): VITAMINB12, FOLATE, FERRITIN, TIBC, IRON, RETICCTPCT in the last 72 hours. Urine analysis:    Component Value Date/Time   COLORURINE AMBER (A) 01/16/2020 1245   APPEARANCEUR HAZY (A) 01/16/2020 1245   LABSPEC 1.013 01/16/2020 1245   PHURINE 6.0 01/16/2020 1245   GLUCOSEU NEGATIVE 01/16/2020 1245   HGBUR MODERATE (A) 01/16/2020 1245   BILIRUBINUR MODERATE (A) 01/16/2020 1245   BILIRUBINUR large (A) 03/19/2016 1103   BILIRUBINUR small 12/25/2014 1001   KETONESUR NEGATIVE 01/16/2020 1245   PROTEINUR NEGATIVE 01/16/2020 1245   UROBILINOGEN 4.0 03/19/2016 1103   UROBILINOGEN 0.2 01/21/2015 0623   NITRITE NEGATIVE 01/16/2020 1245   LEUKOCYTESUR NEGATIVE 01/16/2020 1245     Kimberlynn Lumbra M.D. [facility] 01/19/2020, 12:36 PM   Call night coverage person covering after 7pm

## 2020-01-19 NOTE — Progress Notes (Addendum)
[facility] Progress Note    Since last GI note: Had epistaxis yesterday and was given Plts. The nose bleeding stopped and his Plts this AM a bit higher.  Objective: Vital signs in last 24 hours: Temp:  [97.5 F (36.4 C)-99.2 F (37.3 C)] 98.1 F (36.7 C) (05/22 0525) Pulse Rate:  [85-97] 97 (05/22 0525) Resp:  [16-20] 18 (05/22 0525) BP: (112-148)/(64-83) 145/77 (05/22 0525) SpO2:  [95 %-98 %] 96 % (05/22 0525) Last BM Date: 01/19/20 General: alert and oriented times 3 Heart: regular rate and rythm Abdomen: soft, non-tender, non-distended, normal bowel sounds   Lab Results: Recent Labs    01/17/20 0253 01/17/20 0253 01/17/20 0908 01/17/20 1646 01/18/20 0041 01/18/20 1648 01/19/20 0203  WBC 10.7*  --   --   --  7.7  --  7.9  HGB 7.9*  --    < > 7.8* 7.8*  --  8.0*  PLT 21*   < >  --   --  16* 16* 23*  MCV 114.4*  --   --   --  115.1*  --  115.9*   < > = values in this interval not displayed.   Recent Labs    01/18/20 0041 01/18/20 0505 01/19/20 0203  NA 134* 135 135  K 4.0 4.2 5.6*  CL 101 102 104  CO2 25 25 22   GLUCOSE 120* 102* 120*  BUN 36* 33* 30*  CREATININE 0.71 0.60* <0.30*  CALCIUM 8.2* 8.6* 8.7*   Recent Labs    01/18/20 0041 01/18/20 0505 01/19/20 0203  PROT 6.0* 6.2* 5.8*  ALBUMIN 3.8 4.0 3.3*  AST 111* 103* 136*  ALT 129* 119* 119*  ALKPHOS 116 108 96  BILITOT 25.0* 24.6* 25.7*   Recent Labs    01/18/20 1648 01/19/20 0203  INR 3.6* 3.3*    Medications: Scheduled Meds: . Chlorhexidine Gluconate Cloth  6 each Topical Q0600  . docusate sodium  100 mg Oral BID  . feeding supplement (PRO-STAT SUGAR FREE 64)  30 mL Oral TID BM  . lactulose  20 g Oral TID  . mouth rinse  15 mL Mouth Rinse BID  . multivitamin with minerals  1 tablet Oral Daily  . pantoprazole  40 mg Oral BID  . prednisoLONE  10 mg Oral Daily  . rifaximin  550 mg Oral BID  . sodium chloride flush  3 mL Intravenous Q12H  . sodium zirconium  cyclosilicate  10 g Oral Once   Continuous Infusions: . cefTRIAXone (ROCEPHIN)  IV 2 g (01/19/20 0131)  . sodium chloride     PRN Meds:.ondansetron **OR** ondansetron (ZOFRAN) IV, traMADol   Assessment/Plan: 54 y.o. male with alcoholic hepatitis, decompensated cirrhosis, likely intraabdominal infection with polymicrobial GN blood culture.  Blood culture 5/19 + for e coli and enterobacter.  Flagyl is being added today for better coverage.  Urine was + for E coli. Colon compromise may contribute as well as it does appear abnormal on CT 5/19 (pneumatosis ascending with local extraluminal air).  He was on Steroids for severe alcoholic hepatitis and that is being rapidly weaned. He is getting prednisolone 10mg  today and tomorrow and then I think steroids should be stopped completely after that.  Tbili about the same, INR a bit improved.   I was in the room with his wife, daughter and Dr. Tana Coast. Obviously still not out of the woods by any means.  High risk of bleeding given INR 3s and very low plts.  Will give 2  more days of Vit K.   In paitent [facility] will return on Monday to see him however I am around all weekend for any questions, concerns.  thanks   Milus Banister, MD  01/19/2020, 9:12 AM [facility] Pager [phone]

## 2020-01-20 LAB — COMPREHENSIVE METABOLIC PANEL
ALT: 132 U/L — ABNORMAL HIGH (ref 0–44)
AST: 115 U/L — ABNORMAL HIGH (ref 15–41)
Albumin: 3.4 g/dL — ABNORMAL LOW (ref 3.5–5.0)
Alkaline Phosphatase: 106 U/L (ref 38–126)
Anion gap: 10 (ref 5–15)
BUN: 24 mg/dL — ABNORMAL HIGH (ref 6–20)
CO2: 21 mmol/L — ABNORMAL LOW (ref 22–32)
Calcium: 8.9 mg/dL (ref 8.9–10.3)
Chloride: 104 mmol/L (ref 98–111)
Creatinine, Ser: 0.33 mg/dL — ABNORMAL LOW (ref 0.61–1.24)
GFR calc Af Amer: >60 mL/min (ref >60)
GFR calc non Af Amer: >60 mL/min (ref >60)
Glucose, Bld: 104 mg/dL — ABNORMAL HIGH (ref 70–99)
Potassium: 4.6 mmol/L (ref 3.5–5.1)
Sodium: 135 mmol/L (ref 135–145)
Total Bilirubin: 32.7 mg/dL (ref 0.3–1.2)
Total Protein: 5.9 g/dL — ABNORMAL LOW (ref 6.5–8.1)

## 2020-01-20 LAB — PROTIME-INR
INR: 3.1 — ABNORMAL HIGH (ref 0.8–1.2)
Prothrombin Time: 31.2 seconds — ABNORMAL HIGH (ref 11.4–15.2)

## 2020-01-20 LAB — CBC
HCT: 26.6 % — ABNORMAL LOW (ref 39.0–52.0)
Hemoglobin: 8.5 g/dL — ABNORMAL LOW (ref 13.0–17.0)
MCH: 38.6 pg — ABNORMAL HIGH (ref 26.0–34.0)
MCHC: 32 g/dL (ref 30.0–36.0)
MCV: 120.9 fL — ABNORMAL HIGH (ref 80.0–100.0)
Platelets: 22 10*3/uL — CL (ref 150–400)
RBC: 2.2 MIL/uL — ABNORMAL LOW (ref 4.22–5.81)
RDW: 16.6 % — ABNORMAL HIGH (ref 11.5–15.5)
WBC: 9.8 10*3/uL (ref 4.0–10.5)
nRBC: 0 % (ref 0.0–0.2)

## 2020-01-20 LAB — AMMONIA: Ammonia: 76 umol/L — ABNORMAL HIGH (ref 9–35)

## 2020-01-20 MED ORDER — SODIUM CHLORIDE 0.9 % IV SOLN
INTRAVENOUS | Status: DC | PRN
  Administered 2020-01-20: 250 mL via INTRAVENOUS

## 2020-01-20 NOTE — Progress Notes (Signed)
CRITICAL VALUE ALERT  Critical Value:  Total Bilirubin 32.7  Date & Time Notied:  01/20/20, [id number]  Provider Notified: MD Rai  Orders Received/Actions taken: No new orders.

## 2020-01-20 NOTE — Progress Notes (Signed)
Called to room by pt's primary RN, Ria Comment, to discuss with the patient and his sister the current hospital visitor policy.  Per RN to RN shift report this had been discussed with the patient on previous shift. Patient and sister are requesting that nursing allow exception to visitor policy due to patient's "declining" health.  At this time, patient is up to chair and able to communicate with RN upon assessment. Vital signs remain stable. Writer offered to the patient and sister use of the iPad to communicate via Facetime with additional family members.  At this time patient has designated 2 visitors of his choice to have visit at the bedside and those names are listed on the chart. Both patient and sister verbalize understanding of the visitation policy. Dark, Laurel Dimmer, RN

## 2020-01-20 NOTE — Consult Note (Signed)
Consultation Note Date: 01/20/2020   Patient Name: Jesse Esparza  DOB: 01/29/1966  MRN: [id number]  Age / Sex: 54 y.o., male  PCP: Charlott Rakes, MD Referring Physician: Mendel Corning, MD  Reason for Consultation: Establishing goals of care  HPI/Patient Profile: 54 y.o. male  with past medical history of alcoholic cirrhosis, GERD, hyperlipidemia, hypertension, obesity with recent hospitalization for acute alcoholic hepatitis who underwent paracentesis with 1.4 L of yellow ascitic fluid on 5/5.  He had EGD on 5/14 and was discharged on daily Protonix.  He called GI office and reported abdominal pain while feeling weak and dizzy and presented to the hospital.  He was admitted on 01/16/2020 with sepsis with E. coli bacteremia from UTI as well as benign pneumatosis of the ascending colon, decompensated hepatitis, chronic thrombocytopenia with epistaxis requiring transfusion and acute kidney injury with improvement following medical intervention.  Palliative consulted for goals of care.  Clinical Assessment and Goals of Care: Care consult received.  Chart reviewed including personal review of pertinent labs and imaging.  I met today with Jesse Esparza.  He is a pleasant 54 year old gentleman who reports he is doing, "okay."  States he is having trouble sleeping as he sits and thinks about his future and fear associated with this.  He tells me the most important thing to him is his family, including his 2 children.  One of his daughters lives locally and the other is traveling for the wheezing and to visit with him.  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    When asked about his understanding of his situation he replied that it is, "not good."  States that he understands he has either looking for miracle or going to die soon.  We talked about his clinical course this admission and continued concern about  changes we are seeing in his organ systems in conjunction with the continued decline he reports in his nutrition and functional status.  He readily offers that he has been considering the future and the fact that he may die and states that this is the reason that he changed CODE STATUS to DNR/DNI.  He reports being realistic about the future but still hopeful for some improvement and stabilization.  Discussed plan to continue to see how he responds to current interventions through the weekend.  Palliative to continue to follow and progress conversation based upon this clinical course.  SUMMARY OF RECOMMENDATIONS   -DNR/DNI -Jesse Esparza understands the severity of his situation.  Plan to continue with current interventions to see how he continues to do with supportive care. -He is at high risk of continued decompensation sensation and death and he understands this fact.  We will continue to follow and progress conversation based upon his clinical over the next several days.  Code Status/Advance Care Planning:  DNR  Palliative Prophylaxis:   Bowel Regimen and Frequent Pain Assessment Psycho-social/Spiritual:   Desire for further Chaplaincy support:no  Additional Recommendations: Caregiving  Support/Resources  Prognosis:   Guarded  Discharge Planning:  To Be Determined      Primary Diagnoses: Present on Admission: . Severe sepsis ([place]) . Alcoholic cirrhosis of liver with ascites ([place]) . AKI (acute kidney injury) ([place]) . Cirrhosis ([place]) . Alcoholic hepatitis with ascites . Pneumatosis intestinalis . E coli bacteremia   I have reviewed the medical record, interviewed the patient and family, and examined the patient. The following aspects are pertinent.  Past Medical History:  Diagnosis Date  . Cirrhosis ([place])   . Depression   . Elevated liver function tests   . Gallbladder sludge   . GERD (gastroesophageal reflux disease)   . Hypercholesteremia   . Hypertension   .  Obesity    Social History   Socioeconomic History  . Marital status: Divorced    Spouse name: Not on file  . Number of children: Not on file  . Years of education: Not on file  . Highest education level: Not on file  Occupational History  . Not on file  Tobacco Use  . Smoking status: Never Smoker  . Smokeless tobacco: Never Used  Substance and Sexual Activity  . Alcohol use: Yes    Alcohol/week: 2.0 - 3.0 standard drinks    Types: 2 - 3 Shots of liquor per week    Comment: last drink 6-7 months ago  . Drug use: No  . Sexual activity: Not on file  Other Topics Concern  . Not on file  Social History Narrative  . Not on file   Social Determinants of Health   Financial Resource Strain:   . Difficulty of Paying Living Expenses:   Food Insecurity:   . Worried About Charity fundraiser in the Last Year:   . Arboriculturist in the Last Year:   Transportation Needs:   . Film/video editor (Medical):   Marland Kitchen Lack of Transportation (Non-Medical):   Physical Activity:   . Days of Exercise per Week:   . Minutes of Exercise per Session:   Stress:   . Feeling of Stress :   Social Connections:   . Frequency of Communication with Friends and Family:   . Frequency of Social Gatherings with Friends and Family:   . Attends Religious Services:   . Active Member of Clubs or Organizations:   . Attends Archivist Meetings:   Marland Kitchen Marital Status:    Family History  Problem Relation Age of Onset  . Hyperlipidemia Father   . Hyperlipidemia Mother   . Colon cancer Neg Hx   . Esophageal cancer Neg Hx   . Rectal cancer Neg Hx   . Stomach cancer Neg Hx    Scheduled Meds: . Chlorhexidine Gluconate Cloth  6 each Topical Q0600  . docusate sodium  100 mg Oral BID  . feeding supplement (PRO-STAT SUGAR FREE 64)  30 mL Oral TID BM  . furosemide  20 mg Oral Daily  . lactulose  20 g Oral TID  . mouth rinse  15 mL Mouth Rinse BID  . metroNIDAZOLE  500 mg Oral Q8H  . multivitamin with  minerals  1 tablet Oral Daily  . pantoprazole  40 mg Oral BID  . phytonadione  10 mg Oral Daily  . prednisoLONE  10 mg Oral Daily  . rifaximin  550 mg Oral BID  . sodium chloride flush  3 mL Intravenous Q12H   Continuous Infusions: . cefTRIAXone (ROCEPHIN)  IV 2 g (01/19/20 2105)  . sodium chloride     PRN Meds:.ondansetron **OR** ondansetron (ZOFRAN)  IV, traMADol Medications Prior to Admission:  Prior to Admission medications   Medication Sig Start Date End Date Taking? Authorizing Provider  furosemide (LASIX) 40 MG tablet Take 1 tablet (40 mg total) by mouth daily. 01/12/20 02/11/20 Yes Kyle, Tyrone A, DO  lactulose (CEPHULAC) 20 g packet Take 1 packet (20 g total) by mouth 2 (two) times daily. 01/11/20 02/10/20 Yes Kyle, Tyrone A, DO  pantoprazole (PROTONIX) 40 MG tablet Take 1 tablet (40 mg total) by mouth 2 (two) times daily before a meal. 01/11/20 02/10/20 Yes Kyle, Tyrone A, DO  prednisoLONE 5 MG TABS tablet Take 8 tablets (40 mg total) by mouth daily for 20 days. 01/11/20 01/31/20 Yes Kyle, Tyrone A, DO  rifaximin (XIFAXAN) 550 MG TABS tablet Take 1 tablet (550 mg total) by mouth 2 (two) times daily. 01/11/20 02/10/20 Yes Kyle, Tyrone A, DO  spironolactone (ALDACTONE) 100 MG tablet Take 1 tablet (100 mg total) by mouth daily. 01/12/20 02/11/20 Yes Kyle, Tyrone A, DO  thiamine 100 MG tablet Take 1 tablet (100 mg total) by mouth daily. 01/12/20 02/11/20 Yes Kyle, Tyrone A, DO   No Known Allergies Review of Systems  Constitutional: Positive for activity change, appetite change and fatigue.  HENT: Positive for nosebleeds.   Gastrointestinal: Positive for abdominal distention, abdominal pain, constipation and nausea.  Musculoskeletal: Positive for back pain.  Neurological: Positive for weakness.  Psychiatric/Behavioral: Positive for sleep disturbance.   Physical Exam  General: Alert, awake, in no acute distress.  HEENT: No bruits, no goiter, no JVD, scleral icterus Heart: Regular rate and  rhythm. No murmur appreciated. Lungs: Good air movement, clear Abdomen: Soft, nontender, distended, positive bowel sounds.  Ext: Anasarca lower extremity Skin: Warm and dry, jaundiced with some ecchymoses  Vital Signs: BP (!) 145/85 (BP Location: Right Arm)   Pulse (!) 104   Temp 99.6 F (37.6 C) (Oral)   Resp 18   Ht _0  (1.651 m)   Wt 113.1 kg   SpO2 98%   BMI 41.49 kg/m  Pain Scale: 0-10   Pain Score: Asleep   SpO2: SpO2: 98 % O2 Device:SpO2: 98 % O2 Flow Rate: .   IO: Intake/output summary:   Intake/Output Summary (Last 24 hours) at 01/20/2020 5035 Last data filed at 01/20/2020 0056 Gross per 24 hour  Intake 1060 ml  Output 1700 ml  Net -640 ml    LBM: Last BM Date: 01/19/20 Baseline Weight: Weight: 108.9 kg Most recent weight: Weight: 113.1 kg     Palliative Assessment/Data:   Flowsheet Rows     Most Recent Value  Intake Tab  Referral Department  Hospitalist  Unit at Time of Referral  Med/Surg Unit  Palliative Care Primary Diagnosis  Other (Comment) [Gastroenterology]  Date Notified  01/18/20  Palliative Care Type  New Palliative care  Reason for referral  Clarify Goals of Care  Date of Admission  01/16/20  Date first seen by Palliative Care  01/19/20  # of days Palliative referral response time  1 Day(s)  # of days IP prior to Palliative referral  2  Clinical Assessment  Palliative Performance Scale Score  50%  Psychosocial & Spiritual Assessment  Palliative Care Outcomes  Patient/Family meeting held?  Yes  Who was at the meeting?  Patient      Time In: 1920 Time Out: 2020 Time Total: 60 Greater than 50%  of this time was spent counseling and coordinating care related to the above assessment and plan.  Signed by: Micheline Rough,  MD   Please contact Palliative Medicine Team phone at [phone] for questions and concerns.  For individual provider: See Shea Evans

## 2020-01-20 NOTE — Progress Notes (Signed)
[facility]                                                                              Patient Demographics  Jesse Esparza, is a 54 y.o. male, DOB - 1965-12-19, CBU:[id number]  Admit date - 01/16/2020   Admitting Physician Darliss Cheney, MD  Outpatient Primary MD for the patient is Charlott Rakes, MD  Outpatient specialists:   LOS - 4  days   Medical records reviewed and are as summarized below:    Chief Complaint  Patient presents with  . Weakness  . Abdominal Distention       Brief summary   Admit date:01/16/2020 54 y.o.malewith alcoholic cirrhosis, GERD, hyperlipidemia, hypertension, obesity who was recently hospitalized from 5/4/-01/11/2020 for acute alcoholic hepatitis, underwent paracentesis on 5/5 with drainage of 1.4 L of yellow ascitic fluid,  also had EGD on 5/14 subsequently discharged on twice daily Protonix; called GI office on 5/19 c/o crampy RLQ abdominal pain 4/10, feeling very weak, dizzy with some subjective fever and chills.He was advised to come to the emergency department.  ED Course: Afebrile,tachycardic, tachypneic and hypotensive with blood pressure of 89/61. He was given a liter of IV fluid with not much improvement in his blood pressure. 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 Elevated lactic acid. Elevated creatinine of 1.37. Worsened hyperbilirubinemia as well as LFTs. Abdominal x-ray was done which was unremarkable.  Hospital course:Patient underwent CT abd showing benign pneumatosis in the ascending colon, with a few adjacent foci of extraluminal free air, suspected small amount of mesenteric venous gas  and free air in the absence of bowel perforation. Cholelithiasis,Cirrhosis with portal hypertension, splenomegaly, and small volume abdominopelvic ascites not enough to tap per IR (initially requested  diagnostic tap to r/o peritonitis).    Assessment & Plan    Principal Problem:  Severe sepsis, E. coli bacteremia, E. coli UTI -Patient met sepsis criteria at the time of admission based on leukocytosis, tachycardia, tachypnea, lactic acidosis. -CT abdomen on admission showed benign pneumatosis in the ascending colon with few adjacent foci of extraluminal free air, cirrhosis with portal hypertension -Patient was placed on empiric IV Rocephin for subacute bacterial peritonitis on admission -Blood cultures, urine cultures positive for E. coli, sensitive to IV Rocephin - GI and ID following closely, continue IV Rocephin and oral Flagyl was added on 5/22 -Can give IV albumin for hypotension as needed  Chronic alcoholic hepatitis with cirrhosis, decompensated, intra-abdominal infection -Recent admission with alcoholic hepatitis, DM, continues to worsen, total bili 32.7 (<--25.7 on 5/22),  -Patient was placed on home dose of prednisolone, lactulose, rifaximin. Completing prednisolone today. Avoiding steroids due to #1, discussed with GI on 5/22 -Continue oral metronidazole, IV Rocephin -Continue oral Lasix 20 mg daily -Overall poor prognosis, palliative care following -Sister at bedside, reports intermittent confusion, hallucination, obtain ammonia level. 1 BM this morning, continue lactulose 20 g 3 times daily, on rifaximin  Chronic thrombocytopenia, acute on chronic microcytic anemia, chronic GI blood loss -Recent EGD previous admission on 5/14-portal hypertensive gastropathy, grade 1 varices, no active bleeding No  further epistaxis, platelets 22K, received 1 unit platelets pheresis on 5/21 -Continue vitamin K 10 mg p.o. daily for 3 days   Acute kidney injury -Creatinine 1.3 at the time of admission was 1.0 at the time of discharge on 5/14 -Creatinine currently improved, starting on low-dose oral Lasix, follow creatinine function  Hyperkalemia -Resolved  Leukocytosis -Secondary  to sepsis versus chronic prednisone use, resolved  Dizziness -Patient has been complaining of dizziness since the previous admission.  States he feels debilitated and unable to walk due to dizziness and acute medical issues. -2D echo 01/02/2020 had shown EF 70 to 75% with grade 1 diastolic dysfunction, no aortic stenosis - No focal neurological deficits, no vertigo  Code Status: Full code DVT Prophylaxis: SCDs Family Communication: Discussed all imaging results, lab results, explained to the patient and patient's sister and daughter at the bedside   Disposition Plan:     Status is: Inpatient  Remains inpatient appropriate because:Inpatient level of care appropriate due to severity of illness   Dispo: The patient is from: Home              Anticipated d/c is to: Home              Anticipated d/c date is: > 3 days              Patient currently is not medically stable to d/c.       Time Spent in minutes 35 minutes  Procedures:  CT abdomen pelvis Consultants:   Gastroenterology  Antimicrobials:   Anti-infectives (From admission, onward)   Start     Dose/Rate Route Frequency Ordered Stop   01/19/20 1400  metroNIDAZOLE (FLAGYL) tablet 500 mg     500 mg Oral Every 8 hours 01/19/20 0930     01/17/20 2200  cefTRIAXone (ROCEPHIN) 2 g in sodium chloride 0.9 % 100 mL IVPB     2 g 200 mL/hr over 30 Minutes Intravenous Every 24 hours 01/16/20 2051     01/16/20 2200  ceFEPIme (MAXIPIME) 2 g in sodium chloride 0.9 % 100 mL IVPB  Status:  Discontinued     2 g 200 mL/hr over 30 Minutes Intravenous Every 8 hours 01/16/20 1451 01/16/20 1709   01/16/20 2200  rifaximin (XIFAXAN) tablet 550 mg     550 mg Oral 2 times daily 01/16/20 1455     01/16/20 1800  cefTRIAXone (ROCEPHIN) 2 g in sodium chloride 0.9 % 100 mL IVPB  Status:  Discontinued     2 g 200 mL/hr over 30 Minutes Intravenous Every 24 hours 01/16/20 1709 01/16/20 2051   01/16/20 1300  ceFEPIme (MAXIPIME) 2 g in sodium chloride  0.9 % 100 mL IVPB     2 g 200 mL/hr over 30 Minutes Intravenous  Once 01/16/20 1249 01/16/20 1348         Medications  Scheduled Meds: . Chlorhexidine Gluconate Cloth  6 each Topical Q0600  . docusate sodium  100 mg Oral BID  . feeding supplement (PRO-STAT SUGAR FREE 64)  30 mL Oral TID BM  . furosemide  20 mg Oral Daily  . lactulose  20 g Oral TID  . mouth rinse  15 mL Mouth Rinse BID  . metroNIDAZOLE  500 mg Oral Q8H  . multivitamin with minerals  1 tablet Oral Daily  . pantoprazole  40 mg Oral BID  . phytonadione  10 mg Oral Daily  . rifaximin  550 mg Oral BID  . sodium chloride flush  3  mL Intravenous Q12H   Continuous Infusions: . cefTRIAXone (ROCEPHIN)  IV 2 g (01/19/20 2105)  . sodium chloride     PRN Meds:.ondansetron **OR** ondansetron (ZOFRAN) IV, traMADol      Subjective:   Jesse Esparza was seen and examined today. Feels very weak, deconditioned and dizzy. Sister at the bedside reports intermittent confusion, hallucination. No epistaxis. Significant ascites, distended, 3+ pitting edema and lower extremity. No fevers.  Objective:   Vitals:   01/19/20 0525 01/19/20 1335 01/19/20 2042 01/20/20 0543  BP: (!) 145/77 136/81 133/74 (!) 145/85  Pulse: 97 (!) 104 (!) 102 (!) 104  Resp: '18 20 18 18  '$ Temp: 98.1 F (36.7 C) 97.6 F (36.4 C) 99 F (37.2 C) 99.6 F (37.6 C)  TempSrc: Oral Oral Oral Oral  SpO2: 96% 95% 98% 98%  Weight:    113.1 kg  Height:        Intake/Output Summary (Last 24 hours) at 01/20/2020 1229 Last data filed at 01/20/2020 0900 Gross per 24 hour  Intake 840 ml  Output 2350 ml  Net -1510 ml     Wt Readings from Last 3 Encounters:  01/20/20 113.1 kg  01/11/20 115 kg  11/07/17 99.3 kg   Physical Exam  General: Alert and oriented x 3, NAD, scleral icterus  Cardiovascular: S1 S2 clear, RRR. No pedal edema b/l  Respiratory: CTAB, no wheezing, rales or rhonchi  Gastrointestinal: Distended, ascites,  Ext: 3+  pedal edema  bilaterally  Neuro: no new deficits  Musculoskeletal: No cyanosis, clubbing  Skin: No rashes  Psych: Normal affect and demeanor, alert and oriented x3   Data Reviewed:  I have personally reviewed following labs and imaging studies  Micro Results Recent Results (from the past 240 hour(s))  Blood Culture (routine x 2)     Status: Abnormal   Collection Time: 01/16/20 12:40 PM   Specimen: BLOOD  Result Value Ref Range Status   Specimen Description   Final    BLOOD SITE NOT SPECIFIED Performed at [facility], [address], [place], [place] [address]    Special Requests   Final    BOTTLES DRAWN AEROBIC AND ANAEROBIC Blood Culture adequate volume Performed at [facility] [address], [place], [place] [address]    Culture  Setup Time   Final    IN BOTH AEROBIC AND ANAEROBIC BOTTLES GRAM NEGATIVE RODS CRITICAL RESULT CALLED TO, READ BACK BY AND VERIFIED WITH: Lavell Luster [facility] 01/17/20 0455 JDW Performed at [facility], [place] [address], [place], [place] [address]    Culture ESCHERICHIA COLI (A)  Final   Report Status 01/19/2020 FINAL  Final   Organism ID, Bacteria ESCHERICHIA COLI  Final      Susceptibility   Escherichia coli - MIC*    AMPICILLIN >=32 RESISTANT Resistant     CEFAZOLIN <=4 SENSITIVE Sensitive     CEFEPIME <=1 SENSITIVE Sensitive     CEFTAZIDIME <=1 SENSITIVE Sensitive     CEFTRIAXONE <=1 SENSITIVE Sensitive     CIPROFLOXACIN <=0.25 SENSITIVE Sensitive     GENTAMICIN >=16 RESISTANT Resistant     IMIPENEM <=0.25 SENSITIVE Sensitive     TRIMETH/SULFA >=320 RESISTANT Resistant     AMPICILLIN/SULBACTAM 16 INTERMEDIATE Intermediate     PIP/TAZO <=4 SENSITIVE Sensitive     * ESCHERICHIA COLI  Blood Culture ID Panel (Reflexed)     Status: Abnormal   Collection Time: 01/16/20 12:40 PM  Result Value Ref Range Status  Enterococcus species NOT DETECTED NOT DETECTED Final   Listeria monocytogenes NOT DETECTED NOT DETECTED Final    Staphylococcus species NOT DETECTED NOT DETECTED Final   Staphylococcus aureus (BCID) NOT DETECTED NOT DETECTED Final   Streptococcus species NOT DETECTED NOT DETECTED Final   Streptococcus agalactiae NOT DETECTED NOT DETECTED Final   Streptococcus pneumoniae NOT DETECTED NOT DETECTED Final   Streptococcus pyogenes NOT DETECTED NOT DETECTED Final   Acinetobacter baumannii NOT DETECTED NOT DETECTED Final   Enterobacteriaceae species DETECTED (A) NOT DETECTED Final    Comment: Enterobacteriaceae represent a large family of gram-negative bacteria, not a single organism. CRITICAL RESULT CALLED TO, READ BACK BY AND VERIFIED WITH: J GRIMSLEY PHARMD 01/17/20 0455 JDW    Enterobacter cloacae complex NOT DETECTED NOT DETECTED Final   Escherichia coli DETECTED (A) NOT DETECTED Final    Comment: CRITICAL RESULT CALLED TO, READ BACK BY AND VERIFIED WITH: J GRIMSLEY PHARMD 01/17/20 0455 JDW`    Klebsiella oxytoca NOT DETECTED NOT DETECTED Final   Klebsiella pneumoniae NOT DETECTED NOT DETECTED Final   Proteus species NOT DETECTED NOT DETECTED Final   Serratia marcescens NOT DETECTED NOT DETECTED Final   Carbapenem resistance NOT DETECTED NOT DETECTED Final   Haemophilus influenzae NOT DETECTED NOT DETECTED Final   Neisseria meningitidis NOT DETECTED NOT DETECTED Final   Pseudomonas aeruginosa NOT DETECTED NOT DETECTED Final   Candida albicans NOT DETECTED NOT DETECTED Final   Candida glabrata NOT DETECTED NOT DETECTED Final   Candida krusei NOT DETECTED NOT DETECTED Final   Candida parapsilosis NOT DETECTED NOT DETECTED Final   Candida tropicalis NOT DETECTED NOT DETECTED Final    Comment: Performed at [facility], [place] [address] [place], [place] [address]  Urine culture     Status: Abnormal   Collection Time: 01/16/20 12:44 PM   Specimen: In/Out Cath Urine  Result Value Ref Range Status   Specimen Description   Final    IN/OUT CATH URINE Performed at [facility] [address] [place], [place] [address]    Special Requests   Final    NONE Performed at [facility], [place] [address] [place], [place] [address]    Culture (A)  Final    >=100,000 COLONIES/mL ESCHERICHIA COLI MULTI-DRUG RESISTANT ORGANISM    Report Status 01/19/2020 FINAL  Final   Organism ID, Bacteria ESCHERICHIA COLI (A)  Final      Susceptibility   Escherichia coli - MIC*    AMPICILLIN >=32 RESISTANT Resistant     CEFAZOLIN <=4 SENSITIVE Sensitive     CEFTRIAXONE <=1 SENSITIVE Sensitive     CIPROFLOXACIN <=0.25 SENSITIVE Sensitive     GENTAMICIN >=16 RESISTANT Resistant     IMIPENEM <=0.25 SENSITIVE Sensitive     NITROFURANTOIN <=16 SENSITIVE Sensitive     TRIMETH/SULFA >=320 RESISTANT Resistant     AMPICILLIN/SULBACTAM 16 INTERMEDIATE Intermediate     PIP/TAZO <=4 SENSITIVE Sensitive     * >=100,000 COLONIES/mL ESCHERICHIA COLI  SARS Coronavirus 2 by RT PCR (hospital order, performed in [facility] hospital lab) Nasopharyngeal Nasopharyngeal Swab     Status: None   Collection Time: 01/16/20 12:57 PM   Specimen: Nasopharyngeal Swab  Result Value Ref Range Status   SARS Coronavirus 2 NEGATIVE NEGATIVE Final    Comment: (NOTE) SARS-CoV-2 target nucleic acids are NOT DETECTED. The SARS-CoV-2 RNA is generally detectable in upper and lower respiratory specimens during the acute phase of infection. The lowest concentration of SARS-CoV-2 viral copies  this assay can detect is 250 copies / mL. A negative result does not preclude SARS-CoV-2 infection and should not be used as the sole basis for treatment or other patient management decisions.  A negative result may occur with improper specimen collection / handling, submission of specimen other than nasopharyngeal swab, presence of viral mutation(s) within the areas targeted by this assay, and inadequate number of viral copies (<250 copies / mL). A negative result must be combined with clinical observations,  patient history, and epidemiological information. Fact Sheet for Patients:   StrictlyIdeas.no Fact Sheet for Healthcare Providers: BankingDealers.co.za This test is not yet approved or cleared  by the [place] FDA and has been authorized for detection and/or diagnosis of SARS-CoV-2 by FDA under an Emergency Use Authorization (EUA).  This EUA will remain in effect (meaning this test can be used) for the duration of the COVID-19 declaration under Section 564(b)(1) of the Act, 21 U.S.C. section 360bbb-3(b)(1), unless the authorization is terminated or revoked sooner. Performed at [facility], [place] [address], [place], [place] [address]   MRSA PCR Screening     Status: None   Collection Time: 01/16/20  7:04 PM   Specimen: Nasal Mucosa; Nasopharyngeal  Result Value Ref Range Status   MRSA by PCR NEGATIVE NEGATIVE Final    Comment:        The GeneXpert MRSA Assay (FDA approved for NASAL specimens only), is one component of a comprehensive MRSA colonization surveillance program. It is not intended to diagnose MRSA infection nor to guide or monitor treatment for MRSA infections. Performed at [facility], [place] [address], [place], [place] [address]   Blood Culture (routine x 2)     Status: None (Preliminary result)   Collection Time: 01/16/20  7:51 PM   Specimen: BLOOD  Result Value Ref Range Status   Specimen Description   Final    BLOOD BLOOD RIGHT ARM Performed at [facility] [address] [place], [place] [address]    Special Requests   Final    BOTTLES DRAWN AEROBIC AND ANAEROBIC Blood Culture adequate volume Performed at [facility] [address], [place], [place] [address]    Culture   Final    NO GROWTH 3 DAYS Performed at [facility], [place] [address] [place],  [address]    Report Status PENDING  Incomplete    Radiology  Reports CT ABDOMEN PELVIS WO CONTRAST  Result Date: 01/16/2020 CLINICAL DATA:  Abdominal pain and distension. History of cirrhosis. EXAM: CT ABDOMEN AND PELVIS WITHOUT CONTRAST TECHNIQUE: Multidetector CT imaging of the abdomen and pelvis was performed following the standard protocol without IV contrast. COMPARISON:  Most recent CT fifteen days ago 01/01/2020. FINDINGS: Lower chest: Trace right pleural effusion/thickening. Rounded density in the lower most right lung base is new from prior exam and likely atelectasis, series 6, image 15. Hepatobiliary: Cirrhotic hepatic morphology with nodular contours. No obvious focal lesion on noncontrast exam. Small gallstones within the gallbladder. No biliary dilatation. Pancreas: No ductal dilatation or inflammation. Spleen: Splenomegaly with spleen measuring 14 x 6.8 x 14.6 cm (volume = 730 cm^3). Findings are grossly stable allowing for differences in caliper placement. Adrenals/Urinary Tract: Minimal thickening of the right adrenal gland without dominant nodule. No hydronephrosis. No renal or ureteral calculi. Urinary bladder is physiologically distended. Stomach/Bowel: Stomach is unremarkable. Administered enteric contrast reaches the mid distal small bowel. Mild wall thickening of the duodenum and proximal small bowel loops.  No obstruction. Portions of normal appendix are visualized, partially obscured by adjacent ascites. There is mottled air in the ascending colon suspicious for pneumatosis, with a few adjacent foci of extraluminal air, for example series 2, image 48 and 47. Small focus of air centrally may be within the mesenteric veins versus mesenteric fat, series 2, image 46. there is no definite associated colonic wall thickening or pericolonic edema. Formed stool throughout the more distal colon with scattered diverticula. No focal diverticulitis. Vascular/Lymphatic: Aortic atherosclerosis. Perisplenic collaterals with splenorenal shunting were better  appreciated on prior contrast-enhanced exam. Tiny focus of air in the right mesentery which may be intravascular versus in the mesenteric fat, series 2, image 46. possible additional tiny mesenteric venous focus in the central abdomen, series 2, image 37. Prominent periportal lymph nodes which are likely reactive. Reproductive: Stable prostate gland. Other: Small volume abdominopelvic ascites, greatest volume in the right upper and lower quadrants. Tiny foci of free air adjacent to the ascending colon in the right abdomen. No evidence of intra-abdominal abscess. There is a small fat containing umbilical hernia. Musculoskeletal: Degenerative disc disease and facet hypertrophy of the lumbar spine. There are no acute or suspicious osseous abnormalities. IMPRESSION: 1. Findings suspicious for pneumatosis in the ascending colon, with a few adjacent foci of extraluminal free air. Small amount of mesenteric venous gas is suspected in the region of pneumatosis. However, there is no significant colonic wall thickening or inflammatory change in the region of pneumatosis and extraluminal air. This may be benign pneumatosis, and free air in the absence of bowel perforation has been described, however this is indeterminate on imaging. Close clinical follow-up is recommended. 2. Mild wall thickening of the duodenum and proximal small bowel loops, likely reactive. 3. Cirrhosis with portal hypertension, splenomegaly, and small volume abdominopelvic ascites. 4. Cholelithiasis. Aortic Atherosclerosis (ICD10-I70.0). These results were called by telephone at the time of interpretation on 01/16/2020 at 5:34 pm to Dr Darliss Cheney , who verbally acknowledged these results. Electronically Signed   By: Keith Rake M.D.   On: 01/16/2020 17:37   DG Chest 2 View  Result Date: 01/08/2020 CLINICAL DATA:  Chest pain.  Pleural effusion. EXAM: CHEST - 2 VIEW COMPARISON:  05/23/2017; CT abdomen pelvis-01/01/2020 FINDINGS: Grossly unchanged  borderline enlarged cardiac silhouette and mediastinal contours with mild tortuosity and potential ectasia of the thoracic aorta. Grossly unchanged minimal left basilar/retrocardiac heterogeneous opacities favored to represent atelectasis. No new focal airspace opacities. No pleural effusion or pneumothorax. No evidence of edema. No acute osseous abnormalities. Stigmata of dish within the thoracic spine. IMPRESSION: Similar findings of borderline cardiomegaly and left basilar atelectasis/scar without superimposed acute cardiopulmonary disease. Specifically, no evidence of pleural effusion. Electronically Signed   By: Sandi Mariscal M.D.   On: 01/08/2020 17:12   CT ABDOMEN PELVIS W CONTRAST  Result Date: 01/01/2020 CLINICAL DATA:  Cirrhosis, weight gain, abdominal distension, leg swelling, jaundice, history GERD, hypertension EXAM: CT ABDOMEN AND PELVIS WITH CONTRAST TECHNIQUE: Multidetector CT imaging of the abdomen and pelvis was performed using the standard protocol following bolus administration of intravenous contrast. Sagittal and coronal MPR images reconstructed from axial data set. CONTRAST:  140m OMNIPAQUE IOHEXOL 300 MG/ML SOLN IV. No oral contrast. COMPARISON:  04/05/2016 FINDINGS: Lower chest: Subsegmental atelectasis RIGHT lower lobe. Tiny RIGHT pleural effusion. Hepatobiliary: Dependent calculi within gallbladder. Gallbladder wall appears thickened, nonspecific in the setting of ascites. Heterogeneous cirrhotic appearing liver without discrete mass. No intrahepatic or extrahepatic biliary dilatation. Pancreas: Normal appearance Spleen: Mildly enlarged, 14.6  x 13.6 x 5.3 cm (volume = 550 cm^3). No focal mass. Adrenals/Urinary Tract: Adrenal glands, kidneys, ureters, and bladder normal appearance Stomach/Bowel: Normal appendix. Stomach and bowel loops normal appearance Vascular/Lymphatic: Aorta normal caliber with mild scattered atherosclerotic calcifications. Vascular structures patent. Large  perisplenic collateral extending to LEFT renal vein consistent with spontaneous splenorenal shunt. Major vascular structures patent. No adenopathy. Reproductive: Unremarkable seminal vesicles. Prostate gland minimally enlarged at 4.8 x 3.5 cm image 96. Other: Moderate ascites. No free air. No hernia. Scattered edema/infiltration of tissue planes in abdomen. 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 Moderate ascites. Cholelithiasis. Minimal prostatic enlargement. Electronically Signed   By: Lavonia Dana M.D.   On: 01/01/2020 12:54   US Abdomen Limited  Result Date: 01/16/2020 CLINICAL DATA:  History of cirrhosis. Please perform ascites search ultrasound and ultrasound-guided paracentesis as indicated. EXAM: LIMITED ABDOMEN ULTRASOUND FOR ASCITES TECHNIQUE: Limited ultrasound survey for ascites was performed in all four abdominal quadrants. COMPARISON:  Ultrasound-guided paracentesis-01/08/2020 (yielding 1.2 L peritoneal fluid) FINDINGS: Sonographic evaluation of the abdomen demonstrates a trace amount intra-abdominal ascites within the bilateral lower abdominal quadrants, too small to allow for safe ultrasound-guided paracentesis. IMPRESSION: Trace amount of intra-abdominal ascites, too small to allow for safe ultrasound-guided paracentesis. No paracentesis attempted. Electronically Signed   By: Sandi Mariscal M.D.   On: 01/16/2020 16:55   US Paracentesis  Result Date: 01/08/2020 INDICATION: Alcoholic hepatitis and decompensated cirrhosis. Recurrent ascites. Request for therapeutic paracentesis. EXAM: ULTRASOUND GUIDED RIGHT LOWER QUADRANT PARACENTESIS MEDICATIONS: None. COMPLICATIONS: None immediate. PROCEDURE: Informed written consent was obtained from the patient after a discussion of the risks, benefits and alternatives to treatment. A timeout was performed prior to the initiation of the procedure. Initial ultrasound scanning demonstrates a  small to moderate amount of ascites within the right lower abdominal quadrant. The right lower abdomen was prepped and draped in the usual sterile fashion. 1% lidocaine was used for local anesthesia. Following this, a 19 gauge, 10-cm, Yueh catheter was introduced. An ultrasound image was saved for documentation purposes. The paracentesis was performed. The catheter was removed and a dressing was applied. The patient tolerated the procedure well without immediate post procedural complication. FINDINGS: A total of approximately 1.2 L of clear, bright yellow fluid was removed. IMPRESSION: Successful ultrasound-guided paracentesis yielding 1.2 liters of peritoneal fluid. Read by: Ascencion Dike PA-C Electronically Signed   By: Sandi Mariscal M.D.   On: 01/08/2020 16:11   US Paracentesis  Result Date: 01/02/2020 INDICATION: Patient with history of acute alcoholic hepatitis/chronic alcoholic cirrhosis, ascites; request received for diagnostic and therapeutic paracentesis. EXAM: ULTRASOUND GUIDED DIAGNOSTIC AND THERAPEUTIC PARACENTESIS MEDICATIONS: None COMPLICATIONS: None immediate. PROCEDURE: Informed written consent was obtained from the patient after a discussion of the risks, benefits and alternatives to treatment. A timeout was performed prior to the initiation of the procedure. Initial ultrasound scanning demonstrates a small amount of ascites within the right lower abdominal quadrant. The right lower abdomen was prepped and draped in the usual sterile fashion. 1% lidocaine was used for local anesthesia. Following this, a 19 gauge, 10-cm, Yueh catheter was introduced. An ultrasound image was saved for documentation purposes. The paracentesis was performed. The catheter was removed and a dressing was applied. The patient tolerated the procedure well without immediate post procedural complication. FINDINGS: A total of approximately 1.4 liters of clear, golden yellow fluid was removed. Samples were sent to the  laboratory as requested by the clinical team. IMPRESSION: Successful ultrasound-guided diagnostic and therapeutic paracentesis  yielding 1.4 liters of peritoneal fluid. Read by: Rowe Robert, PA-C Electronically Signed   By: Corrie Mckusick D.O.   On: 01/02/2020 12:44   DG Chest Port 1 View  Result Date: 01/16/2020 CLINICAL DATA:  Weak. Additional provided: Patient reports weakness, abdominal distension that is more than usual, intermittent dizziness when standing, history of liver failure. EXAM: PORTABLE CHEST 1 VIEW COMPARISON:  Prior chest radiograph 01/08/2020 FINDINGS: Borderline cardiomegaly, unchanged. No appreciable airspace consolidation within the lungs. Redemonstrated mild scarring and/or atelectasis within the left lung base. No evidence of pleural effusion or pneumothorax. No acute bony abnormality identified. IMPRESSION: No evidence of acute cardiopulmonary abnormality. Borderline cardiomegaly, unchanged. Redemonstrated mild scarring and/or atelectasis within the left lung base. Electronically Signed   By: Kellie Simmering DO   On: 01/16/2020 13:27   ECHOCARDIOGRAM COMPLETE  Result Date: 01/02/2020    ECHOCARDIOGRAM REPORT   Patient Name:   KHADIM LUNDBERG Date of Exam: 01/02/2020 Medical Rec #:  [id number]       Height:       65.0 in Accession #:    [id number]      Weight:       254.1 lb Date of Birth:  06-26-1966      BSA:          2.190 m Patient Age:    77 years        BP:           111/67 mmHg Patient Gender: M               HR:           93 bpm. Exam Location:  Inpatient Procedure: 2D Echo, Cardiac Doppler and Color Doppler Indications:    R94.31 Abnormal EKG  History:        Patient has no prior history of Echocardiogram examinations.                 Risk Factors:Hypertension. ETOH. Edema. Ascites.  Sonographer:    Roseanna Rainbow RDCS Referring Phys: [id number] [place] Comments: Suboptimal parasternal window, no subcostal window, Technically difficult study due to poor echo  windows and patient is morbidly obese. Image acquisition challenging due to patient body habitus. Study is to rule out right heart failure. IMPRESSIONS  1. Left ventricular ejection fraction, by estimation, is 70 to 75%. The left ventricle has hyperdynamic function. The left ventricle has no regional wall motion abnormalities. There is moderate left ventricular hypertrophy. Left ventricular diastolic parameters are consistent with Grade I diastolic dysfunction (impaired relaxation).  2. Right ventricular systolic function is normal. The right ventricular size is normal.  3. The mitral valve is normal in structure. No evidence of mitral valve regurgitation. No evidence of mitral stenosis.  4. The aortic valve was not well visualized. Aortic valve regurgitation is not visualized. No aortic stenosis is present.  5. The inferior vena cava is normal in size with greater than 50% respiratory variability, suggesting right atrial pressure of 3 mmHg. FINDINGS  Left Ventricle: Left ventricular ejection fraction, by estimation, is 70 to 75%. The left ventricle has hyperdynamic function. The left ventricle has no regional wall motion abnormalities. The left ventricular internal cavity size was normal in size. There is moderate left ventricular hypertrophy. Left ventricular diastolic parameters are consistent with Grade I diastolic dysfunction (impaired relaxation). Right Ventricle: The right ventricular size is normal. No increase in right ventricular wall thickness. Right ventricular systolic function is normal. Left Atrium: Left atrial size  was normal in size. Right Atrium: Right atrial size was normal in size. Pericardium: There is no evidence of pericardial effusion. Mitral Valve: The mitral valve is normal in structure. Normal mobility of the mitral valve leaflets. No evidence of mitral valve regurgitation. No evidence of mitral valve stenosis. Tricuspid Valve: The tricuspid valve is normal in structure. Tricuspid valve  regurgitation is not demonstrated. No evidence of tricuspid stenosis. Aortic Valve: The aortic valve was not well visualized. Aortic valve regurgitation is not visualized. No aortic stenosis is present. Pulmonic Valve: The pulmonic valve was normal in structure. Pulmonic valve regurgitation is not visualized. No evidence of pulmonic stenosis. Aorta: The aortic root is normal in size and structure. Venous: The inferior vena cava is normal in size with greater than 50% respiratory variability, suggesting right atrial pressure of 3 mmHg. IAS/Shunts: No atrial level shunt detected by color flow Doppler.  LEFT VENTRICLE PLAX 2D LVIDd:         4.08 cm     Diastology LVIDs:         2.54 cm     LV e' lateral:   9.79 cm/s LV PW:         1.46 cm     LV E/e' lateral: 9.0 LV IVS:        1.33 cm     LV e' medial:    7.40 cm/s LVOT diam:     2.10 cm     LV E/e' medial:  11.9 LV SV:         80 LV SV Index:   37 LVOT Area:     3.46 cm  LV Volumes (MOD) LV vol d, MOD A2C: 48.6 ml LV vol d, MOD A4C: 68.5 ml LV vol s, MOD A2C: 11.8 ml LV vol s, MOD A4C: 17.0 ml LV SV MOD A2C:     36.8 ml LV SV MOD A4C:     68.5 ml LV SV MOD BP:      45.2 ml RIGHT VENTRICLE RV S prime:     13.40 cm/s TAPSE (M-mode): 2.0 cm LEFT ATRIUM             Index       RIGHT ATRIUM           Index LA diam:        3.70 cm 1.69 cm/m  RA Area:     12.50 cm LA Vol (A2C):   31.2 ml 14.25 ml/m RA Volume:   20.40 ml  9.32 ml/m LA Vol (A4C):   46.5 ml 21.23 ml/m LA Biplane Vol: 40.8 ml 18.63 ml/m  AORTIC VALVE LVOT Vmax:   134.00 cm/s LVOT Vmean:  96.000 cm/s LVOT VTI:    0.231 m  AORTA Ao Root diam: 3.30 cm Ao Asc diam:  3.20 cm MITRAL VALVE MV Area (PHT): 3.31 cm     SHUNTS MV Decel Time: 229 msec     Systemic VTI:  0.23 m MV E velocity: 87.80 cm/s   Systemic Diam: 2.10 cm MV A velocity: 123.00 cm/s MV E/A ratio:  0.71 Candee Furbish MD Electronically signed by Candee Furbish MD Signature Date/Time: 01/02/2020/11:01:13 AM    Final     Lab Data:  CBC: Recent Labs    Lab 01/16/20 1226 01/16/20 1226 01/17/20 0253 01/17/20 3710 01/17/20 0908 01/17/20 1646 01/18/20 0041 01/18/20 1648 01/19/20 0203 01/20/20 0452  WBC 17.0*  --  10.7*  --   --   --  7.7  --  7.9 9.8  NEUTROABS 15.0*  --   --   --   --   --   --   --  6.3  --   HGB 11.7*   < > 7.9*   < > 7.9* 7.8* 7.8*  --  8.0* 8.5*  HCT 34.7*   < > 23.9*   < > 23.4* 23.6* 23.6*  --  24.1* 26.6*  MCV 111.6*  --  114.4*  --   --   --  115.1*  --  115.9* 120.9*  PLT 37*   < > 21*  --   --   --  16* 16* 23* 22*   < > = values in this interval not displayed.   Basic Metabolic Panel: Recent Labs  Lab 01/16/20 1226 01/16/20 1226 01/17/20 0253 01/18/20 0041 01/18/20 0505 01/19/20 0203 01/20/20 0452  NA 131*   < > 130* 134* 135 135 135  K 4.8   < > 4.5 4.0 4.2 5.6* 4.6  CL 91*   < > 96* 101 102 104 104  CO2 27   < > '24 25 25 22 '$ 21*  GLUCOSE 97   < > 119* 120* 102* 120* 104*  BUN 45*   < > 41* 36* 33* 30* 24*  CREATININE 1.37*   < > 0.77 0.71 0.60* <0.30* 0.33*  CALCIUM 8.4*   < > 7.8* 8.2* 8.6* 8.7* 8.9  MG 1.7  --   --   --   --   --   --    < > = values in this interval not displayed.   GFR: Estimated Creatinine Clearance: 121.2 mL/min (A) (by C-G formula based on SCr of 0.33 mg/dL (L)). Liver Function Tests: Recent Labs  Lab 01/17/20 0253 01/18/20 0041 01/18/20 0505 01/19/20 0203 01/20/20 0452  AST 168* 111* 103* 136* 115*  ALT 173* 129* 119* 119* 132*  ALKPHOS 104 116 108 96 106  BILITOT 29.5* 25.0* 24.6* 25.7* 32.7*  PROT 5.9* 6.0* 6.2* 5.8* 5.9*  ALBUMIN 3.1* 3.8 4.0 3.3* 3.4*   No results for input(s): LIPASE, AMYLASE in the last 168 hours. No results for input(s): AMMONIA in the last 168 hours. Coagulation Profile: Recent Labs  Lab 01/17/20 0253 01/18/20 0840 01/18/20 1648 01/19/20 0203 01/20/20 0452  INR 3.5* 3.7* 3.6* 3.3* 3.1*   Cardiac Enzymes: No results for input(s): CKTOTAL, CKMB, CKMBINDEX, TROPONINI in the last 168 hours. BNP (last 3 results) No  results for input(s): PROBNP in the last 8760 hours. HbA1C: No results for input(s): HGBA1C in the last 72 hours. CBG: Recent Labs  Lab 01/16/20 1959 01/16/20 2344  GLUCAP 108* 121*   Lipid Profile: No results for input(s): CHOL, HDL, LDLCALC, TRIG, CHOLHDL, LDLDIRECT in the last 72 hours. Thyroid Function Tests: No results for input(s): TSH, T4TOTAL, FREET4, T3FREE, THYROIDAB in the last 72 hours. Anemia Panel: No results for input(s): VITAMINB12, FOLATE, FERRITIN, TIBC, IRON, RETICCTPCT in the last 72 hours. Urine analysis:    Component Value Date/Time   COLORURINE AMBER (A) 01/16/2020 1245   APPEARANCEUR HAZY (A) 01/16/2020 1245   LABSPEC 1.013 01/16/2020 1245   PHURINE 6.0 01/16/2020 1245   GLUCOSEU NEGATIVE 01/16/2020 1245   HGBUR MODERATE (A) 01/16/2020 1245   BILIRUBINUR MODERATE (A) 01/16/2020 1245   BILIRUBINUR large (A) 03/19/2016 1103   BILIRUBINUR small 12/25/2014 1001   KETONESUR NEGATIVE 01/16/2020 1245   PROTEINUR NEGATIVE 01/16/2020 1245   UROBILINOGEN 4.0 03/19/2016 1103   UROBILINOGEN 0.2 01/21/2015 9485  NITRITE NEGATIVE 01/16/2020 [place] 01/16/2020 1245     Xavier Munger M.D. [facility] 01/20/2020, 12:29 PM   Call night coverage person covering after 7pm

## 2020-01-21 ENCOUNTER — Ambulatory Visit: Payer: Self-pay

## 2020-01-21 ENCOUNTER — Inpatient Hospital Stay (HOSPITAL_COMMUNITY): Payer: Self-pay

## 2020-01-21 DIAGNOSIS — K7030 Alcoholic cirrhosis of liver without ascites: Secondary | ICD-10-CM

## 2020-01-21 LAB — CULTURE, BLOOD (ROUTINE X 2)
Culture: NO GROWTH
Special Requests: ADEQUATE

## 2020-01-21 LAB — COMPREHENSIVE METABOLIC PANEL
ALT: 122 U/L — ABNORMAL HIGH (ref 0–44)
AST: 96 U/L — ABNORMAL HIGH (ref 15–41)
Albumin: 3.4 g/dL — ABNORMAL LOW (ref 3.5–5.0)
Alkaline Phosphatase: 120 U/L (ref 38–126)
Anion gap: 9 (ref 5–15)
BUN: 26 mg/dL — ABNORMAL HIGH (ref 6–20)
CO2: 24 mmol/L (ref 22–32)
Calcium: 8.8 mg/dL — ABNORMAL LOW (ref 8.9–10.3)
Chloride: 103 mmol/L (ref 98–111)
Creatinine, Ser: 0.33 mg/dL — ABNORMAL LOW (ref 0.61–1.24)
GFR calc Af Amer: >60 mL/min (ref >60)
GFR calc non Af Amer: >60 mL/min (ref >60)
Glucose, Bld: 97 mg/dL (ref 70–99)
Potassium: 4.6 mmol/L (ref 3.5–5.1)
Sodium: 136 mmol/L (ref 135–145)
Total Bilirubin: 41.8 mg/dL (ref 0.3–1.2)
Total Protein: 6.2 g/dL — ABNORMAL LOW (ref 6.5–8.1)

## 2020-01-21 LAB — COPPER, URINE - RANDOM OR 24 HOUR
Copper / Creatinine Ratio: 59 ug/g creat — ABNORMAL HIGH (ref 0–49)
Copper, Ur: 24 ug/L
Creatinine(Crt),U: 0.41 g/L (ref 0.30–3.00)

## 2020-01-21 LAB — CBC
HCT: 25.7 % — ABNORMAL LOW (ref 39.0–52.0)
Hemoglobin: 8.6 g/dL — ABNORMAL LOW (ref 13.0–17.0)
MCH: 38.2 pg — ABNORMAL HIGH (ref 26.0–34.0)
MCHC: 33.5 g/dL (ref 30.0–36.0)
MCV: 114.2 fL — ABNORMAL HIGH (ref 80.0–100.0)
Platelets: 22 10*3/uL — CL (ref 150–400)
RBC: 2.25 MIL/uL — ABNORMAL LOW (ref 4.22–5.81)
RDW: 16.3 % — ABNORMAL HIGH (ref 11.5–15.5)
WBC: 9.3 10*3/uL (ref 4.0–10.5)
nRBC: 0 % (ref 0.0–0.2)

## 2020-01-21 LAB — PROTIME-INR
INR: 3.4 — ABNORMAL HIGH (ref 0.8–1.2)
Prothrombin Time: 33 seconds — ABNORMAL HIGH (ref 11.4–15.2)

## 2020-01-21 MED ORDER — FUROSEMIDE 10 MG/ML IJ SOLN
20.0000 mg | Freq: Once | INTRAMUSCULAR | Status: AC
  Administered 2020-01-21: 20 mg via INTRAVENOUS
  Filled 2020-01-21: qty 2

## 2020-01-21 MED ORDER — IPRATROPIUM-ALBUTEROL 0.5-2.5 (3) MG/3ML IN SOLN
3.0000 mL | Freq: Four times a day (QID) | RESPIRATORY_TRACT | Status: DC
  Administered 2020-01-21: 3 mL via RESPIRATORY_TRACT
  Filled 2020-01-21: qty 3

## 2020-01-21 MED ORDER — IPRATROPIUM-ALBUTEROL 0.5-2.5 (3) MG/3ML IN SOLN: 3.0000 mL | Freq: Four times a day (QID) | RESPIRATORY_TRACT | Status: DC | PRN

## 2020-01-21 MED ORDER — CEFAZOLIN SODIUM-DEXTROSE 2-4 GM/100ML-% IV SOLN
2.0000 g | Freq: Three times a day (TID) | INTRAVENOUS | Status: DC
  Administered 2020-01-21 – 2020-01-22 (×3): 2 g via INTRAVENOUS
  Filled 2020-01-21 (×5): qty 100

## 2020-01-21 NOTE — Progress Notes (Signed)
CRITICAL VALUE ALERT  Critical Value:PLT: 22  Date & Time Notied:  01/21/2020 @ 0530  Provider Notified: X. Blount, NP @ [phone]  Orders Received/Actions taken: awaiting new orders.

## 2020-01-21 NOTE — Progress Notes (Signed)
CRITICAL VALUE ALERT  Critical Value:Total Bilirubin: 41.8   Date & Time Notied: 05/24 @ 0623  Provider Notified: X.Blount, NP @ [phone]  Orders Received/Actions taken: no new orders as of 0645

## 2020-01-21 NOTE — Progress Notes (Signed)
[name], male   DOB: 09/05/1965, 54 y.o.   MRN: [id number]    Progress Note   Subjective   Day # 5 CC : Severely decompensated EtOH induced cirrhosis/EtOH hepatitis/sepsis  Ammonia 76, INR 3.4 Creatinine 0.33 T bili 41.8/alk phos 120/AST 96/ALT 122 Hemoglobin 8.6/platelets22  Rocephin IV  Patient says he feels "okay" he denies any abdominal pain.  He has not been able to eat much, feels very full with any p.o. intake and has been having increased reflux.  Some queasiness no vomiting.  He says he feels very weak and dizzy pretty continuously. Patient sister and daughter were both present at bedside, and we had extensive conversation which included the patient.    Objective   Vital signs in last 24 hours: Temp:  [97.4 F (36.3 C)-98.1 F (36.7 C)] 98 F (36.7 C) (05/24 0810) Pulse Rate:  [97-110] 110 (05/24 0810) Resp:  [18-20] 20 (05/24 0810) BP: (133-141)/(75-82) 137/75 (05/24 0810) SpO2:  [97 %-100 %] 98 % (05/24 0810) Weight:  [112.6 kg] 112.6 kg (05/24 0542) Last BM Date: 01/20/20 General:    [name]  in NAD deeply icteric and ill-appearing.  Mentating well. Heart:  Regular rate and rhythm; no murmurs Lungs: Respirations even and unlabored, lungs CTA bilaterally Abdomen:  Soft, nontender and nondistended. Normal bowel sounds. Extremities: 2+ edema to the knee Neurologic:  Alert and oriented,  grossly normal neurologically.  No asterixis Psych:  Cooperative. Normal mood and affect.  Intake/Output from previous day: 05/23 0701 - 05/24 0700 In: 720 [P.O.:720] Out: 2400 [Urine:2400] Intake/Output this shift: No intake/output data recorded.  Lab Results: Recent Labs    01/19/20 0203 01/20/20 0452 01/21/20 0432  WBC 7.9 9.8 9.3  HGB 8.0* 8.5* 8.6*  HCT 24.1* 26.6* 25.7*  PLT 23* 22* 22*   BMET Recent Labs    01/19/20 0203 01/20/20 0452 01/21/20 0432  NA 135 135 136  K 5.6* 4.6 4.6  CL 104 104 103  CO2 22 21* 24  GLUCOSE 120*  104* 97  BUN 30* 24* 26*  CREATININE <0.30* 0.33* 0.33*  CALCIUM 8.7* 8.9 8.8*   LFT Recent Labs    01/21/20 0432  PROT 6.2*  ALBUMIN 3.4*  AST 96*  ALT 122*  ALKPHOS 120  BILITOT 41.8*   PT/INR Recent Labs    01/20/20 0452 01/21/20 0432  LABPROT 31.2* 33.0*  INR 3.1* 3.4*    Studies/Results: No results found.     Assessment / Plan:    #14 54 year old [name] with severely decompensated cirrhosis and alcoholic hepatitis who was readmitted to 5 days ago after fairly prolonged hospitalization for same. Patient found to be bacteremic with E. coli and Enterobacter, for which he is on Rocephin currently  Steroids have been discontinued.  Patient has had continued worsening of parameters including severe hyperbilirubinemia, progressively severe coagulopathy despite vitamin K and severe thrombocytopenia.  Fortunately he is still mentating well, on lactulose and Xifaxan  Kidney function remains stable.  Plan; for now continue and complete course of IV antibiotics Continue Xifaxan 550 twice daily and lactulose at current dose, can titrate as needed. Continue vitamin K Long discussion with the patient, sister and daughter this morning at bedside, and explained again that we really do not have any other options for therapy to improve his liver function.  Management at this point is supportive as above.  Chances of recovery are very small at this time, and believe he is reaching end-of-life.  We discussed  possibility of transitioning to palliative care over the next couple of days especially if parameters continue to worsen.  Patient is open to this option.  Family at this point not sure whether they would like him to remain in the hospital at present, or consider transitioning to hospice facility. Family will discuss further with palliative care/Dr. Domingo Cocking today.     Principal Problem:   E coli bacteremia Active Problems:   Alcoholic cirrhosis of liver with ascites  (HCC)   Cirrhosis (HCC)   Alcoholic hepatitis with ascites   Severe sepsis (HCC)   AKI (acute kidney injury) ([place])   Pneumatosis intestinalis     LOS: 5 days   Abdikadir Fohl PA-C 01/21/2020, 8:55 AM

## 2020-01-21 NOTE — Progress Notes (Signed)
[facility] for Infectious [facility]   Reason for visit: Follow up on bactermia  Interval History: no fever, wbc wnl.  Has been seen by palliative care and likely transitioning to hospice.   Family at bedside   Physical Exam: Constitutional:  Vitals:   01/21/20 1350 01/21/20 1353  BP: 115/66   Pulse: (!) 113   Resp: 20   Temp: 98.4 F (36.9 C)   SpO2: 100% 96%   patient appears in NAD Eyes: icteric Respiratory: Normal respiratory effort; CTA B Cardiovascular: RRR GI: soft, nt, nd  Review of Systems: Constitutional: negative for fevers and chills Gastrointestinal: negative for diarrhea  Lab Results  Component Value Date   WBC 9.3 01/21/2020   HGB 8.6 (L) 01/21/2020   HCT 25.7 (L) 01/21/2020   MCV 114.2 (H) 01/21/2020   PLT 22 (LL) 01/21/2020    Lab Results  Component Value Date   CREATININE 0.33 (L) 01/21/2020   BUN 26 (H) 01/21/2020   NA 136 01/21/2020   K 4.6 01/21/2020   CL 103 01/21/2020   CO2 24 01/21/2020    Lab Results  Component Value Date   ALT 122 (H) 01/21/2020   AST 96 (H) 01/21/2020   GGT 225 (H) 11/07/2017   ALKPHOS 120 01/21/2020     Microbiology: Recent Results (from the past 240 hour(s))  Blood Culture (routine x 2)     Status: Abnormal   Collection Time: 01/16/20 12:40 PM   Specimen: BLOOD  Result Value Ref Range Status   Specimen Description   Final    BLOOD SITE NOT SPECIFIED Performed at [facility], [address], [place], [place] [address]    Special Requests   Final    BOTTLES DRAWN AEROBIC AND ANAEROBIC Blood Culture adequate volume Performed at [facility] [address], [place], [place] [address]    Culture  Setup Time   Final    IN BOTH AEROBIC AND ANAEROBIC BOTTLES GRAM NEGATIVE RODS CRITICAL RESULT CALLED TO, READ BACK BY AND VERIFIED WITH: Lavell Luster [facility] 01/17/20 0455 JDW Performed at [facility], [place] [address] [place], [place] [address]    Culture ESCHERICHIA COLI (A)   Final   Report Status 01/19/2020 FINAL  Final   Organism ID, Bacteria ESCHERICHIA COLI  Final      Susceptibility   Escherichia coli - MIC*    AMPICILLIN >=32 RESISTANT Resistant     CEFAZOLIN <=4 SENSITIVE Sensitive     CEFEPIME <=1 SENSITIVE Sensitive     CEFTAZIDIME <=1 SENSITIVE Sensitive     CEFTRIAXONE <=1 SENSITIVE Sensitive     CIPROFLOXACIN <=0.25 SENSITIVE Sensitive     GENTAMICIN >=16 RESISTANT Resistant     IMIPENEM <=0.25 SENSITIVE Sensitive     TRIMETH/SULFA >=320 RESISTANT Resistant     AMPICILLIN/SULBACTAM 16 INTERMEDIATE Intermediate     PIP/TAZO <=4 SENSITIVE Sensitive     * ESCHERICHIA COLI  Blood Culture ID Panel (Reflexed)     Status: Abnormal   Collection Time: 01/16/20 12:40 PM  Result Value Ref Range Status   Enterococcus species NOT DETECTED NOT DETECTED Final   Listeria monocytogenes NOT DETECTED NOT DETECTED Final   Staphylococcus species NOT DETECTED NOT DETECTED Final   Staphylococcus aureus (BCID) NOT DETECTED NOT DETECTED Final   Streptococcus species NOT DETECTED NOT DETECTED Final   Streptococcus agalactiae NOT DETECTED NOT DETECTED Final   Streptococcus pneumoniae NOT DETECTED NOT DETECTED Final   Streptococcus pyogenes NOT DETECTED  NOT DETECTED Final   Acinetobacter baumannii NOT DETECTED NOT DETECTED Final   Enterobacteriaceae species DETECTED (A) NOT DETECTED Final    Comment: Enterobacteriaceae represent a large family of gram-negative bacteria, not a single organism. CRITICAL RESULT CALLED TO, READ BACK BY AND VERIFIED WITH: J GRIMSLEY PHARMD 01/17/20 0455 JDW    Enterobacter cloacae complex NOT DETECTED NOT DETECTED Final   Escherichia coli DETECTED (A) NOT DETECTED Final    Comment: CRITICAL RESULT CALLED TO, READ BACK BY AND VERIFIED WITH: J GRIMSLEY PHARMD 01/17/20 0455 JDW`    Klebsiella oxytoca NOT DETECTED NOT DETECTED Final   Klebsiella pneumoniae NOT DETECTED NOT DETECTED Final   Proteus species NOT DETECTED NOT DETECTED Final    Serratia marcescens NOT DETECTED NOT DETECTED Final   Carbapenem resistance NOT DETECTED NOT DETECTED Final   Haemophilus influenzae NOT DETECTED NOT DETECTED Final   Neisseria meningitidis NOT DETECTED NOT DETECTED Final   Pseudomonas aeruginosa NOT DETECTED NOT DETECTED Final   Candida albicans NOT DETECTED NOT DETECTED Final   Candida glabrata NOT DETECTED NOT DETECTED Final   Candida krusei NOT DETECTED NOT DETECTED Final   Candida parapsilosis NOT DETECTED NOT DETECTED Final   Candida tropicalis NOT DETECTED NOT DETECTED Final    Comment: Performed at [facility], [place] [address], [place], [place] [address]  Urine culture     Status: Abnormal   Collection Time: 01/16/20 12:44 PM   Specimen: In/Out Cath Urine  Result Value Ref Range Status   Specimen Description   Final    IN/OUT CATH URINE Performed at [facility] [address] [place], [place] [address]    Special Requests   Final    NONE Performed at [facility], [place] [address] [place], [place] [address]    Culture (A)  Final    >=100,000 COLONIES/mL ESCHERICHIA COLI MULTI-DRUG RESISTANT ORGANISM    Report Status 01/19/2020 FINAL  Final   Organism ID, Bacteria ESCHERICHIA COLI (A)  Final      Susceptibility   Escherichia coli - MIC*    AMPICILLIN >=32 RESISTANT Resistant     CEFAZOLIN <=4 SENSITIVE Sensitive     CEFTRIAXONE <=1 SENSITIVE Sensitive     CIPROFLOXACIN <=0.25 SENSITIVE Sensitive     GENTAMICIN >=16 RESISTANT Resistant     IMIPENEM <=0.25 SENSITIVE Sensitive     NITROFURANTOIN <=16 SENSITIVE Sensitive     TRIMETH/SULFA >=320 RESISTANT Resistant     AMPICILLIN/SULBACTAM 16 INTERMEDIATE Intermediate     PIP/TAZO <=4 SENSITIVE Sensitive     * >=100,000 COLONIES/mL ESCHERICHIA COLI  SARS Coronavirus 2 by RT PCR (hospital order, performed in [place] hospital lab) Nasopharyngeal Nasopharyngeal Swab     Status: None   Collection Time: 01/16/20 12:57 PM    Specimen: Nasopharyngeal Swab  Result Value Ref Range Status   SARS Coronavirus 2 NEGATIVE NEGATIVE Final    Comment: (NOTE) SARS-CoV-2 target nucleic acids are NOT DETECTED. The SARS-CoV-2 RNA is generally detectable in upper and lower respiratory specimens during the acute phase of infection. The lowest concentration of SARS-CoV-2 viral copies this assay can detect is 250 copies / mL. A negative result does not preclude SARS-CoV-2 infection and should not be used as the sole basis for treatment or other patient management decisions.  A negative result may occur with improper specimen collection / handling, submission of specimen other than nasopharyngeal swab, presence of viral mutation(s) within the areas targeted by this assay, and inadequate number of viral  copies (<250 copies / mL). A negative result must be combined with clinical observations, patient history, and epidemiological information. Fact Sheet for Patients:   StrictlyIdeas.no Fact Sheet for Healthcare Providers: BankingDealers.co.za This test is not yet approved or cleared  by the [place] FDA and has been authorized for detection and/or diagnosis of SARS-CoV-2 by FDA under an Emergency Use Authorization (EUA).  This EUA will remain in effect (meaning this test can be used) for the duration of the COVID-19 declaration under Section 564(b)(1) of the Act, 21 U.S.C. section 360bbb-3(b)(1), unless the authorization is terminated or revoked sooner. Performed at [facility], [place] [address], [place], [place] [address]   MRSA PCR Screening     Status: None   Collection Time: 01/16/20  7:04 PM   Specimen: Nasal Mucosa; Nasopharyngeal  Result Value Ref Range Status   MRSA by PCR NEGATIVE NEGATIVE Final    Comment:        The GeneXpert MRSA Assay (FDA approved for NASAL specimens only), is one component of a comprehensive MRSA  colonization surveillance program. It is not intended to diagnose MRSA infection nor to guide or monitor treatment for MRSA infections. Performed at [facility], [place] [address], [place], [place] [address]   Blood Culture (routine x 2)     Status: None   Collection Time: 01/16/20  7:51 PM   Specimen: BLOOD  Result Value Ref Range Status   Specimen Description   Final    BLOOD BLOOD RIGHT ARM Performed at [facility] [address] [place], [place] [address]    Special Requests   Final    BOTTLES DRAWN AEROBIC AND ANAEROBIC Blood Culture adequate volume Performed at [facility] [address], [place], [place] [address]    Culture   Final    NO GROWTH 5 DAYS Performed at [facility], [place] [address], [place], [place] [address]    Report Status 01/21/2020 FINAL  Final    Impression/Plan:  1. E coli bacteremia - urinary source.  I do not see any history of SBP so I don't see any indication for prophylaxis.  Will continue with antibiotics and needs 7 days total.  Will change to cefazolin and can use oral Keflex at discharge to complete the 7 days if needed, though is day 6 today.    2.  Liver failure - Total bili 41 which is increasing, INR 3.4 and likely transitioning to hospice soon.  I will sign off, call with any questions

## 2020-01-21 NOTE — Progress Notes (Signed)
   01/21/20 1350  Assess: MEWS Score  Temp 98.4 F (36.9 C)  BP 115/66  Pulse Rate (!) 113  Resp 20  SpO2 100 %  O2 Device Room Air  Assess: MEWS Score  MEWS Temp 0  MEWS Systolic 0  MEWS Pulse 2  MEWS RR 0  MEWS LOC 0  MEWS Score 2  MEWS Score Color Yellow  Assess: if the MEWS score is Yellow or Red  Were vital signs taken at a resting state? Yes  Focused Assessment Documented focused assessment  Early Detection of Sepsis Score *See Row Information* Low  MEWS guidelines implemented *See Row Information* No, other (Comment) (Pt end of life, baseline heart rate)   Heart rate baseline for patient. Patient is also end of life. Will not initiate yellow MEWS.

## 2020-01-21 NOTE — Progress Notes (Signed)
[facility]                                                                              Patient Demographics  Jesse Esparza, is a 54 y.o. male, DOB - Jan 01, 1966, XHB:[id number]  Admit date - 01/16/2020   Admitting Physician Darliss Cheney, MD  Outpatient Primary MD for the patient is Charlott Rakes, MD  Outpatient specialists:   LOS - 5  days   Medical records reviewed and are as summarized below:    Chief Complaint  Patient presents with  . Weakness  . Abdominal Distention       Brief summary   Admit date:01/16/2020 54 y.o.malewith alcoholic cirrhosis, GERD, hyperlipidemia, hypertension, obesity who was recently hospitalized from 5/4/-01/11/2020 for acute alcoholic hepatitis, underwent paracentesis on 5/5 with drainage of 1.4 L of yellow ascitic fluid,  also had EGD on 5/14 subsequently discharged on twice daily Protonix; called GI office on 5/19 c/o crampy RLQ abdominal pain 4/10, feeling very weak, dizzy with some subjective fever and chills.He was advised to come to the emergency department.  ED Course: Afebrile,tachycardic, tachypneic and hypotensive with blood pressure of 89/61. He was given a liter of IV fluid with not much improvement in his blood pressure. 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 Elevated lactic acid. Elevated creatinine of 1.37. Worsened hyperbilirubinemia as well as LFTs. Abdominal x-ray was done which was unremarkable.  Hospital course:Patient underwent CT abd showing benign pneumatosis in the ascending colon, with a few adjacent foci of extraluminal free air, suspected small amount of mesenteric venous gas  and free air in the absence of bowel perforation. Cholelithiasis,Cirrhosis with portal hypertension, splenomegaly, and small volume abdominopelvic ascites not enough to tap per IR (initially requested  diagnostic tap to r/o peritonitis).    Assessment & Plan    Principal Problem:  Severe sepsis, E. coli bacteremia, E. coli UTI -Patient met sepsis criteria at the time of admission based on leukocytosis, tachycardia, tachypnea, lactic acidosis. -CT abdomen on admission showed benign pneumatosis in the ascending colon with few adjacent foci of extraluminal free air, cirrhosis with portal hypertension -Patient was placed on empiric IV Rocephin for subacute bacterial peritonitis on admission -Blood cultures, urine cultures positive for E. coli, sensitive to IV Rocephin - GI and ID following closely, patient was placed on IV Rocephin and oral Flagyl (added on 5/22 per ID recommendations).   -For now continue IV Rocephin, Flagyl discontinued (bilirubin trending up, no evidence of hyperbilirubinemia with metronidazole found on literature review, however only new medication recently started in last 2 days). -Can give IV albumin for hypotension as needed -Per family, patient having congestion and difficulty breathing at night, stat chest x-ray negative for acute abnormality, gave Lasix 20 mg IV x1, currently no acute hypoxia, patient speaking in full sentences.  Chronic alcoholic hepatitis with cirrhosis, decompensated, intra-abdominal infection -Recent admission with alcoholic hepatitis, DM, continues to worsen, total bili 32.7 (<--25.7 on 5/22),  -Patient was placed on home dose of prednisolone, lactulose, rifaximin. Completing prednisolone today. Avoiding steroids due to #1, discussed with GI  on 5/22 -Continue IV Rocephin, lactulose, rifaximin -Continue oral Lasix 20 mg daily, 20 mg IV x1 today -Overall poor prognosis with ESLD, worsening symptoms, hyperbilirubinemia 41.8 today.  Appreciate palliative medicine addressing goals of care with patient and family.  I discussed with Mr. Huish, his sister and daughter at the bedside, they are leaning towards comfort and hospice   Chronic  thrombocytopenia, acute on chronic microcytic anemia, chronic GI blood loss -Recent EGD previous admission on 5/14-portal hypertensive gastropathy, grade 1 varices, no active bleeding No further epistaxis, received 1 unit platelets pheresis on 5/21, continue vitamin K 10 mg p.o. daily for 3 days -Platelets 22K   Acute kidney injury -Creatinine 1.3 at the time of admission was 1.0 at the time of discharge on 5/14 -Creatinine currently stable, continue low-dose oral Lasix  Hyperkalemia -Resolved  Leukocytosis -Resolved, secondary to sepsis versus chronic prednisone use  Dizziness -Patient has been complaining of dizziness since the previous admission.  States he feels debilitated and unable to walk due to dizziness and acute medical issues. -2D echo 01/02/2020 had shown EF 70 to 75% with grade 1 diastolic dysfunction, no aortic stenosis - No focal neurological deficits, no vertigo  Code Status: Full code DVT Prophylaxis: SCDs Family Communication: Discussed all imaging results, lab results, explained to the patient, sister and daughter at the bedside.     Disposition Plan:     Status is: Inpatient  Remains inpatient appropriate because:Inpatient level of care appropriate due to severity of illness   Dispo: The patient is from: Home              Anticipated d/c is to: Home              Anticipated d/c date is: > 3 days              Patient currently is not medically stable to d/c.       Time Spent in minutes 35 minutes  Procedures:  CT abdomen pelvis Consultants:   Gastroenterology  Antimicrobials:   Anti-infectives (From admission, onward)   Start     Dose/Rate Route Frequency Ordered Stop   01/19/20 1400  metroNIDAZOLE (FLAGYL) tablet 500 mg  Status:  Discontinued     500 mg Oral Every 8 hours 01/19/20 0930 01/21/20 0847   01/17/20 2200  cefTRIAXone (ROCEPHIN) 2 g in sodium chloride 0.9 % 100 mL IVPB     2 g 200 mL/hr over 30 Minutes Intravenous Every 24 hours  01/16/20 2051     01/16/20 2200  ceFEPIme (MAXIPIME) 2 g in sodium chloride 0.9 % 100 mL IVPB  Status:  Discontinued     2 g 200 mL/hr over 30 Minutes Intravenous Every 8 hours 01/16/20 1451 01/16/20 1709   01/16/20 2200  rifaximin (XIFAXAN) tablet 550 mg     550 mg Oral 2 times daily 01/16/20 1455     01/16/20 1800  cefTRIAXone (ROCEPHIN) 2 g in sodium chloride 0.9 % 100 mL IVPB  Status:  Discontinued     2 g 200 mL/hr over 30 Minutes Intravenous Every 24 hours 01/16/20 1709 01/16/20 2051   01/16/20 1300  ceFEPIme (MAXIPIME) 2 g in sodium chloride 0.9 % 100 mL IVPB     2 g 200 mL/hr over 30 Minutes Intravenous  Once 01/16/20 1249 01/16/20 1348         Medications  Scheduled Meds: . feeding supplement (PRO-STAT SUGAR FREE 64)  30 mL Oral TID BM  . furosemide  20 mg  Oral Daily  . ipratropium-albuterol  3 mL Nebulization Q6H  . lactulose  20 g Oral TID  . mouth rinse  15 mL Mouth Rinse BID  . multivitamin with minerals  1 tablet Oral Daily  . pantoprazole  40 mg Oral BID  . rifaximin  550 mg Oral BID  . sodium chloride flush  3 mL Intravenous Q12H   Continuous Infusions: . sodium chloride 250 mL (01/20/20 2157)  . cefTRIAXone (ROCEPHIN)  IV 2 g (01/20/20 2159)  . sodium chloride     PRN Meds:.sodium chloride, ondansetron **OR** ondansetron (ZOFRAN) IV, traMADol      Subjective:   Panfilo Ketchum was seen and examined today.  Appears much worsened today, per sister at the bedside had difficulty breathing overnight.  Feels very dizzy, deconditioned and weak.  Significant ascites, distended, 3+ pitting edema in the lower extremities.  Objective:   Vitals:   01/20/20 1356 01/20/20 2033 01/21/20 0542 01/21/20 0810  BP: (!) 141/82 133/81 137/76 137/75  Pulse: (!) 105 97 (!) 105 (!) 110  Resp: '20 20 18 20  '$ Temp: 98 F (36.7 C) (!) 97.4 F (36.3 C) 98.1 F (36.7 C) 98 F (36.7 C)  TempSrc: Oral Oral Oral Oral  SpO2: 100% 97% 97% 98%  Weight:   112.6 kg   Height:         Intake/Output Summary (Last 24 hours) at 01/21/2020 1251 Last data filed at 01/21/2020 0550 Gross per 24 hour  Intake 480 ml  Output 1750 ml  Net -1270 ml     Wt Readings from Last 3 Encounters:  01/21/20 112.6 kg  01/11/20 115 kg  11/07/17 99.3 kg   Physical Exam  General: Alert and oriented x 3, NAD, jaundice, accelerated icterus  Cardiovascular: S1 S2 clear, RRR. No pedal edema b/l  Respiratory: CTAB, no wheezing, rales or rhonchi  Gastrointestinal: Soft, massively distended, ascites  Ext: 3+ pedal edema bilaterally  Neuro: no new deficits  Musculoskeletal: No cyanosis, clubbing  Skin: No rashes  Psych: Oriented, flat affect   data Reviewed:  I have personally reviewed following labs and imaging studies  Micro Results Recent Results (from the past 240 hour(s))  Blood Culture (routine x 2)     Status: Abnormal   Collection Time: 01/16/20 12:40 PM   Specimen: BLOOD  Result Value Ref Range Status   Specimen Description   Final    BLOOD SITE NOT SPECIFIED Performed at [facility], [address], [place], [place] [address]    Special Requests   Final    BOTTLES DRAWN AEROBIC AND ANAEROBIC Blood Culture adequate volume Performed at [facility] [address] [place], [place] [address]    Culture  Setup Time   Final    IN BOTH AEROBIC AND ANAEROBIC BOTTLES GRAM NEGATIVE RODS CRITICAL RESULT CALLED TO, READ BACK BY AND VERIFIED WITH: Lavell Luster [facility] 01/17/20 0455 JDW Performed at [facility], [place] [address], [place], [place] [address]    Culture ESCHERICHIA COLI (A)  Final   Report Status 01/19/2020 FINAL  Final   Organism ID, Bacteria ESCHERICHIA COLI  Final      Susceptibility   Escherichia coli - MIC*    AMPICILLIN >=32 RESISTANT Resistant     CEFAZOLIN <=4 SENSITIVE Sensitive     CEFEPIME <=1 SENSITIVE Sensitive     CEFTAZIDIME <=1 SENSITIVE Sensitive     CEFTRIAXONE <=1 SENSITIVE Sensitive     CIPROFLOXACIN  <=0.25 SENSITIVE Sensitive  GENTAMICIN >=16 RESISTANT Resistant     IMIPENEM <=0.25 SENSITIVE Sensitive     TRIMETH/SULFA >=320 RESISTANT Resistant     AMPICILLIN/SULBACTAM 16 INTERMEDIATE Intermediate     PIP/TAZO <=4 SENSITIVE Sensitive     * ESCHERICHIA COLI  Blood Culture ID Panel (Reflexed)     Status: Abnormal   Collection Time: 01/16/20 12:40 PM  Result Value Ref Range Status   Enterococcus species NOT DETECTED NOT DETECTED Final   Listeria monocytogenes NOT DETECTED NOT DETECTED Final   Staphylococcus species NOT DETECTED NOT DETECTED Final   Staphylococcus aureus (BCID) NOT DETECTED NOT DETECTED Final   Streptococcus species NOT DETECTED NOT DETECTED Final   Streptococcus agalactiae NOT DETECTED NOT DETECTED Final   Streptococcus pneumoniae NOT DETECTED NOT DETECTED Final   Streptococcus pyogenes NOT DETECTED NOT DETECTED Final   Acinetobacter baumannii NOT DETECTED NOT DETECTED Final   Enterobacteriaceae species DETECTED (A) NOT DETECTED Final    Comment: Enterobacteriaceae represent a large family of gram-negative bacteria, not a single organism. CRITICAL RESULT CALLED TO, READ BACK BY AND VERIFIED WITH: J GRIMSLEY PHARMD 01/17/20 0455 JDW    Enterobacter cloacae complex NOT DETECTED NOT DETECTED Final   Escherichia coli DETECTED (A) NOT DETECTED Final    Comment: CRITICAL RESULT CALLED TO, READ BACK BY AND VERIFIED WITH: J GRIMSLEY PHARMD 01/17/20 0455 JDW`    Klebsiella oxytoca NOT DETECTED NOT DETECTED Final   Klebsiella pneumoniae NOT DETECTED NOT DETECTED Final   Proteus species NOT DETECTED NOT DETECTED Final   Serratia marcescens NOT DETECTED NOT DETECTED Final   Carbapenem resistance NOT DETECTED NOT DETECTED Final   Haemophilus influenzae NOT DETECTED NOT DETECTED Final   Neisseria meningitidis NOT DETECTED NOT DETECTED Final   Pseudomonas aeruginosa NOT DETECTED NOT DETECTED Final   Candida albicans NOT DETECTED NOT DETECTED Final   Candida glabrata NOT  DETECTED NOT DETECTED Final   Candida krusei NOT DETECTED NOT DETECTED Final   Candida parapsilosis NOT DETECTED NOT DETECTED Final   Candida tropicalis NOT DETECTED NOT DETECTED Final    Comment: Performed at [facility], [place] [address], [place], [place] [address]  Urine culture     Status: Abnormal   Collection Time: 01/16/20 12:44 PM   Specimen: In/Out Cath Urine  Result Value Ref Range Status   Specimen Description   Final    IN/OUT CATH URINE Performed at [facility] [address], [place], [place] [address]    Special Requests   Final    NONE Performed at [facility], [place] [address], [place], [place] [address]    Culture (A)  Final    >=100,000 COLONIES/mL ESCHERICHIA COLI MULTI-DRUG RESISTANT ORGANISM    Report Status 01/19/2020 FINAL  Final   Organism ID, Bacteria ESCHERICHIA COLI (A)  Final      Susceptibility   Escherichia coli - MIC*    AMPICILLIN >=32 RESISTANT Resistant     CEFAZOLIN <=4 SENSITIVE Sensitive     CEFTRIAXONE <=1 SENSITIVE Sensitive     CIPROFLOXACIN <=0.25 SENSITIVE Sensitive     GENTAMICIN >=16 RESISTANT Resistant     IMIPENEM <=0.25 SENSITIVE Sensitive     NITROFURANTOIN <=16 SENSITIVE Sensitive     TRIMETH/SULFA >=320 RESISTANT Resistant     AMPICILLIN/SULBACTAM 16 INTERMEDIATE Intermediate     PIP/TAZO <=4 SENSITIVE Sensitive     * >=100,000 COLONIES/mL ESCHERICHIA COLI  SARS Coronavirus 2 by RT PCR (hospital order, performed in [facility] hospital lab) Nasopharyngeal Nasopharyngeal Swab  Status: None   Collection Time: 01/16/20 12:57 PM   Specimen: Nasopharyngeal Swab  Result Value Ref Range Status   SARS Coronavirus 2 NEGATIVE NEGATIVE Final    Comment: (NOTE) SARS-CoV-2 target nucleic acids are NOT DETECTED. The SARS-CoV-2 RNA is generally detectable in upper and lower respiratory specimens during the acute phase of infection. The lowest concentration of SARS-CoV-2 viral copies this  assay can detect is 250 copies / mL. A negative result does not preclude SARS-CoV-2 infection and should not be used as the sole basis for treatment or other patient management decisions.  A negative result may occur with improper specimen collection / handling, submission of specimen other than nasopharyngeal swab, presence of viral mutation(s) within the areas targeted by this assay, and inadequate number of viral copies (<250 copies / mL). A negative result must be combined with clinical observations, patient history, and epidemiological information. Fact Sheet for Patients:   StrictlyIdeas.no Fact Sheet for Healthcare Providers: BankingDealers.co.za This test is not yet approved or cleared  by the [place] FDA and has been authorized for detection and/or diagnosis of SARS-CoV-2 by FDA under an Emergency Use Authorization (EUA).  This EUA will remain in effect (meaning this test can be used) for the duration of the COVID-19 declaration under Section 564(b)(1) of the Act, 21 U.S.C. section 360bbb-3(b)(1), unless the authorization is terminated or revoked sooner. Performed at [facility], [place] [address] [place], [place] [address]   MRSA PCR Screening     Status: None   Collection Time: 01/16/20  7:04 PM   Specimen: Nasal Mucosa; Nasopharyngeal  Result Value Ref Range Status   MRSA by PCR NEGATIVE NEGATIVE Final    Comment:        The GeneXpert MRSA Assay (FDA approved for NASAL specimens only), is one component of a comprehensive MRSA colonization surveillance program. It is not intended to diagnose MRSA infection nor to guide or monitor treatment for MRSA infections. Performed at [facility], [place] [address], [place], [place] [address]   Blood Culture (routine x 2)     Status: None   Collection Time: 01/16/20  7:51 PM   Specimen: BLOOD  Result Value Ref Range Status   Specimen  Description   Final    BLOOD BLOOD RIGHT ARM Performed at [facility] [address] [place], [place] [address]    Special Requests   Final    BOTTLES DRAWN AEROBIC AND ANAEROBIC Blood Culture adequate volume Performed at [facility] [address] [place], [place] [address]    Culture   Final    NO GROWTH 5 DAYS Performed at [facility], [place] [address] [place], [place] [address]    Report Status 01/21/2020 FINAL  Final    Radiology Reports CT ABDOMEN PELVIS WO CONTRAST  Result Date: 01/16/2020 CLINICAL DATA:  Abdominal pain and distension. History of cirrhosis. EXAM: CT ABDOMEN AND PELVIS WITHOUT CONTRAST TECHNIQUE: Multidetector CT imaging of the abdomen and pelvis was performed following the standard protocol without IV contrast. COMPARISON:  Most recent CT fifteen days ago 01/01/2020. FINDINGS: Lower chest: Trace right pleural effusion/thickening. Rounded density in the lower most right lung base is new from prior exam and likely atelectasis, series 6, image 15. Hepatobiliary: Cirrhotic hepatic morphology with nodular contours. No obvious focal lesion on noncontrast exam. Small gallstones within the gallbladder. No biliary dilatation. Pancreas: No ductal dilatation or inflammation. Spleen: Splenomegaly with spleen measuring 14 x 6.8 x  14.6 cm (volume = 730 cm^3). Findings are grossly stable allowing for differences in caliper placement. Adrenals/Urinary Tract: Minimal thickening of the right adrenal gland without dominant nodule. No hydronephrosis. No renal or ureteral calculi. Urinary bladder is physiologically distended. Stomach/Bowel: Stomach is unremarkable. Administered enteric contrast reaches the mid distal small bowel. Mild wall thickening of the duodenum and proximal small bowel loops. No obstruction. Portions of normal appendix are visualized, partially obscured by adjacent ascites. There is mottled air in the ascending colon  suspicious for pneumatosis, with a few adjacent foci of extraluminal air, for example series 2, image 48 and 47. Small focus of air centrally may be within the mesenteric veins versus mesenteric fat, series 2, image 46. there is no definite associated colonic wall thickening or pericolonic edema. Formed stool throughout the more distal colon with scattered diverticula. No focal diverticulitis. Vascular/Lymphatic: Aortic atherosclerosis. Perisplenic collaterals with splenorenal shunting were better appreciated on prior contrast-enhanced exam. Tiny focus of air in the right mesentery which may be intravascular versus in the mesenteric fat, series 2, image 46. possible additional tiny mesenteric venous focus in the central abdomen, series 2, image 37. Prominent periportal lymph nodes which are likely reactive. Reproductive: Stable prostate gland. Other: Small volume abdominopelvic ascites, greatest volume in the right upper and lower quadrants. Tiny foci of free air adjacent to the ascending colon in the right abdomen. No evidence of intra-abdominal abscess. There is a small fat containing umbilical hernia. Musculoskeletal: Degenerative disc disease and facet hypertrophy of the lumbar spine. There are no acute or suspicious osseous abnormalities. IMPRESSION: 1. Findings suspicious for pneumatosis in the ascending colon, with a few adjacent foci of extraluminal free air. Small amount of mesenteric venous gas is suspected in the region of pneumatosis. However, there is no significant colonic wall thickening or inflammatory change in the region of pneumatosis and extraluminal air. This may be benign pneumatosis, and free air in the absence of bowel perforation has been described, however this is indeterminate on imaging. Close clinical follow-up is recommended. 2. Mild wall thickening of the duodenum and proximal small bowel loops, likely reactive. 3. Cirrhosis with portal hypertension, splenomegaly, and small volume  abdominopelvic ascites. 4. Cholelithiasis. Aortic Atherosclerosis (ICD10-I70.0). These results were called by telephone at the time of interpretation on 01/16/2020 at 5:34 pm to Dr Darliss Cheney , who verbally acknowledged these results. Electronically Signed   By: Keith Rake M.D.   On: 01/16/2020 17:37   DG Chest 2 View  Result Date: 01/08/2020 CLINICAL DATA:  Chest pain.  Pleural effusion. EXAM: CHEST - 2 VIEW COMPARISON:  05/23/2017; CT abdomen pelvis-01/01/2020 FINDINGS: Grossly unchanged borderline enlarged cardiac silhouette and mediastinal contours with mild tortuosity and potential ectasia of the thoracic aorta. Grossly unchanged minimal left basilar/retrocardiac heterogeneous opacities favored to represent atelectasis. No new focal airspace opacities. No pleural effusion or pneumothorax. No evidence of edema. No acute osseous abnormalities. Stigmata of dish within the thoracic spine. IMPRESSION: Similar findings of borderline cardiomegaly and left basilar atelectasis/scar without superimposed acute cardiopulmonary disease. Specifically, no evidence of pleural effusion. Electronically Signed   By: Sandi Mariscal M.D.   On: 01/08/2020 17:12   CT ABDOMEN PELVIS W CONTRAST  Result Date: 01/01/2020 CLINICAL DATA:  Cirrhosis, weight gain, abdominal distension, leg swelling, jaundice, history GERD, hypertension EXAM: CT ABDOMEN AND PELVIS WITH CONTRAST TECHNIQUE: Multidetector CT imaging of the abdomen and pelvis was performed using the standard protocol following bolus administration of intravenous contrast. Sagittal and coronal MPR images reconstructed from axial  data set. CONTRAST:  127m OMNIPAQUE IOHEXOL 300 MG/ML SOLN IV. No oral contrast. COMPARISON:  04/05/2016 FINDINGS: Lower chest: Subsegmental atelectasis RIGHT lower lobe. Tiny RIGHT pleural effusion. Hepatobiliary: Dependent calculi within gallbladder. Gallbladder wall appears thickened, nonspecific in the setting of ascites. Heterogeneous  cirrhotic appearing liver without discrete mass. No intrahepatic or extrahepatic biliary dilatation. Pancreas: Normal appearance Spleen: Mildly enlarged, 14.6 x 13.6 x 5.3 cm (volume = 550 cm^3). No focal mass. Adrenals/Urinary Tract: Adrenal glands, kidneys, ureters, and bladder normal appearance Stomach/Bowel: Normal appendix. Stomach and bowel loops normal appearance Vascular/Lymphatic: Aorta normal caliber with mild scattered atherosclerotic calcifications. Vascular structures patent. Large perisplenic collateral extending to LEFT renal vein consistent with spontaneous splenorenal shunt. Major vascular structures patent. No adenopathy. Reproductive: Unremarkable seminal vesicles. Prostate gland minimally enlarged at 4.8 x 3.5 cm image 96. Other: Moderate ascites. No free air. No hernia. Scattered edema/infiltration of tissue planes in abdomen. 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 Moderate ascites. Cholelithiasis. Minimal prostatic enlargement. Electronically Signed   By: MLavonia DanaM.D.   On: 01/01/2020 12:54   [place]domen Limited  Result Date: 01/16/2020 CLINICAL DATA:  History of cirrhosis. Please perform ascites search ultrasound and ultrasound-guided paracentesis as indicated. EXAM: LIMITED ABDOMEN ULTRASOUND FOR ASCITES TECHNIQUE: Limited ultrasound survey for ascites was performed in all four abdominal quadrants. COMPARISON:  Ultrasound-guided paracentesis-01/08/2020 (yielding 1.2 L peritoneal fluid) FINDINGS: Sonographic evaluation of the abdomen demonstrates a trace amount intra-abdominal ascites within the bilateral lower abdominal quadrants, too small to allow for safe ultrasound-guided paracentesis. IMPRESSION: Trace amount of intra-abdominal ascites, too small to allow for safe ultrasound-guided paracentesis. No paracentesis attempted. Electronically Signed   By: JSandi MariscalM.D.   On: 01/16/2020 16:55   [place] Paracentesis  Result Date: 01/08/2020 INDICATION: Alcoholic hepatitis and decompensated cirrhosis. Recurrent ascites. Request for therapeutic paracentesis. EXAM: ULTRASOUND GUIDED RIGHT LOWER QUADRANT PARACENTESIS MEDICATIONS: None. COMPLICATIONS: None immediate. PROCEDURE: Informed written consent was obtained from the patient after a discussion of the risks, benefits and alternatives to treatment. A timeout was performed prior to the initiation of the procedure. Initial ultrasound scanning demonstrates a small to moderate amount of ascites within the right lower abdominal quadrant. The right lower abdomen was prepped and draped in the usual sterile fashion. 1% lidocaine was used for local anesthesia. Following this, a 19 gauge, 10-cm, Yueh catheter was introduced. An ultrasound image was saved for documentation purposes. The paracentesis was performed. The catheter was removed and a dressing was applied. The patient tolerated the procedure well without immediate post procedural complication. FINDINGS: A total of approximately 1.2 L of clear, bright yellow fluid was removed. IMPRESSION: Successful ultrasound-guided paracentesis yielding 1.2 liters of peritoneal fluid. Read by: KAscencion DikePA-C Electronically Signed   By: JSandi MariscalM.D.   On: 01/08/2020 16:11   [place]  Result Date: 01/02/2020 INDICATION: Patient with history of acute alcoholic hepatitis/chronic alcoholic cirrhosis, ascites; request received for diagnostic and therapeutic paracentesis. EXAM: ULTRASOUND GUIDED DIAGNOSTIC AND THERAPEUTIC PARACENTESIS MEDICATIONS: None COMPLICATIONS: None immediate. PROCEDURE: Informed written consent was obtained from the patient after a discussion of the risks, benefits and alternatives to treatment. A timeout was performed prior to the initiation of the procedure. Initial ultrasound scanning demonstrates a small amount of ascites within the right lower abdominal quadrant. The right lower abdomen was  prepped and draped in the usual sterile fashion. 1% lidocaine was used for local anesthesia. Following this, a 19 gauge, 10-cm, Yueh catheter was introduced. An  ultrasound image was saved for documentation purposes. The paracentesis was performed. The catheter was removed and a dressing was applied. The patient tolerated the procedure well without immediate post procedural complication. FINDINGS: A total of approximately 1.4 liters of clear, golden yellow fluid was removed. Samples were sent to the laboratory as requested by the clinical team. IMPRESSION: Successful ultrasound-guided diagnostic and therapeutic paracentesis yielding 1.4 liters of peritoneal fluid. Read by: Rowe Robert, PA-C Electronically Signed   By: Corrie Mckusick D.O.   On: 01/02/2020 12:44   DG CHEST PORT 1 VIEW  Result Date: 01/21/2020 CLINICAL DATA:  Shortness of breath EXAM: PORTABLE CHEST 1 VIEW COMPARISON:  01/16/2020 FINDINGS: Heart is at the upper limits of normal in size. The lungs are clear bilaterally. No acute bony abnormality is noted. IMPRESSION: No active disease. Electronically Signed   By: Inez Catalina M.D.   On: 01/21/2020 09:57   DG Chest Port 1 View  Result Date: 01/16/2020 CLINICAL DATA:  Weak. Additional provided: Patient reports weakness, abdominal distension that is more than usual, intermittent dizziness when standing, history of liver failure. EXAM: PORTABLE CHEST 1 VIEW COMPARISON:  Prior chest radiograph 01/08/2020 FINDINGS: Borderline cardiomegaly, unchanged. No appreciable airspace consolidation within the lungs. Redemonstrated mild scarring and/or atelectasis within the left lung base. No evidence of pleural effusion or pneumothorax. No acute bony abnormality identified. IMPRESSION: No evidence of acute cardiopulmonary abnormality. Borderline cardiomegaly, unchanged. Redemonstrated mild scarring and/or atelectasis within the left lung base. Electronically Signed   By: Kellie Simmering DO   On: 01/16/2020 13:27    ECHOCARDIOGRAM COMPLETE  Result Date: 01/02/2020    ECHOCARDIOGRAM REPORT   Patient Name:   AMOR PACKARD Date of Exam: 01/02/2020 Medical Rec #:  [id number]       Height:       65.0 in Accession #:    [id number]      Weight:       254.1 lb Date of Birth:  22-Apr-1966      BSA:          2.190 m Patient Age:    67 years        BP:           111/67 mmHg Patient Gender: M               HR:           93 bpm. Exam Location:  Inpatient Procedure: 2D Echo, Cardiac Doppler and Color Doppler Indications:    R94.31 Abnormal EKG  History:        Patient has no prior history of Echocardiogram examinations.                 Risk Factors:Hypertension. ETOH. Edema. Ascites.  Sonographer:    Roseanna Rainbow RDCS Referring Phys: [id number] [place] Comments: Suboptimal parasternal window, no subcostal window, Technically difficult study due to poor echo windows and patient is morbidly obese. Image acquisition challenging due to patient body habitus. Study is to rule out right heart failure. IMPRESSIONS  1. Left ventricular ejection fraction, by estimation, is 70 to 75%. The left ventricle has hyperdynamic function. The left ventricle has no regional wall motion abnormalities. There is moderate left ventricular hypertrophy. Left ventricular diastolic parameters are consistent with Grade I diastolic dysfunction (impaired relaxation).  2. Right ventricular systolic function is normal. The right ventricular size is normal.  3. The mitral valve is normal in structure. No evidence of mitral valve regurgitation. No evidence of  mitral stenosis.  4. The aortic valve was not well visualized. Aortic valve regurgitation is not visualized. No aortic stenosis is present.  5. The inferior vena cava is normal in size with greater than 50% respiratory variability, suggesting right atrial pressure of 3 mmHg. FINDINGS  Left Ventricle: Left ventricular ejection fraction, by estimation, is 70 to 75%. The left ventricle has  hyperdynamic function. The left ventricle has no regional wall motion abnormalities. The left ventricular internal cavity size was normal in size. There is moderate left ventricular hypertrophy. Left ventricular diastolic parameters are consistent with Grade I diastolic dysfunction (impaired relaxation). Right Ventricle: The right ventricular size is normal. No increase in right ventricular wall thickness. Right ventricular systolic function is normal. Left Atrium: Left atrial size was normal in size. Right Atrium: Right atrial size was normal in size. Pericardium: There is no evidence of pericardial effusion. Mitral Valve: The mitral valve is normal in structure. Normal mobility of the mitral valve leaflets. No evidence of mitral valve regurgitation. No evidence of mitral valve stenosis. Tricuspid Valve: The tricuspid valve is normal in structure. Tricuspid valve regurgitation is not demonstrated. No evidence of tricuspid stenosis. Aortic Valve: The aortic valve was not well visualized. Aortic valve regurgitation is not visualized. No aortic stenosis is present. Pulmonic Valve: The pulmonic valve was normal in structure. Pulmonic valve regurgitation is not visualized. No evidence of pulmonic stenosis. Aorta: The aortic root is normal in size and structure. Venous: The inferior vena cava is normal in size with greater than 50% respiratory variability, suggesting right atrial pressure of 3 mmHg. IAS/Shunts: No atrial level shunt detected by color flow Doppler.  LEFT VENTRICLE PLAX 2D LVIDd:         4.08 cm     Diastology LVIDs:         2.54 cm     LV e' lateral:   9.79 cm/s LV PW:         1.46 cm     LV E/e' lateral: 9.0 LV IVS:        1.33 cm     LV e' medial:    7.40 cm/s LVOT diam:     2.10 cm     LV E/e' medial:  11.9 LV SV:         80 LV SV Index:   37 LVOT Area:     3.46 cm  LV Volumes (MOD) LV vol d, MOD A2C: 48.6 ml LV vol d, MOD A4C: 68.5 ml LV vol s, MOD A2C: 11.8 ml LV vol s, MOD A4C: 17.0 ml LV SV MOD  A2C:     36.8 ml LV SV MOD A4C:     68.5 ml LV SV MOD BP:      45.2 ml RIGHT VENTRICLE RV S prime:     13.40 cm/s TAPSE (M-mode): 2.0 cm LEFT ATRIUM             Index       RIGHT ATRIUM           Index LA diam:        3.70 cm 1.69 cm/m  RA Area:     12.50 cm LA Vol (A2C):   31.2 ml 14.25 ml/m RA Volume:   20.40 ml  9.32 ml/m LA Vol (A4C):   46.5 ml 21.23 ml/m LA Biplane Vol: 40.8 ml 18.63 ml/m  AORTIC VALVE LVOT Vmax:   134.00 cm/s LVOT Vmean:  96.000 cm/s LVOT VTI:    0.231 m  AORTA Ao Root diam: 3.30 cm Ao Asc diam:  3.20 cm MITRAL VALVE MV Area (PHT): 3.31 cm     SHUNTS MV Decel Time: 229 msec     Systemic VTI:  0.23 m MV E velocity: 87.80 cm/s   Systemic Diam: 2.10 cm MV A velocity: 123.00 cm/s MV E/A ratio:  0.71 Candee Furbish MD Electronically signed by Candee Furbish MD Signature Date/Time: 01/02/2020/11:01:13 AM    Final     Lab Data:  CBC: Recent Labs  Lab 01/16/20 1226 01/16/20 1226 01/17/20 0253 01/17/20 1308 01/17/20 0908 01/17/20 1646 01/18/20 0041 01/18/20 1648 01/19/20 0203 01/20/20 0452 01/21/20 0432  WBC 17.0*   < > 10.7*  --   --   --  7.7  --  7.9 9.8 9.3  NEUTROABS 15.0*  --   --   --   --   --   --   --  6.3  --   --   HGB 11.7*   < > 7.9*  --    < > 7.8* 7.8*  --  8.0* 8.5* 8.6*  HCT 34.7*   < > 23.9*  --    < > 23.6* 23.6*  --  24.1* 26.6* 25.7*  MCV 111.6*   < > 114.4*  --   --   --  115.1*  --  115.9* 120.9* 114.2*  PLT 37*   < > 21*   < >  --   --  16* 16* 23* 22* 22*   < > = values in this interval not displayed.   Basic Metabolic Panel: Recent Labs  Lab 01/16/20 1226 01/17/20 0253 01/18/20 0041 01/18/20 0505 01/19/20 0203 01/20/20 0452 01/21/20 0432  NA 131*   < > 134* 135 135 135 136  K 4.8   < > 4.0 4.2 5.6* 4.6 4.6  CL 91*   < > 101 102 104 104 103  CO2 27   < > '25 25 22 '$ 21* 24  GLUCOSE 97   < > 120* 102* 120* 104* 97  BUN 45*   < > 36* 33* 30* 24* 26*  CREATININE 1.37*   < > 0.71 0.60* <0.30* 0.33* 0.33*  CALCIUM 8.4*   < > 8.2* 8.6* 8.7*  8.9 8.8*  MG 1.7  --   --   --   --   --   --    < > = values in this interval not displayed.   GFR: Estimated Creatinine Clearance: 120.9 mL/min (A) (by C-G formula based on SCr of 0.33 mg/dL (L)). Liver Function Tests: Recent Labs  Lab 01/18/20 0041 01/18/20 0505 01/19/20 0203 01/20/20 0452 01/21/20 0432  AST 111* 103* 136* 115* 96*  ALT 129* 119* 119* 132* 122*  ALKPHOS 116 108 96 106 120  BILITOT 25.0* 24.6* 25.7* 32.7* 41.8*  PROT 6.0* 6.2* 5.8* 5.9* 6.2*  ALBUMIN 3.8 4.0 3.3* 3.4* 3.4*   No results for input(s): LIPASE, AMYLASE in the last 168 hours. Recent Labs  Lab 01/20/20 1309  AMMONIA 76*   Coagulation Profile: Recent Labs  Lab 01/18/20 0840 01/18/20 1648 01/19/20 0203 01/20/20 0452 01/21/20 0432  INR 3.7* 3.6* 3.3* 3.1* 3.4*   Cardiac Enzymes: No results for input(s): CKTOTAL, CKMB, CKMBINDEX, TROPONINI in the last 168 hours. BNP (last 3 results) No results for input(s): PROBNP in the last 8760 hours. HbA1C: No results for input(s): HGBA1C in the last 72 hours. CBG: Recent Labs  Lab 01/16/20 1959 01/16/20 2344  GLUCAP 108*  121*   Lipid Profile: No results for input(s): CHOL, HDL, LDLCALC, TRIG, CHOLHDL, LDLDIRECT in the last 72 hours. Thyroid Function Tests: No results for input(s): TSH, T4TOTAL, FREET4, T3FREE, THYROIDAB in the last 72 hours. Anemia Panel: No results for input(s): VITAMINB12, FOLATE, FERRITIN, TIBC, IRON, RETICCTPCT in the last 72 hours. Urine analysis:    Component Value Date/Time   COLORURINE AMBER (A) 01/16/2020 1245   APPEARANCEUR HAZY (A) 01/16/2020 1245   LABSPEC 1.013 01/16/2020 1245   PHURINE 6.0 01/16/2020 1245   GLUCOSEU NEGATIVE 01/16/2020 1245   HGBUR MODERATE (A) 01/16/2020 1245   BILIRUBINUR MODERATE (A) 01/16/2020 1245   BILIRUBINUR large (A) 03/19/2016 1103   BILIRUBINUR small 12/25/2014 1001   KETONESUR NEGATIVE 01/16/2020 1245   PROTEINUR NEGATIVE 01/16/2020 1245   UROBILINOGEN 4.0 03/19/2016 1103    UROBILINOGEN 0.2 01/21/2015 0623   NITRITE NEGATIVE 01/16/2020 1245   LEUKOCYTESUR NEGATIVE 01/16/2020 1245     Loreena Valeri M.D. [facility] 01/21/2020, 12:51 PM   Call night coverage person covering after 7pm

## 2020-01-21 NOTE — Progress Notes (Signed)
Palliative Care Progress Note  Reason for visit: Goals of care in light of end-stage liver disease/decompensated alcoholic cirrhosis  I discussed Jesse Esparza's case with Dr. Tana Coast as well as GI team, including Dr. Hilarie Fredrickson and Nicoletta Ba.  I saw and examined [name] today.  He is awake and alert but has significant jaundice.  He has more congestion with his breathing today.  He is still mentating appropriately at this time and understands his situation well.  I discussed with Jesse Esparza and his family (1 daughter and sister in room and his other daughter via phone) regarding his clinical course and options for care moving forward.  He understands that there are no further interventions that are going to fix his underlying problem and the option we have moving forward is to continue to provide the best supportive care possible.    At this point, he desires to continue current interventions with hopes that his family will be able to visit under end-of-life visitation exemption.  I believe that this is certainly appropriate in this situation.  I also discussed with Dr. Tana Coast, Dr. Hilarie Fredrickson, and Nicoletta Ba from the GI service who agree that Jesse Esparza is at end-of-life.  While he is not currently "full comfort," the goal of our interventions at this point are supportive with the hope that we can continue to add as much time in quality to his life as possible with the understanding that he is going to decompensate at some point, likely in the next day or two.    His family inquired about hospice care and how it may fit into his care moving forward.  He agrees that when the time comes that he is no longer able to meaningfully interact with family, the shift of care would be to comfort measures only.  I discussed with Jesse Esparza and his family regarding plan for transition to residential hospice if he reaches the point where he is no longer awake enough to interact with family and he is stable enough to consider  transfer at that point.  They expressed agreement with this plan, however, noted that he does not have insurance or resources.  I assured them that if the time comes to transition to residential hospice, we would ensure that it is with a plan for charity care (I am certain one of our community hospice partners will be willing to accept him regardless of his financial situation) and not something that would cause undue financial burden on his family.  -DNR/DNI -Continue current interventions with plan to reassess his situation in another 24 to 48 hours.  His bilirubin continues to climb today and I suspect he may become encephalopathic/somnolent over this time period.  He would like to continue with current interventions at this time as he is not yet confused and desires to see his family before his death.   -Appreciate nursing staff/administration working to evaluate and allow visitation exemption per end-of-life visitor policy.  I certainly feel this is most appropriate in his case. -When the time comes that he becomes encephalopathic and is no longer able to interact meaningfully with family, we discussed plan for transition to comfort care and assessment for possible transition to residential hospice if he appears stable enough to consider discharge.  Total time: 50 minutes  Greater than 50%  of this time was spent counseling and coordinating care related to the above assessment and plan.  Micheline Rough, MD [place] Team [phone]

## 2020-01-21 NOTE — Progress Notes (Signed)
Palliative care progress note  Reason for visit: Goals of care in light of end-stage liver disease from alcoholic cirrhosis  I saw and examined Jesse Esparza today.  His daughter, Jesse Esparza, was also present at the bedside.  He reports that he continues to be depressed when thinking about the future and the fact that he has end-stage liver disease and is at high risk for decompensation and death at any point in the future.  He tells me that he is feeling like he is a little bit more confused today than yesterday.  When asked to further describe this, he states that he feels like sometimes he is just "foggy."  We talked about things most important to Jesse Esparza.  He states that his family (including his parents, 2 daughters, and grandchildren) is the most important thing to him.  He wants to live as long as possible as long as he is not in pain or suffering.  We discussed options for care moving forward in light of his end-stage liver disease.  We reviewed his clinical course this admission and the fact that he has multiple problems going on at this time including infection, liver failure, kidney failure (improved currently), and low platelets.  Discussed that his options moving forward include continuation of current interventions and supportive care to see how his body continues to respond versus refocusing care to only things for his comfort.  We discussed the burdens and benefits of each of these including continued hospitalization would be the most likely way to add time to his life but comes with burdens of being hospitalized with limited access to visitors versus transitioning to someplace like a residential hospice which would allow more free visitation but would result in very quick decline and likely death in the next few days to a couple of weeks.  He states he is not at a point where he wants to consider transition to hospice care at this point in time, but he is also very open and clear that he  understands this may very well be a terminal admission.  His daughter reports that they have been discussing and Jesse Esparza is at a point where he feels that he needs to try to gain closure in his personal relationships with family.  She states that this is their cultural way of preparing for death.  She requests consideration for visitation exceptions due to the fact that there is high likelihood Jesse Esparza is going to decompensate and die and he would like to be able to see family while he is still awake enough to meaningfully interact with them.  There is a great deal of concern based on the fact that he does appear to be getting more confused and is certainly at risk for worsening hepatic encephalopathy at any point in time.  -DNR/DNI -Working to determine what is the best care plan for Jesse Esparza in light of his end-stage liver disease.  He is certainly at risk for decompensation and death at any point moving forward.  Well kidney function has improved, his bilirubin is up today from yesterday.  Overall his prognosis remains dismal. -Will discuss further with the rest of his care team to try and determine to her best ability where we are at as far as his prognosis.  Although plan moving forward is not for comfort care, I do believe that it would be reasonable to consider allowing end-of-life visitor exceptions for Jesse Esparza based upon the fact that he does have very  high likelihood that this will be a terminal admission.  Total time: 45 minutes Greater than 50%  of this time was spent counseling and coordinating care related to the above assessment and plan.  Micheline Rough, MD [place] Team [phone]

## 2020-01-22 LAB — PROTIME-INR
INR: 3.6 — ABNORMAL HIGH (ref 0.8–1.2)
Prothrombin Time: 35 seconds — ABNORMAL HIGH (ref 11.4–15.2)

## 2020-01-22 LAB — COMPREHENSIVE METABOLIC PANEL
ALT: 109 U/L — ABNORMAL HIGH (ref 0–44)
AST: 96 U/L — ABNORMAL HIGH (ref 15–41)
Albumin: 3.2 g/dL — ABNORMAL LOW (ref 3.5–5.0)
Alkaline Phosphatase: 110 U/L (ref 38–126)
Anion gap: 9 (ref 5–15)
BUN: 29 mg/dL — ABNORMAL HIGH (ref 6–20)
CO2: 22 mmol/L (ref 22–32)
Calcium: 8.6 mg/dL — ABNORMAL LOW (ref 8.9–10.3)
Chloride: 104 mmol/L (ref 98–111)
Creatinine, Ser: 0.54 mg/dL — ABNORMAL LOW (ref 0.61–1.24)
GFR calc Af Amer: >60 mL/min (ref >60)
GFR calc non Af Amer: >60 mL/min (ref >60)
Glucose, Bld: 94 mg/dL (ref 70–99)
Potassium: 4.4 mmol/L (ref 3.5–5.1)
Sodium: 135 mmol/L (ref 135–145)
Total Bilirubin: 29.5 mg/dL (ref 0.3–1.2)
Total Protein: 5.9 g/dL — ABNORMAL LOW (ref 6.5–8.1)

## 2020-01-22 LAB — CBC
HCT: 26.9 % — ABNORMAL LOW (ref 39.0–52.0)
Hemoglobin: 8.9 g/dL — ABNORMAL LOW (ref 13.0–17.0)
MCH: 38.9 pg — ABNORMAL HIGH (ref 26.0–34.0)
MCHC: 33.1 g/dL (ref 30.0–36.0)
MCV: 117.5 fL — ABNORMAL HIGH (ref 80.0–100.0)
Platelets: 21 10*3/uL — CL (ref 150–400)
RBC: 2.29 MIL/uL — ABNORMAL LOW (ref 4.22–5.81)
RDW: 17.1 % — ABNORMAL HIGH (ref 11.5–15.5)
WBC: 12.4 10*3/uL — ABNORMAL HIGH (ref 4.0–10.5)
nRBC: 0 % (ref 0.0–0.2)

## 2020-01-22 MED ORDER — LACTULOSE 20 G PO PACK: 20.0000 g | PACK | Freq: Three times a day (TID) | ORAL | 0 refills | Status: AC

## 2020-01-22 MED ORDER — IPRATROPIUM-ALBUTEROL 0.5-2.5 (3) MG/3ML IN SOLN: 3.0000 mL | Freq: Four times a day (QID) | RESPIRATORY_TRACT | Status: AC | PRN

## 2020-01-22 MED ORDER — FUROSEMIDE 20 MG PO TABS: 20.0000 mg | ORAL_TABLET | Freq: Every day | ORAL | 0 refills | Status: AC

## 2020-01-22 MED FILL — FUROSEMIDE 20 MG TABS: 20 | 30 days supply | Qty: 30 | Fill #0

## 2020-01-22 NOTE — Discharge Summary (Signed)
Physician Discharge Summary   [name] MRN: [id number] DOB/AGE: 1966/03/06 55 y.o.  Admit date: 01/16/2020 Discharge date: 01/22/2020  Primary Care Physician:  Charlott Rakes, MD   Recommendations for Outpatient Follow-up:  1. Patient being discharged to residential [place]: Residential hospice Equipment/Devices:   Discharge Condition: Overall poor prognosis CODE STATUS: DNR Diet recommendation: Heart healthy diet   Discharge Diagnoses:    . Severe sepsis ([place]) . Alcoholic hepatitis with ascites . Pneumatosis intestinalis . E coli bacteremia . 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 AKI (acute kidney injury) ([place]) . Hyperkalemia . E. coli UTI . Thrombocytopenia, chronic . 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 Ardis Hughs Palliative medicine, Dr. Domingo Cocking    Allergies:  No Known Allergies   DISCHARGE MEDICATIONS: Allergies as of 01/22/2020   No Known Allergies     Medication List    STOP taking these medications   prednisoLONE 5 MG Tabs tablet   spironolactone 100 MG tablet Commonly known as: ALDACTONE     TAKE these medications   furosemide 20 MG tablet Commonly known as: LASIX Take 1 tablet (20 mg total) by mouth daily. What changed:   medication strength  how much to take   ipratropium-albuterol 0.5-2.5 (3) MG/3ML Soln Commonly known as: DUONEB Take 3 mLs by nebulization every 6 (six) hours as needed.   lactulose 20 g packet Commonly known as: CEPHULAC Take 1 packet (20 g total) by mouth 3 (three) times daily. What changed: when to take this   pantoprazole 40 MG tablet Commonly known as: PROTONIX Take 1 tablet (40 mg total) by mouth 2 (two) times daily before a meal.   rifaximin 550 MG Tabs tablet Commonly known as: XIFAXAN Take 1 tablet (550 mg total) by mouth 2 (two) times daily.   thiamine 100 MG  tablet Take 1 tablet (100 mg total) by mouth daily.        Brief H and P: For complete details please refer to admission H and P, but in brief Admit date:01/16/2020 54 y.o.malewith alcoholic cirrhosis, GERD, hyperlipidemia, hypertension, obesity who was recently hospitalized from 5/4/-01/11/2020 for acute alcoholic hepatitis, underwent paracentesis on 5/5 with drainage of 1.4 L of yellow ascitic fluid, also had EGD on 5/14 subsequently discharged on twice daily Protonix; called GI office on 5/19 c/o crampy RLQ abdominal pain 4/10, feeling very weak, dizzy with some subjective fever and chills.He was advised to come to the emergency department.  ED Course: Afebrile,tachycardic, tachypneic and hypotensive with blood pressure of 89/61. He was given a liter of IV fluid with not much improvement in his blood pressure. 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 Elevated lactic acid. Elevated creatinine of 1.37. Worsened hyperbilirubinemia as well as LFTs. Abdominal x-ray was done which was unremarkable.  Hospital course:Patient underwent CT abd showing benign pneumatosis in the ascending colon, witha few adjacent foci of extraluminal free air, suspected small amount ofmesenteric venous gas  and free air in the absence ofbowel perforation. Cholelithiasis,Cirrhosis with portal hypertension, splenomegaly, and smallvolume abdominopelvic ascites.  Patient was admitted for further work-up.   Hospital Course:   Severe sepsis, E. coli bacteremia, E. coli UTI -Patient met sepsis criteria at the time of admission based on leukocytosis, tachycardia, tachypnea, lactic acidosis. -CT abdomen on admission showed benign pneumatosis in the ascending colon with few adjacent foci of extraluminal free  air, cirrhosis with portal hypertension -Patient was placed on empiric IV Rocephin for  subacute bacterial peritonitis on admission -Blood cultures, urine cultures were positive for E. coli, sensitive to IV Rocephin -GI and infectious disease were consulted. Rocephin and oral Flagyl (added on 5/22 per ID recommendations discontinued on 5/24).   -Per ID, patient is completing 7 days of IV antibiotics for E. coli bacteremia today, does not need any further antibiotics at discharge  Chronic alcoholic hepatitis with cirrhosis, decompensated, intra-abdominal infection -Recent admission with alcoholic hepatitis, DM, total bilirubin plateaued at 41.8 on 01/21/2020 (<--25.7 on 5/22),  -Patient was placed on home dose of prednisolone, lactulose, rifaximin.  Prednisone was discontinued on 01/19/2020 due to sepsis and bacteremia -Continue lactulose, rifaximin -Continue oral Lasix 20 mg daily -Overall poor prognosis with ESLD, worsening symptoms, hyperbilirubinemia -GI and palliative medicine had a thorough discussion with the patient and his family, he continues to feel poorly with progressive decline in his liver function.  His encephalopathy remains controlled and mentating with lactulose and rifaximin.  Overall, poor prognosis with ESLD. Patient and family requested residential hospice   Chronic thrombocytopenia, acute on chronic microcytic anemia, chronic GI blood loss -Recent EGD previous admission on 5/14-portal hypertensive gastropathy, grade 1 varices, no active bleeding - received 1 unit platelets pheresis on 5/21, received vitamin K 10 mg p.o. daily for 3 days -INR trending up 3.6, platelets trending down 21K    Acute kidney injury -Creatinine 1.3 at the time of admission was 1.0 at the time of discharge on 5/14 -Creatinine function stable 0.5, continue low-dose Lasix  Hyperkalemia -Resolved  Leukocytosis -Resolved, secondary to sepsis versus chronic prednisone use  Dizziness -Patient has been complaining of dizziness since the previous admission.  States he feels  debilitated and unable to walk due to dizziness and acute medical issues. -2D echo 01/02/2020 had shown EF 70 to 75% with grade 1 diastolic dysfunction, no aortic stenosis - No focal neurological deficits, no vertigo   Day of Discharge S: Sitting up in the chair, in better spirits as his family has been visiting.  No acute complaints, daughter and niece at the bedside  BP 116/63 (BP Location: Right Arm)   Pulse (!) 102   Temp 98 F (36.7 C) (Oral)   Resp 20   Ht '5\' 5"'$  (1.651 m)   Wt 112.6 kg   SpO2 100%   BMI 41.32 kg/m   Physical Exam: General: Alert and awake oriented x3 not in any acute distress.  Jaundiced, scleral icterus HEENT: anicteric sclera, pupils reactive to light and accommodation CVS: S1-S2 clear no murmur rubs or gallops Chest: clear to auscultation bilaterally, no wheezing rales or rhonchi Abdomen: soft nontender, distended Extremities: 2+ pitting edema Neuro: No neuro deficits    Get Medicines reviewed and adjusted: Please take all your medications with you for your next visit with your Primary MD  Please request your Primary MD to go over all hospital tests and procedure/radiological results at the follow up. Please ask your Primary MD to get all Hospital records sent to his/her office.  If you experience worsening of your admission symptoms, develop shortness of breath, life threatening emergency, suicidal or homicidal thoughts you must seek medical attention immediately by calling 911 or calling your MD immediately  if symptoms less severe.  You must read complete instructions/literature along with all the possible adverse reactions/side effects for all the Medicines you take and that have been prescribed to you. Take any new Medicines after you have completely  understood and accept all the possible adverse reactions/side effects.   Do not drive when taking pain medications.   Do not take more than prescribed Pain, Sleep and Anxiety Medications  Special  Instructions: If you have smoked or chewed Tobacco  in the last 2 yrs please stop smoking, stop any regular Alcohol  and or any Recreational drug use.  Wear Seat belts while driving.  Please note  You were cared for by a hospitalist during your hospital stay. Once you are discharged, your primary care physician will handle any further medical issues. Please note that NO REFILLS for any discharge medications will be authorized once you are discharged, as it is imperative that you return to your primary care physician (or establish a relationship with a primary care physician if you do not have one) for your aftercare needs so that they can reassess your need for medications and monitor your lab values.   The results of significant diagnostics from this hospitalization (including imaging, microbiology, ancillary and laboratory) are listed below for reference.      Procedures/Studies:  CT ABDOMEN PELVIS WO CONTRAST  Result Date: 01/16/2020 CLINICAL DATA:  Abdominal pain and distension. History of cirrhosis. EXAM: CT ABDOMEN AND PELVIS WITHOUT CONTRAST TECHNIQUE: Multidetector CT imaging of the abdomen and pelvis was performed following the standard protocol without IV contrast. COMPARISON:  Most recent CT fifteen days ago 01/01/2020. FINDINGS: Lower chest: Trace right pleural effusion/thickening. Rounded density in the lower most right lung base is new from prior exam and likely atelectasis, series 6, image 15. Hepatobiliary: Cirrhotic hepatic morphology with nodular contours. No obvious focal lesion on noncontrast exam. Small gallstones within the gallbladder. No biliary dilatation. Pancreas: No ductal dilatation or inflammation. Spleen: Splenomegaly with spleen measuring 14 x 6.8 x 14.6 cm (volume = 730 cm^3). Findings are grossly stable allowing for differences in caliper placement. Adrenals/Urinary Tract: Minimal thickening of the right adrenal gland without dominant nodule. No hydronephrosis. No  renal or ureteral calculi. Urinary bladder is physiologically distended. Stomach/Bowel: Stomach is unremarkable. Administered enteric contrast reaches the mid distal small bowel. Mild wall thickening of the duodenum and proximal small bowel loops. No obstruction. Portions of normal appendix are visualized, partially obscured by adjacent ascites. There is mottled air in the ascending colon suspicious for pneumatosis, with a few adjacent foci of extraluminal air, for example series 2, image 48 and 47. Small focus of air centrally may be within the mesenteric veins versus mesenteric fat, series 2, image 46. there is no definite associated colonic wall thickening or pericolonic edema. Formed stool throughout the more distal colon with scattered diverticula. No focal diverticulitis. Vascular/Lymphatic: Aortic atherosclerosis. Perisplenic collaterals with splenorenal shunting were better appreciated on prior contrast-enhanced exam. Tiny focus of air in the right mesentery which may be intravascular versus in the mesenteric fat, series 2, image 46. possible additional tiny mesenteric venous focus in the central abdomen, series 2, image 37. Prominent periportal lymph nodes which are likely reactive. Reproductive: Stable prostate gland. Other: Small volume abdominopelvic ascites, greatest volume in the right upper and lower quadrants. Tiny foci of free air adjacent to the ascending colon in the right abdomen. No evidence of intra-abdominal abscess. There is a small fat containing umbilical hernia. Musculoskeletal: Degenerative disc disease and facet hypertrophy of the lumbar spine. There are no acute or suspicious osseous abnormalities. IMPRESSION: 1. Findings suspicious for pneumatosis in the ascending colon, with a few adjacent foci of extraluminal free air. Small amount of mesenteric venous gas is suspected  in the region of pneumatosis. However, there is no significant colonic wall thickening or inflammatory change in the  region of pneumatosis and extraluminal air. This may be benign pneumatosis, and free air in the absence of bowel perforation has been described, however this is indeterminate on imaging. Close clinical follow-up is recommended. 2. Mild wall thickening of the duodenum and proximal small bowel loops, likely reactive. 3. Cirrhosis with portal hypertension, splenomegaly, and small volume abdominopelvic ascites. 4. Cholelithiasis. Aortic Atherosclerosis (ICD10-I70.0). These results were called by telephone at the time of interpretation on 01/16/2020 at 5:34 pm to Dr Darliss Cheney , who verbally acknowledged these results. Electronically Signed   By: Keith Rake M.D.   On: 01/16/2020 17:37   DG Chest 2 View  Result Date: 01/08/2020 CLINICAL DATA:  Chest pain.  Pleural effusion. EXAM: CHEST - 2 VIEW COMPARISON:  05/23/2017; CT abdomen pelvis-01/01/2020 FINDINGS: Grossly unchanged borderline enlarged cardiac silhouette and mediastinal contours with mild tortuosity and potential ectasia of the thoracic aorta. Grossly unchanged minimal left basilar/retrocardiac heterogeneous opacities favored to represent atelectasis. No new focal airspace opacities. No pleural effusion or pneumothorax. No evidence of edema. No acute osseous abnormalities. Stigmata of dish within the thoracic spine. IMPRESSION: Similar findings of borderline cardiomegaly and left basilar atelectasis/scar without superimposed acute cardiopulmonary disease. Specifically, no evidence of pleural effusion. Electronically Signed   By: Sandi Mariscal M.D.   On: 01/08/2020 17:12   CT ABDOMEN PELVIS W CONTRAST  Result Date: 01/01/2020 CLINICAL DATA:  Cirrhosis, weight gain, abdominal distension, leg swelling, jaundice, history GERD, hypertension EXAM: CT ABDOMEN AND PELVIS WITH CONTRAST TECHNIQUE: Multidetector CT imaging of the abdomen and pelvis was performed using the standard protocol following bolus administration of intravenous contrast. Sagittal and  coronal MPR images reconstructed from axial data set. CONTRAST:  153m OMNIPAQUE IOHEXOL 300 MG/ML SOLN IV. No oral contrast. COMPARISON:  04/05/2016 FINDINGS: Lower chest: Subsegmental atelectasis RIGHT lower lobe. Tiny RIGHT pleural effusion. Hepatobiliary: Dependent calculi within gallbladder. Gallbladder wall appears thickened, nonspecific in the setting of ascites. Heterogeneous cirrhotic appearing liver without discrete mass. No intrahepatic or extrahepatic biliary dilatation. Pancreas: Normal appearance Spleen: Mildly enlarged, 14.6 x 13.6 x 5.3 cm (volume = 550 cm^3). No focal mass. Adrenals/Urinary Tract: Adrenal glands, kidneys, ureters, and bladder normal appearance Stomach/Bowel: Normal appendix. Stomach and bowel loops normal appearance Vascular/Lymphatic: Aorta normal caliber with mild scattered atherosclerotic calcifications. Vascular structures patent. Large perisplenic collateral extending to LEFT renal vein consistent with spontaneous splenorenal shunt. Major vascular structures patent. No adenopathy. Reproductive: Unremarkable seminal vesicles. Prostate gland minimally enlarged at 4.8 x 3.5 cm image 96. Other: Moderate ascites. No free air. No hernia. Scattered edema/infiltration of tissue planes in abdomen. 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 Moderate ascites. Cholelithiasis. Minimal prostatic enlargement. Electronically Signed   By: MLavonia DanaM.D.   On: 01/01/2020 12:54   [place]domen Limited  Result Date: 01/16/2020 CLINICAL DATA:  History of cirrhosis. Please perform ascites search ultrasound and ultrasound-guided paracentesis as indicated. EXAM: LIMITED ABDOMEN ULTRASOUND FOR ASCITES TECHNIQUE: Limited ultrasound survey for ascites was performed in all four abdominal quadrants. COMPARISON:  Ultrasound-guided paracentesis-01/08/2020 (yielding 1.2 L peritoneal fluid) FINDINGS: Sonographic evaluation of the abdomen  demonstrates a trace amount intra-abdominal ascites within the bilateral lower abdominal quadrants, too small to allow for safe ultrasound-guided paracentesis. IMPRESSION: Trace amount of intra-abdominal ascites, too small to allow for safe ultrasound-guided paracentesis. No paracentesis attempted. Electronically Signed   By: JEldridge AbrahamsD.  On: 01/16/2020 16:55   US Paracentesis  Result Date: 01/08/2020 INDICATION: Alcoholic hepatitis and decompensated cirrhosis. Recurrent ascites. Request for therapeutic paracentesis. EXAM: ULTRASOUND GUIDED RIGHT LOWER QUADRANT PARACENTESIS MEDICATIONS: None. COMPLICATIONS: None immediate. PROCEDURE: Informed written consent was obtained from the patient after a discussion of the risks, benefits and alternatives to treatment. A timeout was performed prior to the initiation of the procedure. Initial ultrasound scanning demonstrates a small to moderate amount of ascites within the right lower abdominal quadrant. The right lower abdomen was prepped and draped in the usual sterile fashion. 1% lidocaine was used for local anesthesia. Following this, a 19 gauge, 10-cm, Yueh catheter was introduced. An ultrasound image was saved for documentation purposes. The paracentesis was performed. The catheter was removed and a dressing was applied. The patient tolerated the procedure well without immediate post procedural complication. FINDINGS: A total of approximately 1.2 L of clear, bright yellow fluid was removed. IMPRESSION: Successful ultrasound-guided paracentesis yielding 1.2 liters of peritoneal fluid. Read by: Ascencion Dike PA-C Electronically Signed   By: Sandi Mariscal M.D.   On: 01/08/2020 16:11   US Paracentesis  Result Date: 01/02/2020 INDICATION: Patient with history of acute alcoholic hepatitis/chronic alcoholic cirrhosis, ascites; request received for diagnostic and therapeutic paracentesis. EXAM: ULTRASOUND GUIDED DIAGNOSTIC AND THERAPEUTIC PARACENTESIS MEDICATIONS:  None COMPLICATIONS: None immediate. PROCEDURE: Informed written consent was obtained from the patient after a discussion of the risks, benefits and alternatives to treatment. A timeout was performed prior to the initiation of the procedure. Initial ultrasound scanning demonstrates a small amount of ascites within the right lower abdominal quadrant. The right lower abdomen was prepped and draped in the usual sterile fashion. 1% lidocaine was used for local anesthesia. Following this, a 19 gauge, 10-cm, Yueh catheter was introduced. An ultrasound image was saved for documentation purposes. The paracentesis was performed. The catheter was removed and a dressing was applied. The patient tolerated the procedure well without immediate post procedural complication. FINDINGS: A total of approximately 1.4 liters of clear, golden yellow fluid was removed. Samples were sent to the laboratory as requested by the clinical team. IMPRESSION: Successful ultrasound-guided diagnostic and therapeutic paracentesis yielding 1.4 liters of peritoneal fluid. Read by: Rowe Robert, PA-C Electronically Signed   By: Corrie Mckusick D.O.   On: 01/02/2020 12:44   DG CHEST PORT 1 VIEW  Result Date: 01/21/2020 CLINICAL DATA:  Shortness of breath EXAM: PORTABLE CHEST 1 VIEW COMPARISON:  01/16/2020 FINDINGS: Heart is at the upper limits of normal in size. The lungs are clear bilaterally. No acute bony abnormality is noted. IMPRESSION: No active disease. Electronically Signed   By: Inez Catalina M.D.   On: 01/21/2020 09:57   DG Chest Port 1 View  Result Date: 01/16/2020 CLINICAL DATA:  Weak. Additional provided: Patient reports weakness, abdominal distension that is more than usual, intermittent dizziness when standing, history of liver failure. EXAM: PORTABLE CHEST 1 VIEW COMPARISON:  Prior chest radiograph 01/08/2020 FINDINGS: Borderline cardiomegaly, unchanged. No appreciable airspace consolidation within the lungs. Redemonstrated mild  scarring and/or atelectasis within the left lung base. No evidence of pleural effusion or pneumothorax. No acute bony abnormality identified. IMPRESSION: No evidence of acute cardiopulmonary abnormality. Borderline cardiomegaly, unchanged. Redemonstrated mild scarring and/or atelectasis within the left lung base. Electronically Signed   By: Kellie Simmering DO   On: 01/16/2020 13:27   ECHOCARDIOGRAM COMPLETE  Result Date: 01/02/2020    ECHOCARDIOGRAM REPORT   Patient Name:   ROCKIE SCHNOOR Date of Exam: 01/02/2020 Medical Rec #:  [id number]       Height:       65.0 in Accession #:    [id number]      Weight:       254.1 lb Date of Birth:  12-27-1965      BSA:          2.190 m Patient Age:    78 years        BP:           111/67 mmHg Patient Gender: M               HR:           93 bpm. Exam Location:  Inpatient Procedure: 2D Echo, Cardiac Doppler and Color Doppler Indications:    R94.31 Abnormal EKG  History:        Patient has no prior history of Echocardiogram examinations.                 Risk Factors:Hypertension. ETOH. Edema. Ascites.  Sonographer:    Roseanna Rainbow RDCS Referring Phys: [id number] [place] Comments: Suboptimal parasternal window, no subcostal window, Technically difficult study due to poor echo windows and patient is morbidly obese. Image acquisition challenging due to patient body habitus. Study is to rule out right heart failure. IMPRESSIONS  1. Left ventricular ejection fraction, by estimation, is 70 to 75%. The left ventricle has hyperdynamic function. The left ventricle has no regional wall motion abnormalities. There is moderate left ventricular hypertrophy. Left ventricular diastolic parameters are consistent with Grade I diastolic dysfunction (impaired relaxation).  2. Right ventricular systolic function is normal. The right ventricular size is normal.  3. The mitral valve is normal in structure. No evidence of mitral valve regurgitation. No evidence of mitral stenosis.   4. The aortic valve was not well visualized. Aortic valve regurgitation is not visualized. No aortic stenosis is present.  5. The inferior vena cava is normal in size with greater than 50% respiratory variability, suggesting right atrial pressure of 3 mmHg. FINDINGS  Left Ventricle: Left ventricular ejection fraction, by estimation, is 70 to 75%. The left ventricle has hyperdynamic function. The left ventricle has no regional wall motion abnormalities. The left ventricular internal cavity size was normal in size. There is moderate left ventricular hypertrophy. Left ventricular diastolic parameters are consistent with Grade I diastolic dysfunction (impaired relaxation). Right Ventricle: The right ventricular size is normal. No increase in right ventricular wall thickness. Right ventricular systolic function is normal. Left Atrium: Left atrial size was normal in size. Right Atrium: Right atrial size was normal in size. Pericardium: There is no evidence of pericardial effusion. Mitral Valve: The mitral valve is normal in structure. Normal mobility of the mitral valve leaflets. No evidence of mitral valve regurgitation. No evidence of mitral valve stenosis. Tricuspid Valve: The tricuspid valve is normal in structure. Tricuspid valve regurgitation is not demonstrated. No evidence of tricuspid stenosis. Aortic Valve: The aortic valve was not well visualized. Aortic valve regurgitation is not visualized. No aortic stenosis is present. Pulmonic Valve: The pulmonic valve was normal in structure. Pulmonic valve regurgitation is not visualized. No evidence of pulmonic stenosis. Aorta: The aortic root is normal in size and structure. Venous: The inferior vena cava is normal in size with greater than 50% respiratory variability, suggesting right atrial pressure of 3 mmHg. IAS/Shunts: No atrial level shunt detected by color flow Doppler.  LEFT VENTRICLE PLAX 2D LVIDd:         4.08 cm  Diastology LVIDs:         2.54 cm     LV  e' lateral:   9.79 cm/s LV PW:         1.46 cm     LV E/e' lateral: 9.0 LV IVS:        1.33 cm     LV e' medial:    7.40 cm/s LVOT diam:     2.10 cm     LV E/e' medial:  11.9 LV SV:         80 LV SV Index:   37 LVOT Area:     3.46 cm  LV Volumes (MOD) LV vol d, MOD A2C: 48.6 ml LV vol d, MOD A4C: 68.5 ml LV vol s, MOD A2C: 11.8 ml LV vol s, MOD A4C: 17.0 ml LV SV MOD A2C:     36.8 ml LV SV MOD A4C:     68.5 ml LV SV MOD BP:      45.2 ml RIGHT VENTRICLE RV S prime:     13.40 cm/s TAPSE (M-mode): 2.0 cm LEFT ATRIUM             Index       RIGHT ATRIUM           Index LA diam:        3.70 cm 1.69 cm/m  RA Area:     12.50 cm LA Vol (A2C):   31.2 ml 14.25 ml/m RA Volume:   20.40 ml  9.32 ml/m LA Vol (A4C):   46.5 ml 21.23 ml/m LA Biplane Vol: 40.8 ml 18.63 ml/m  AORTIC VALVE LVOT Vmax:   134.00 cm/s LVOT Vmean:  96.000 cm/s LVOT VTI:    0.231 m  AORTA Ao Root diam: 3.30 cm Ao Asc diam:  3.20 cm MITRAL VALVE MV Area (PHT): 3.31 cm     SHUNTS MV Decel Time: 229 msec     Systemic VTI:  0.23 m MV E velocity: 87.80 cm/s   Systemic Diam: 2.10 cm MV A velocity: 123.00 cm/s MV E/A ratio:  0.71 Candee Furbish MD Electronically signed by Candee Furbish MD Signature Date/Time: 01/02/2020/11:01:13 AM    Final        LAB RESULTS: Basic Metabolic Panel: Recent Labs  Lab 01/16/20 1226 01/17/20 0253 01/21/20 0432 01/22/20 0514  NA 131*   < > 136 135  K 4.8   < > 4.6 4.4  CL 91*   < > 103 104  CO2 27   < > 24 22  GLUCOSE 97   < > 97 94  BUN 45*   < > 26* 29*  CREATININE 1.37*   < > 0.33* 0.54*  CALCIUM 8.4*   < > 8.8* 8.6*  MG 1.7  --   --   --    < > = values in this interval not displayed.   Liver Function Tests: Recent Labs  Lab 01/21/20 0432 01/22/20 0514  AST 96* 96*  ALT 122* 109*  ALKPHOS 120 110  BILITOT 41.8* 29.5*  PROT 6.2* 5.9*  ALBUMIN 3.4* 3.2*   No results for input(s): LIPASE, AMYLASE in the last 168 hours. Recent Labs  Lab 01/20/20 1309  AMMONIA 76*   CBC: Recent Labs  Lab  01/19/20 0203 01/20/20 0452 01/21/20 0432 01/21/20 0432 01/22/20 0514  WBC 7.9   < > 9.3  --  12.4*  NEUTROABS 6.3  --   --   --   --   HGB  8.0*   < > 8.6*  --  8.9*  HCT 24.1*   < > 25.7*  --  26.9*  MCV 115.9*   < > 114.2*   < > 117.5*  PLT 23*   < > 22*  --  21*   < > = values in this interval not displayed.   Cardiac Enzymes: No results for input(s): CKTOTAL, CKMB, CKMBINDEX, TROPONINI in the last 168 hours. BNP: Invalid input(s): POCBNP CBG: Recent Labs  Lab 01/16/20 1959 01/16/20 2344  GLUCAP 108* 121*       Disposition and Follow-up: Discharge Instructions    Diet - low sodium heart healthy   Complete by: As directed    Increase activity slowly   Complete by: As directed        DISPOSITION: Residential hospice   DISCHARGE FOLLOW-UP Follow-up Information    Charlott Rakes, MD. Schedule an appointment as soon as possible for a visit in 2 week(s).   Specialty: Family Medicine Contact information: [place] [address] [phone]            Time coordinating discharge:  40-minutes  Signed:   Estill Cotta M.D. [facility] 01/22/2020, 1:44 PM

## 2020-01-22 NOTE — TOC Progression Note (Signed)
Transition of Care [facility]) - Progression Note    Patient Details  Name: Adaryll Banta MRN: [id number] Date of Birth: [birth year]  [facility]) CM/SW Contact  Joaquin Courts, RN Phone Number: 01/22/2020, 3:52 PM  Clinical Narrative:    CM confirmed bed availability at [facility] today.  [facility] transportation has been arranged.   Expected Discharge Plan: [place] Barriers to Discharge: No Barriers Identified  Expected Discharge Plan and Services Expected Discharge Plan: [place]   Discharge Planning Services: CM Consult Post Acute Care Choice: Hospice Living arrangements for the past 2 months: Single Family Home Expected Discharge Date: 01/22/20                                     Social Determinants of Health (SDOH) Interventions    Readmission Risk Interventions Readmission Risk Prevention Plan 01/22/2020 01/21/2020  Transportation Screening - Complete  PCP or Specialist Appt within 3-5 Days Not Complete -  Not Complete comments discharge to [facility] -  [place] or [place] Complete -  Social Work Consult for [place] Planning/Counseling Complete -  Palliative Care Screening - Complete  Medication Review Press photographer) - Complete  Some recent data might be hidden

## 2020-01-22 NOTE — TOC Progression Note (Signed)
Transition of Care [facility]) - Progression Note    Patient Details  Name: Jesse Esparza MRN: [id number] Date of Birth: 1966-07-09  Transition of Care [facility]) CM/SW Contact  Joaquin Courts, RN Phone Number: 01/22/2020, 11:33 AM  Clinical Narrative:    Patient and family select [facility] for residential hospice.  [facility] rep given referral.  Rep to notify CM if bed is available.     Expected Discharge Plan: [place] Barriers to Discharge: Hospice Bed not available  Expected Discharge Plan and Services Expected Discharge Plan: [place]   Discharge Planning Services: CM Consult Post Acute Care Choice: Hospice Living arrangements for the past 2 months: Single Family Home                                       Social Determinants of Health (SDOH) Interventions    Readmission Risk Interventions Readmission Risk Prevention Plan 01/21/2020  Transportation Screening Complete  Palliative Care Screening Complete  Medication Review (RN Care Manager) Complete  Some recent data might be hidden

## 2020-01-22 NOTE — Progress Notes (Signed)
Report called to [facility] at [facility].

## 2020-01-22 NOTE — Progress Notes (Signed)
Engineer, maintenance [facility]) Hospital Liaison note.     Received request from [place] Nancy Marus, RN for family interest in [facility]. Chart reviewed and eligibility confirmed. Spoke with family to confirm interest and explain services. Family agreeable to transfer today. TOC aware.     [facility] will notify TOC when registration paperwork has been completed to arrange transport.   RN please call report to [phone].   Thank you,     Farrel Gordon, RN, CCM       [place] (listed on [place] under [facility])     [phone]

## 2020-01-29 ENCOUNTER — Ambulatory Visit: Payer: Self-pay | Admitting: Gastroenterology

## 2020-02-14 ENCOUNTER — Ambulatory Visit: Payer: Self-pay | Admitting: Gastroenterology

## 2020-05-06 NOTE — Telephone Encounter (Signed)
Created In error

## 2022-02-09 IMAGING — DX DG CHEST 1V PORT
1 series · 1 of 1 positions shown · non-contrast
Comparison: 01/16/2020

CLINICAL DATA: Shortness of breath

EXAM:
PORTABLE CHEST 1 VIEW

[chest ap]
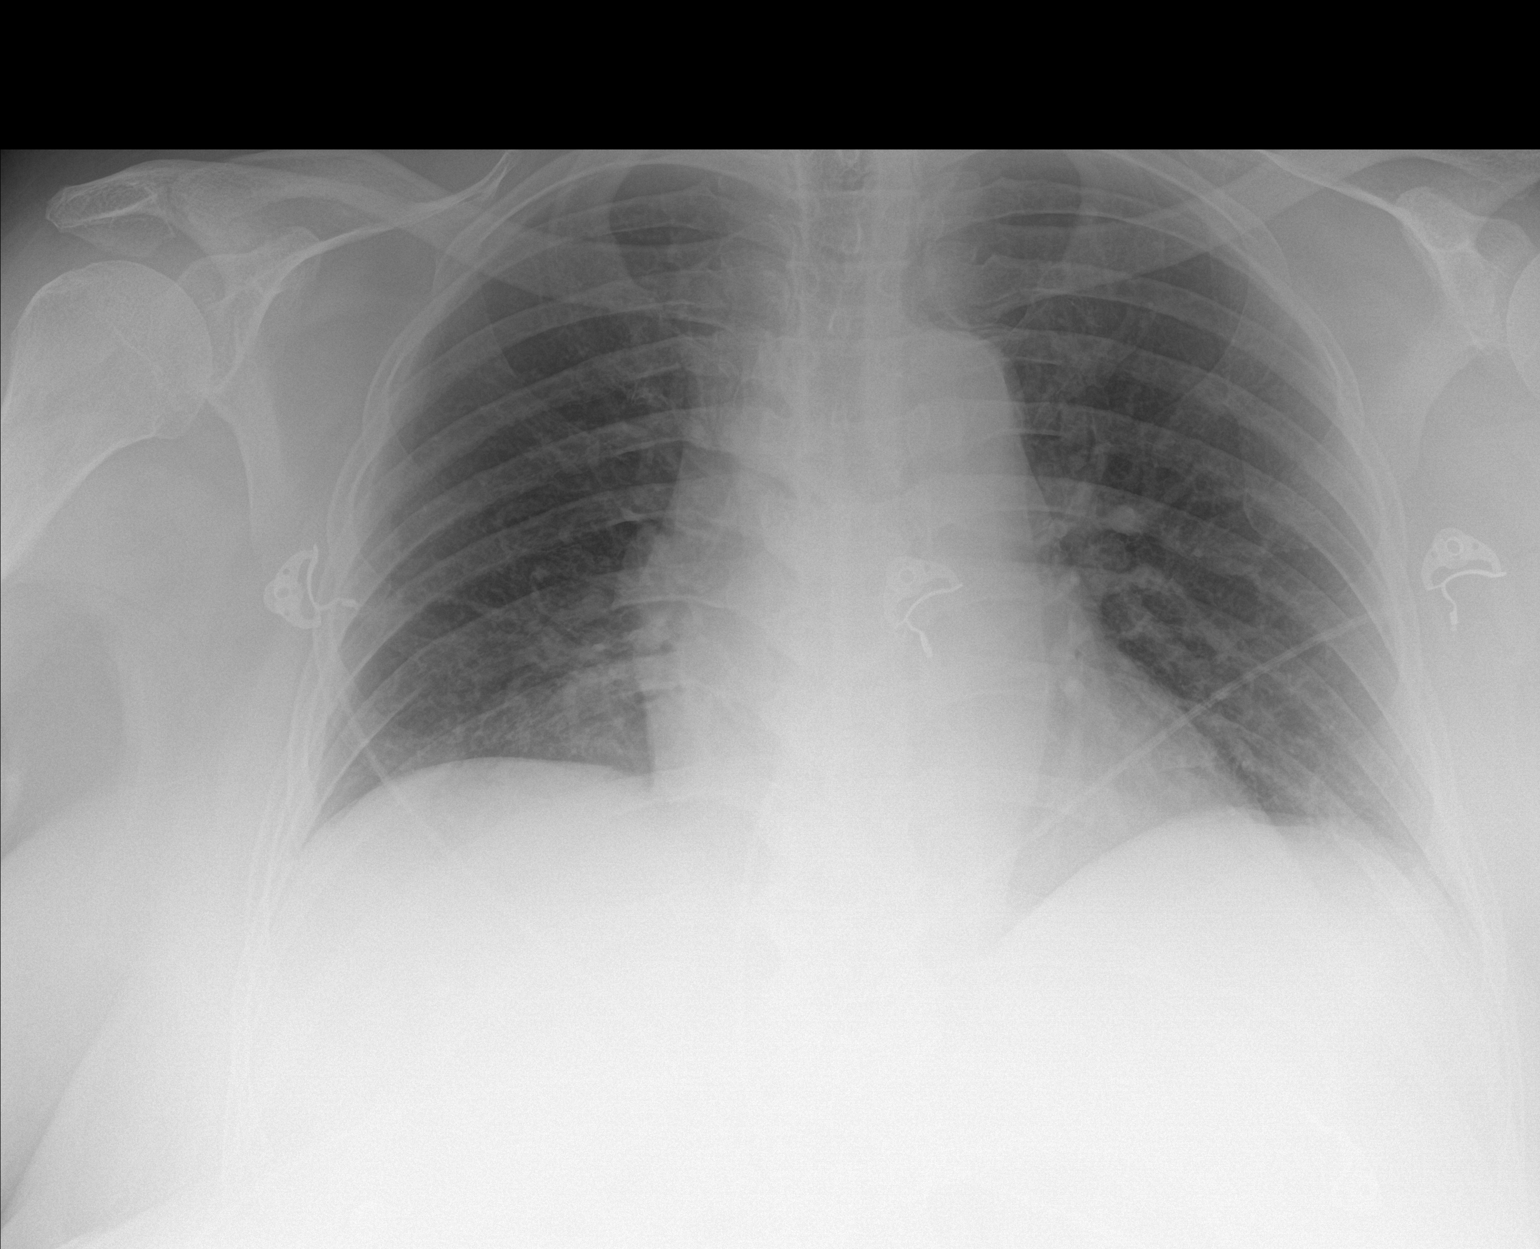

[1 of 1 positions shown; findings below may reference images not displayed]

FINDINGS: Heart is at the upper limits of normal in size. The lungs are clear
bilaterally. No acute bony abnormality is noted.
IMPRESSION: No active disease.
